# Patient Record
Sex: Male | Born: 1980 | Race: White | Hispanic: No | Marital: Married | Smoking: Never smoker
Health system: Southern US, Community
[De-identification: ages and names within clinical notes are randomized; demographics above are authoritative.]

## PROBLEM LIST (undated history)

## (undated) DIAGNOSIS — IMO0002 Reserved for concepts with insufficient information to code with codable children: Secondary | ICD-10-CM

## (undated) DIAGNOSIS — C801 Malignant (primary) neoplasm, unspecified: Secondary | ICD-10-CM

## (undated) DIAGNOSIS — T7840XA Allergy, unspecified, initial encounter: Secondary | ICD-10-CM

## (undated) DIAGNOSIS — C8590 Non-Hodgkin lymphoma, unspecified, unspecified site: Secondary | ICD-10-CM

## (undated) DIAGNOSIS — Z85828 Personal history of other malignant neoplasm of skin: Secondary | ICD-10-CM

## (undated) DIAGNOSIS — E785 Hyperlipidemia, unspecified: Secondary | ICD-10-CM

## (undated) HISTORY — PX: COLONOSCOPY: SHX174

## (undated) HISTORY — DX: Malignant (primary) neoplasm, unspecified: C80.1

## (undated) HISTORY — DX: Non-Hodgkin lymphoma, unspecified, unspecified site: C85.90

## (undated) HISTORY — DX: Allergy, unspecified, initial encounter: T78.40XA

## (undated) HISTORY — DX: Reserved for concepts with insufficient information to code with codable children: IMO0002

## (undated) HISTORY — DX: Personal history of other malignant neoplasm of skin: Z85.828

## (undated) HISTORY — DX: Hyperlipidemia, unspecified: E78.5

## (undated) HISTORY — PX: VASECTOMY: SHX75

---

## 2009-10-24 ENCOUNTER — Encounter (INDEPENDENT_AMBULATORY_CARE_PROVIDER_SITE_OTHER): Payer: Self-pay | Admitting: *Deleted

## 2009-12-07 ENCOUNTER — Encounter (INDEPENDENT_AMBULATORY_CARE_PROVIDER_SITE_OTHER): Payer: Self-pay | Admitting: *Deleted

## 2010-05-09 ENCOUNTER — Ambulatory Visit: Payer: Self-pay | Admitting: Internal Medicine

## 2010-09-12 NOTE — Letter (Signed)
Summary: New Patient letter  [facility]  [address] [place], [place] [address]   Phone: [phone]  Fax: [phone]       10/24/2009 MRN: [id number]  Allen Hodge [address] [place], [place]  [address]  Dear [name],  Welcome to the Gastroenterology Division at [facility].    You are scheduled to see Dr. Christella Hartigan on 11/22/2009 at 1:30PM on the 3rd floor at [facility], [address]  We ask that you try to arrive at our office 15 minutes prior to your appointment time to allow for check-in.  We would like you to complete the enclosed self-administered evaluation form prior to your visit and bring it with you on the day of your appointment.  We will review it with you.  Also, please bring a complete list of all your medications or, if you prefer, bring the medication bottles and we will list them.  Please bring your insurance card so that we may make a copy of it.  If your insurance requires a referral to see a specialist, please bring your referral form from your primary care physician.  Co-payments are due at the time of your visit and may be paid by cash, check or credit card.     Your office visit will consist of a consult with your physician (includes a physical exam), any laboratory testing he/she may order, scheduling of any necessary diagnostic testing (e.g. x-ray, ultrasound, CT-scan), and scheduling of a procedure (e.g. Endoscopy, Colonoscopy) if required.  Please allow enough time on your schedule to allow for any/all of these possibilities.    If you cannot keep your appointment, please call [phone] to cancel or reschedule prior to your appointment date.  This allows [place] the opportunity to schedule an appointment for another patient in need of care.  If you do not cancel or reschedule by 5 p.m. the business day prior to your appointment date, you will be charged a $50.00 late cancellation/no-show fee.    Thank you  for choosing [facility] for your medical needs.  We appreciate the opportunity to care for you.  Please visit [place] at our website  to learn more about our practice.                     Sincerely,                                                             The Gastroenterology Division

## 2010-09-12 NOTE — Letter (Signed)
Summary: Appt Reminder 2  [facility]  [address] [place], [place] [address]   Phone: [phone]  Fax: [phone]        December 07, 2009 MRN: [id number]    Allen Hodge [address] [place], [place]  [address]    Dear [name],   You have an appointment with Dr. Lina Sar on February 14, 2010 at 2:45pm.  Please remember to bring a complete list of the medicines you are taking, your insurance card and your co-pay.  If you have to cancel or reschedule this appointment, please call before 5:00 pm the evening before to avoid a cancellation fee.  If you have any questions or concerns, please call [phone].    Sincerely,  The Gastroenterology Division

## 2012-02-27 ENCOUNTER — Encounter: Payer: Self-pay | Admitting: Gastroenterology

## 2012-03-17 ENCOUNTER — Telehealth: Payer: Self-pay | Admitting: Gastroenterology

## 2012-03-17 NOTE — Telephone Encounter (Signed)
Pt was scheduled to see Dr Christella Hartigan on 11/22/09 pt  cx the appt  then was scheduled to see Dr Juanda Chance on 02/14/10 and 11/22/09 and no showed both appt's. Pt is now calling to speak with a nurse about a colon due to family history.  Dr Christella Hartigan should he be on your schedule?

## 2012-03-18 NOTE — Telephone Encounter (Signed)
New GI appt with me to discuss family history, consider colonoscopy.

## 2012-03-18 NOTE — Telephone Encounter (Signed)
Pt has been notified that he can be scheduled with Dr Christella Hartigan to discuss his concerns.  He will call and make that appt at a later date

## 2012-03-26 ENCOUNTER — Encounter: Payer: Self-pay | Admitting: Gastroenterology

## 2012-03-26 ENCOUNTER — Ambulatory Visit (INDEPENDENT_AMBULATORY_CARE_PROVIDER_SITE_OTHER): Payer: BC Managed Care – PPO | Admitting: Gastroenterology

## 2012-03-26 ENCOUNTER — Ambulatory Visit: Payer: Self-pay | Admitting: Gastroenterology

## 2012-03-26 VITALS — BP 128/74 | HR 62 | Ht 74.0 in | Wt 219.0 lb

## 2012-03-26 DIAGNOSIS — R194 Change in bowel habit: Secondary | ICD-10-CM

## 2012-03-26 DIAGNOSIS — R198 Other specified symptoms and signs involving the digestive system and abdomen: Secondary | ICD-10-CM

## 2012-03-26 DIAGNOSIS — Z800 Family history of malignant neoplasm of digestive organs: Secondary | ICD-10-CM | POA: Insufficient documentation

## 2012-03-26 MED ORDER — MOVIPREP 100 G PO SOLR: 1.0000 | ORAL | Status: DC

## 2012-03-26 NOTE — Patient Instructions (Addendum)
You will be set up for a colonoscopy for change in bowels, family history of colon cancer.

## 2012-03-26 NOTE — Progress Notes (Signed)
HPI: This is a    very pleasant 31 year old man whom I am meeting for the first time today.  His mother had colon cancer at age 81 died from it.  Paternal GF also had colon cancer also.   HE has had trouble with constipation, abd discomfort and gas pains.  LUQ gas like pains.    Constipation is definitely new for him.  For two months.  Never sees blood in stool.  Has been reading on internet.  Overall stable weight.  Daughter 38 years old.  No nsaids, no nausea, vomiting.  Also a sinking feeling in stomach, sort of a turning feeling.  Intermittent pyrosis.  No dysphagia.    Review of systems: Pertinent positive and negative review of systems were noted in the above HPI section. Complete review of systems was performed and was otherwise normal.    Past Medical History  Diagnosis Date  . Herniated disc   . GERD (gastroesophageal reflux disease)   . Hx of skin cancer, basal cell     No past surgical history on file.  Current Outpatient Prescriptions  Medication Sig Dispense Refill  . VITAMIN E PO Take 1 capsule by mouth daily.        Allergies as of 03/26/2012  . (No Known Allergies)    Family History  Problem Relation Age of Onset  . Colon cancer Mother 109  . Colon cancer Paternal Grandfather   . Colon polyps Mother   . Prostate cancer Maternal Grandfather   . Breast cancer Maternal Aunt   . Diabetes Maternal Grandmother     History   Social History  . Marital Status: Married    Spouse Name: N/A    Number of Children: 1  . Years of Education: N/A   Occupational History  . Sales     Social History Main Topics  . Smoking status: Never Smoker   . Smokeless tobacco: Never Used  . Alcohol Use: Yes     Rare   . Drug Use: No  . Sexually Active: Not on file   Other Topics Concern  . Not on file   Social History Narrative   Daily caffeine        Physical Exam: BP 128/74  Pulse 62  Ht 6\' 2"  (1.88 m)  Wt 219 lb (99.338 kg)  BMI 28.12  kg/m2 Constitutional: generally well-appearing Psychiatric: alert and oriented x3 Eyes: extraocular movements intact Mouth: oral pharynx moist, no lesions Neck: supple no lymphadenopathy Cardiovascular: heart regular rate and rhythm Lungs: clear to auscultation bilaterally Abdomen: soft, nontender, nondistended, no obvious ascites, no peritoneal signs, normal bowel sounds Extremities: no lower extremity edema bilaterally Skin: no lesions on visible extremities    Assessment and plan: 31 y.o. male with  elevated risk for colon cancer, recent change in bowel habits  I think it is unlikely that he has underlying colon cancer however with his family history and his change in bowel habits we should proceed with colonoscopy at his soonest convenience. He understands the potential risks, benefits of the procedure and wishes to proceed.

## 2012-05-02 ENCOUNTER — Other Ambulatory Visit: Payer: BC Managed Care – PPO | Admitting: Gastroenterology

## 2012-05-20 ENCOUNTER — Ambulatory Visit (AMBULATORY_SURGERY_CENTER): Payer: BC Managed Care – PPO | Admitting: Gastroenterology

## 2012-05-20 ENCOUNTER — Encounter: Payer: Self-pay | Admitting: Gastroenterology

## 2012-05-20 VITALS — BP 117/61 | HR 68 | Temp 98.1°F | Resp 13 | Ht 74.0 in | Wt 219.0 lb

## 2012-05-20 DIAGNOSIS — K5900 Constipation, unspecified: Secondary | ICD-10-CM

## 2012-05-20 DIAGNOSIS — Z800 Family history of malignant neoplasm of digestive organs: Secondary | ICD-10-CM

## 2012-05-20 DIAGNOSIS — R198 Other specified symptoms and signs involving the digestive system and abdomen: Secondary | ICD-10-CM

## 2012-05-20 MED ORDER — SODIUM CHLORIDE 0.9 % IV SOLN: 500.0000 mL | INTRAVENOUS | Status: DC

## 2012-05-20 NOTE — Op Note (Signed)
[facility] [address] [place], [address]   COLONOSCOPY PROCEDURE REPORT  PATIENT: Allen Hodge, Allen Hodge  MR#: [id number] BIRTHDATE: 27-Jul-1981 , 31  yrs. old GENDER: Male ENDOSCOPIST: Rachael Fee, MD REFERRED QI:ONGE Larwance Sachs, M.D. PROCEDURE DATE:  05/20/2012 PROCEDURE:   Colonoscopy, diagnostic ASA CLASS:   Class I INDICATIONS:elevated risk screening and mother with colon cancer, constipation. MEDICATIONS: Fentanyl 100 mcg IV, Versed 8 mg IV, and These medications were titrated to patient response per physician's verbal order  DESCRIPTION OF PROCEDURE:   After the risks benefits and alternatives of the procedure were thoroughly explained, informed consent was obtained.  A digital rectal exam revealed no abnormalities of the rectum.   The LB [id number]-[id number]  endoscope was introduced through the anus and advanced to the cecum, which was identified by both the appendix and ileocecal valve. No adverse events experienced.   The quality of the prep was good.  The instrument was then slowly withdrawn as the colon was fully examined.   COLON FINDINGS: A normal appearing cecum, ileocecal valve, and appendiceal orifice were identified.  The ascending, hepatic flexure, transverse, splenic flexure, descending, sigmoid colon and rectum appeared unremarkable.  No polyps or cancers were seen. Retroflexed views revealed no abnormalities. The time to cecum=5 minutes 28 seconds.  Withdrawal time=6 minutes 13 seconds.  The scope was withdrawn and the procedure completed. COMPLICATIONS: There were no complications.  ENDOSCOPIC IMPRESSION: Normal colon  RECOMMENDATIONS: Given your significant family history of colon cancer, you should have a repeat colonoscopy in 5 years   eSigned:  Rachael Fee, MD 05/20/2012 2:16 PM   c

## 2012-05-20 NOTE — Progress Notes (Signed)
The pt. Tolerated the colonoscopy well. Maw

## 2012-05-20 NOTE — Patient Instructions (Addendum)
YOU HAD AN ENDOSCOPIC PROCEDURE TODAY AT THE [facility]: Refer to the procedure report that was given to you for any specific questions about what was found during the examination.  If the procedure report does not answer your questions, please call your gastroenterologist to clarify.  If you requested that your care partner not be given the details of your procedure findings, then the procedure report has been included in a sealed envelope for you to review at your convenience later.  YOU SHOULD EXPECT: Some feelings of bloating in the abdomen. Passage of more gas than usual.  Walking can help get rid of the air that was put into your GI tract during the procedure and reduce the bloating. If you had a lower endoscopy (such as a colonoscopy or flexible sigmoidoscopy) you may notice spotting of blood in your stool or on the toilet paper. If you underwent a bowel prep for your procedure, then you may not have a normal bowel movement for a few days.  DIET: Your first meal following the procedure should be a light meal and then it is ok to progress to your normal diet.  A half-sandwich or bowl of soup is an example of a good first meal.  Heavy or fried foods are harder to digest and may make you feel nauseous or bloated.  Likewise meals heavy in dairy and vegetables can cause extra gas to form and this can also increase the bloating.  Drink plenty of fluids but you should avoid alcoholic beverages for 24 hours.  ACTIVITY: Your care partner should take you home directly after the procedure.  You should plan to take it easy, moving slowly for the rest of the day.  You can resume normal activity the day after the procedure however you should NOT DRIVE or use heavy machinery for 24 hours (because of the sedation medicines used during the test).    SYMPTOMS TO REPORT IMMEDIATELY: A gastroenterologist can be reached at any hour.  During normal business hours, 8:30 AM to 5:00 PM Monday through Friday,  call [phone].  After hours and on weekends, please call the GI answering service at [phone] who will take a message and have the physician on call contact you.   Following lower endoscopy (colonoscopy or flexible sigmoidoscopy):  Excessive amounts of blood in the stool  Significant tenderness or worsening of abdominal pains  Swelling of the abdomen that is new, acute  Fever of 100F or higher  FOLLOW UP: If any biopsies were taken you will be contacted by phone or by letter within the next 1-3 weeks.  Call your gastroenterologist if you have not heard about the biopsies in 3 weeks.  Our staff will call the home number listed on your records the next business day following your procedure to check on you and address any questions or concerns that you may have at that time regarding the information given to you following your procedure. This is a courtesy call and so if there is no answer at the home number and we have not heard from you through the emergency physician on call, we will assume that you have returned to your regular daily activities without incident.  SIGNATURES/CONFIDENTIALITY: You and/or your care partner have signed paperwork which will be entered into your electronic medical record.  These signatures attest to the fact that that the information above on your After Visit Summary has been reviewed and is understood.  Full responsibility of the confidentiality of this   discharge information lies with you and/or your care-partner.  Resume medications. 

## 2012-05-20 NOTE — Progress Notes (Signed)
The pt had abdominal cramping with the scope advancement.  Medications were titrated per the MD's orders.  Once the cecum was reached the pt relaxed and rested comfortable.  Maw

## 2012-05-20 NOTE — Progress Notes (Signed)
Patient did not experience any of the following events: a burn prior to discharge; a fall within the facility; wrong site/side/patient/procedure/implant event; or a hospital transfer or hospital admission upon discharge from the facility. (G8907) Patient did not have preoperative order for IV antibiotic SSI prophylaxis. (G8918)  

## 2012-05-21 ENCOUNTER — Telehealth: Payer: Self-pay

## 2012-05-21 NOTE — Telephone Encounter (Signed)
  Follow up Call-  Call back number 05/20/2012  Post procedure Call Back phone  # [phone]  Permission to leave phone message Yes     Patient questions:  Do you have a fever, pain , or abdominal swelling? no Pain Score  0 *  Have you tolerated food without any problems? yes  Have you been able to return to your normal activities? yes  Do you have any questions about your discharge instructions: Diet   no Medications  no Follow up visit  no  Do you have questions or concerns about your Care? no  Actions: * If pain score is 4 or above: No action needed, pain <4.   Per the pt "everything went fine". Maw

## 2013-09-16 ENCOUNTER — Other Ambulatory Visit: Payer: Self-pay | Admitting: Cardiology

## 2013-09-16 ENCOUNTER — Ambulatory Visit
Admission: RE | Admit: 2013-09-16 | Discharge: 2013-09-16 | Disposition: A | Discharge status: Home / Self Care | Payer: 59 | Source: Ambulatory Visit | Attending: Cardiology | Admitting: Cardiology

## 2013-09-16 DIAGNOSIS — R0789 Other chest pain: Secondary | ICD-10-CM

## 2016-02-15 ENCOUNTER — Emergency Department (HOSPITAL_COMMUNITY): Payer: 59

## 2016-02-15 ENCOUNTER — Emergency Department (HOSPITAL_COMMUNITY)
Admission: EM | Admit: 2016-02-15 | Discharge: 2016-02-15 | Disposition: A | Discharge status: Home / Self Care | Payer: 59 | Attending: Emergency Medicine | Admitting: Emergency Medicine

## 2016-02-15 ENCOUNTER — Encounter (HOSPITAL_COMMUNITY): Payer: Self-pay | Admitting: Emergency Medicine

## 2016-02-15 DIAGNOSIS — R200 Anesthesia of skin: Secondary | ICD-10-CM

## 2016-02-15 DIAGNOSIS — Z85828 Personal history of other malignant neoplasm of skin: Secondary | ICD-10-CM | POA: Insufficient documentation

## 2016-02-15 DIAGNOSIS — R202 Paresthesia of skin: Secondary | ICD-10-CM
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
## 2016-02-15 NOTE — Discharge Instructions (Signed)
Paresthesia Paresthesia is an abnormal burning or prickling sensation. This sensation is generally felt in the hands, arms, legs, or feet. However, it may occur in any part of the body. Usually, it is not painful. The feeling may be described as:  Tingling or numbness.  Pins and needles.  Skin crawling.  Buzzing.  Limbs falling asleep.  Itching. Most people experience temporary (transient) paresthesia at some time in their lives. Paresthesia may occur when you breathe too quickly (hyperventilation). It can also occur without any apparent cause. Commonly, paresthesia occurs when pressure is placed on a nerve. The sensation quickly goes away after the pressure is removed. For some people, however, paresthesia is a long-lasting (chronic) condition that is caused by an underlying disorder. If you continue to have paresthesia, you may need further medical evaluation. HOME CARE INSTRUCTIONS Watch your condition for any changes. Taking the following actions may help to lessen any discomfort that you are feeling:  Avoid drinking alcohol.  Try acupuncture or massage to help relieve your symptoms.  Keep all follow-up visits as directed by your health care provider. This is important. SEEK MEDICAL CARE IF:  You continue to have episodes of paresthesia.  Your burning or prickling feeling gets worse when you walk.  You have pain, cramps, or dizziness.  You develop a rash. SEEK IMMEDIATE MEDICAL CARE IF:  You feel weak.  You have trouble walking or moving.  You have problems with speech, understanding, or vision.  You feel confused.  You cannot control your bladder or bowel movements.  You have numbness after an injury.  You faint.   This information is not intended to replace advice given to you by your health care provider. Make sure you discuss any questions you have with your health care provider.   Follow up with your primary care provider for re-evaluation. Return to the Ed  if you experience severe worsening of your symptoms, severe eye pain or change in vision, fevers, weakness, slurred speech or loss of consciousness.

## 2016-02-15 NOTE — ED Notes (Signed)
Pt taken to CT scan.
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
## 2016-02-15 NOTE — ED Notes (Signed)
Patient has returned from CT, and walked to bathroom without assist.

## 2016-02-15 NOTE — ED Notes (Signed)
Neuro rounding in room.
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
## 2016-02-20 ENCOUNTER — Emergency Department (HOSPITAL_COMMUNITY)
Admission: EM | Admit: 2016-02-20 | Discharge: 2016-02-20 | Disposition: A | Discharge status: Home / Self Care | Payer: 59 | Attending: Emergency Medicine | Admitting: Emergency Medicine

## 2016-02-20 ENCOUNTER — Encounter (HOSPITAL_COMMUNITY): Payer: Self-pay

## 2016-02-20 ENCOUNTER — Emergency Department (HOSPITAL_COMMUNITY): Payer: 59

## 2016-02-20 DIAGNOSIS — R002 Palpitations: Secondary | ICD-10-CM | POA: Diagnosis not present

## 2016-02-20 DIAGNOSIS — R202 Paresthesia of skin: Secondary | ICD-10-CM

## 2016-02-20 DIAGNOSIS — R200 Anesthesia of skin: Secondary | ICD-10-CM

## 2016-02-20 DIAGNOSIS — R0602 Shortness of breath: Secondary | ICD-10-CM | POA: Insufficient documentation

## 2016-02-20 LAB — HEPATIC FUNCTION PANEL
ALBUMIN: 4.9 g/dL (ref 3.5–5.0)
ALK PHOS: 39 U/L (ref 38–126)
ALT: 18 U/L (ref 17–63)
AST: 20 U/L (ref 15–41)
BILIRUBIN INDIRECT: 0.8 mg/dL (ref 0.3–0.9)
BILIRUBIN TOTAL: 0.9 mg/dL (ref 0.3–1.2)
Bilirubin, Direct: 0.1 mg/dL (ref 0.1–0.5)
TOTAL PROTEIN: 7.6 g/dL (ref 6.5–8.1)

## 2016-02-20 LAB — TSH: TSH: 1.03 u[IU]/mL (ref 0.350–4.500)

## 2016-02-20 LAB — URINALYSIS, ROUTINE W REFLEX MICROSCOPIC
BILIRUBIN URINE: NEGATIVE
GLUCOSE, UA: NEGATIVE mg/dL
Hgb urine dipstick: NEGATIVE
KETONES UR: NEGATIVE mg/dL
Leukocytes, UA: NEGATIVE
NITRITE: NEGATIVE
PH: 7 (ref 5.0–8.0)
Protein, ur: NEGATIVE mg/dL
Specific Gravity, Urine: 1.009 (ref 1.005–1.030)

## 2016-02-20 LAB — CBC
HEMATOCRIT: 46.7 % (ref 39.0–52.0)
Hemoglobin: 15.6 g/dL (ref 13.0–17.0)
MCH: 29.5 pg (ref 26.0–34.0)
MCHC: 33.4 g/dL (ref 30.0–36.0)
MCV: 88.4 fL (ref 78.0–100.0)
Platelets: 238 10*3/uL (ref 150–400)
RBC: 5.28 MIL/uL (ref 4.22–5.81)
RDW: 13.1 % (ref 11.5–15.5)
WBC: 7 10*3/uL (ref 4.0–10.5)

## 2016-02-20 LAB — BASIC METABOLIC PANEL
Anion gap: 7 (ref 5–15)
BUN: 11 mg/dL (ref 6–20)
CO2: 27 mmol/L (ref 22–32)
Calcium: 10.1 mg/dL (ref 8.9–10.3)
Chloride: 104 mmol/L (ref 101–111)
Creatinine, Ser: 1.01 mg/dL (ref 0.61–1.24)
GFR calc Af Amer: >60 mL/min (ref >60)
GLUCOSE: 100 mg/dL — AB (ref 65–99)
POTASSIUM: 4.1 mmol/L (ref 3.5–5.1)
Sodium: 138 mmol/L (ref 135–145)

## 2016-02-20 LAB — T4, FREE: Free T4: 1.01 ng/dL (ref 0.61–1.12)

## 2016-02-20 LAB — I-STAT TROPONIN, ED: Troponin i, poc: 0 ng/mL (ref 0.00–0.08)

## 2016-02-20 LAB — CK: Total CK: 112 U/L (ref 49–397)

## 2016-02-20 LAB — MAGNESIUM: MAGNESIUM: 2.1 mg/dL (ref 1.7–2.4)

## 2016-02-20 LAB — LIPASE, BLOOD: Lipase: 52 U/L — ABNORMAL HIGH (ref 11–51)

## 2016-02-20 NOTE — ED Notes (Signed)
Patient complains of ongoing body numbness and palpitations since visit 7/5. Has ben see her for same, has been at urgent care for same. Wants work-up this time. On arrival NAD. Speaking complete sentences, steady gait into triage. Alert and oriernted

## 2016-02-20 NOTE — ED Notes (Signed)
Gave pt [place] sandwich and applesauce, per [place] - RN.

## 2016-02-20 NOTE — ED Provider Notes (Signed)
CSN: [id number]     Arrival date & time 02/20/16  1013 History   First MD Initiated Contact with Patient 02/20/16 1037     Chief Complaint  Patient presents with  . body numbness   . Palpitations     (Consider location/radiation/quality/duration/timing/severity/associated sxs/prior Treatment) HPI  35 year old male with no significant medical history presents with concern for full body loss of pain sensation, episodes of intermittent numbness, headache, brain fog, palpitations, shortness of breath, chest tightness. Patient was seen on July 5 for symptoms of loss of pain sensation to his whole body. It he had a neurology evaluation, with recommendation of obtaining outpatient B12, TSH, magnesium and calcium levels, and low suspicion for acute intracranial or spinal pathology given distribution.   Patient with list of symptoms that he reads from his phone:  Yesterday heart palpitations and today, consistent yesterday, come and go, even notice when laying down.  Shortness of breath this AM, comes and goes. Nothing makes it better or worse. Comes and goes randomly.  Tightness in chest comes and goes since yesterday, problbably had it before but noticed yesterday. Not changed by positions, exertion.  Still has feeling of numbness diffusely, all over his body. Went into walk in clinic.  Tightness in chest, tick bite back in April, difficulty swallowing today while drinking water, feels numbness in throat, fatigue, slight motor changes-walking differently, feels like fingers on both sides are not working normally. Saturday left leg was asleep all day.  Labored breathing. Headache. Brain fog at times, feels sharp at times.  Nose bleeds. Wobbly legs. Waking up tired. Slurred speech. Always been healthy.  Numbness has been main thing, consistent thing, everywhere.  Nosebleed out of left nostril. Has had nose itching.  No new medicines.  Taking B12 supplements   Went to walk in clinic and had lab work but does  not really have a PCP.  Have been under stress but tried to relax for last few days.     Sensitivity to gluten, weeks ago had sharp stabbing pain which went away, recently not having pain with gluten, not sure if related to numbness.     Past Medical History  Diagnosis Date  . Herniated disc   . Hx of skin cancer, basal cell   . Allergy    History reviewed. No pertinent past surgical history. Family History  Problem Relation Age of Onset  . Colon cancer Mother 59  . Colon polyps Mother   . Colon cancer Paternal Grandfather   . Prostate cancer Maternal Grandfather   . Breast cancer Maternal Aunt   . Diabetes Maternal Grandmother    Social History  Substance Use Topics  . Smoking status: Never Smoker   . Smokeless tobacco: Never Used  . Alcohol Use: 1.2 oz/week    2 Cans of beer per week     Comment: Rare     Review of Systems  Constitutional: Positive for activity change and fatigue. Negative for fever.  HENT: Negative for sore throat.   Eyes: Negative for visual disturbance.  Respiratory: Positive for shortness of breath. Negative for cough.   Cardiovascular: Positive for chest pain.  Gastrointestinal: Negative for abdominal pain.  Genitourinary: Negative for difficulty urinating.  Musculoskeletal: Negative for back pain and neck stiffness. Myalgias: muscles feel tight.  Skin: Negative for rash.  Neurological: Positive for speech difficulty, numbness and headaches. Negative for syncope, facial asymmetry and weakness.      Allergies  Review of patient's allergies indicates no known allergies.  Home Medications   Prior to Admission medications   Medication Sig Start Date End Date Taking? Authorizing Provider  acetaminophen (TYLENOL) 500 MG tablet Take 1,000 mg by mouth every 6 (six) hours as needed for mild pain.   Yes Historical Provider, MD  Cyanocobalamin ([facility] B-12 PO) Take 1 tablet by mouth daily.   Yes Historical Provider, MD  ibuprofen (ADVIL,MOTRIN) 200 MG  tablet Take 200 mg by mouth every 6 (six) hours as needed.   Yes Historical Provider, MD  Multiple Vitamins-Minerals (MULTIVITAMIN WITH MINERALS) tablet Take 1 tablet by mouth daily.   Yes Historical Provider, MD   BP 110/77 mmHg  Pulse 62  Temp(Src) 98.1 F (36.7 C) (Oral)  Resp 18  Ht 6\' 2"  (1.88 m)  Wt 200 lb (90.719 kg)  BMI 25.67 kg/m2  SpO2 100% Physical Exam  Constitutional: He is oriented to person, place, and time. He appears well-developed and well-nourished. No distress.  HENT:  Head: Normocephalic and atraumatic.  Mouth/Throat: No oropharyngeal exudate.  Eyes: Conjunctivae and EOM are normal. Pupils are equal, round, and reactive to light.  Neck: Normal range of motion.  Cardiovascular: Normal rate, regular rhythm, normal heart sounds and intact distal pulses.  Exam reveals no gallop and no friction rub.   No murmur heard. Pulmonary/Chest: Effort normal and breath sounds normal. No respiratory distress. He has no wheezes. He has no rales.  Abdominal: Soft. He exhibits no distension. There is no tenderness. There is no guarding.  Musculoskeletal: He exhibits no edema.  Neurological: He is alert and oriented to person, place, and time. He has normal strength. A sensory deficit (reports normal sensation to light touch, however denies pain sensation) is present. No cranial nerve deficit. Coordination and gait normal. GCS eye subscore is 4. GCS verbal subscore is 5. GCS motor subscore is 6.  Reflex Scores:      Bicep reflexes are 3+ on the right side and 3+ on the left side.      Patellar reflexes are 3+ on the right side and 3+ on the left side. Skin: Skin is warm and dry. He is not diaphoretic.  Nursing note and vitals reviewed.   ED Course  Procedures (including critical care time) Labs Review Labs Reviewed  BASIC METABOLIC PANEL - Abnormal; Notable for the following:    Glucose, Bld 100 (*)    All other components within normal limits  LIPASE, BLOOD - Abnormal;  Notable for the following:    Lipase 52 (*)    All other components within normal limits  CBC  URINALYSIS, ROUTINE W REFLEX MICROSCOPIC (NOT AT [facility])  TSH  MAGNESIUM  T4, FREE  CK  HEPATIC FUNCTION PANEL  B. BURGDORFI ANTIBODIES  I-STAT TROPOININ, ED    Imaging Review Dg Chest 2 View  02/20/2016  CLINICAL DATA:  Cardiac palpitations EXAM: CHEST  2 VIEW COMPARISON:  September 16, 2013 FINDINGS: There is no edema or consolidation. The heart size and pulmonary vascularity are normal. No adenopathy. No bone lesions. IMPRESSION: No edema or consolidation. Electronically Signed   By: Lowella Grip III M.D.   On: 02/20/2016 12:51   I have personally reviewed and evaluated these images and lab results as part of my medical decision-making.   EKG Interpretation   Date/Time:  Monday February 20 2016 10:19:57 EDT Ventricular Rate:  86 PR Interval:  132 QRS Duration: 94 QT Interval:  356 QTC Calculation: 426 R Axis:   96 Text Interpretation:  Normal sinus rhythm with sinus arrhythmia  Rightward  axis Incomplete right bundle branch block Borderline ECG No previous ECGs  available Confirmed by [facility] MD, Miia Blanks ([id number]) on 02/20/2016 1:38:51 PM      MDM   Final diagnoses:  Numbness and tingling  Palpitations  Shortness of breath   35 year old male with no significant medical history presents with concern for full body loss of pain sensation, episodes of intermittent numbness, headache, brain fog, palpitations, shortness of breath, chest tightness. Patient was seen on July 5 for symptoms of loss of pain sensation to his whole body. It he had a neurology evaluation, with recommendation of obtaining outpatient B12, TSH, magnesium and calcium levels, and low suspicion for acute intracranial or spinal pathology given distribution. Patient does not have a primary care physician, and presents again to the emergency department with these symptoms along with additional symptoms of shortness of  breath chest tightness and palpitations. EKG was about a by me and showed an incomplete right bundle branch block without other abnormalities and no prior for comparison. Chest x-ray shows no sign of acute abnormality. Troponin was negative. TSH, Ca, magnesium were ordered and were within normal limits.  Given muscle aches checked CK which was normal.  Have low suspicion for pulmonary embolus as etiology symptoms, and given multiple body systems described as symptoms, and patient is also PERC negative.  Discussed with patient that have low suspicion for acute pathology including stroke, spinal cord lesion, heart attack, PE.  No sign of GBS, myesthenia gravis, doubt ALS.    Discussed that symptoms he is describing may be attributed to stress and anxiety, and while he should continue outpatient evaluation, also recommend stress relief which he reports he is pursuing at this time. Patient asking for lyme testing. Discussed that overall I do not feel his symptoms are consistent with Lyme disease, and that testing for antibodies may result in a false positive that may not explain his symptoms, however patient would like to pursue this testing.  Will give number for outpt Neurology for further evaluation and recommend PCP follow up.       Gareth Morgan, MD 02/20/16 2025

## 2016-02-20 NOTE — ED Notes (Signed)
Placed patient on monitor into a gown

## 2016-02-21 LAB — B. BURGDORFI ANTIBODIES: B burgdorferi Ab IgG+IgM: <0.91 {ISR} (ref 0.00–0.90)

## 2016-02-22 ENCOUNTER — Telehealth: Payer: Self-pay | Admitting: Neurology

## 2016-02-22 ENCOUNTER — Other Ambulatory Visit: Payer: Self-pay | Admitting: Nurse Practitioner

## 2016-02-22 DIAGNOSIS — S060X0A Concussion without loss of consciousness, initial encounter: Secondary | ICD-10-CM

## 2016-02-22 NOTE — Telephone Encounter (Signed)
Diane: I got a call about this patient. He needs to be seen in outpatient in our practice. He was seen in the emergency room here at [facility] by neurology. I am on vacation the next 2 weeks so please schedule him with a different provider since I'm not in the office. His phone number is [phone]. Terrence Dupont, can you make sure that Shauna Hugh gets this or Inez Catalina gets this please thank you. thanks

## 2016-02-22 NOTE — Telephone Encounter (Signed)
Gave information to Diane G. In medical records to call patient to schedule appt.

## 2016-02-24 ENCOUNTER — Ambulatory Visit (INDEPENDENT_AMBULATORY_CARE_PROVIDER_SITE_OTHER): Payer: 59 | Admitting: Neurology

## 2016-02-24 ENCOUNTER — Encounter: Payer: Self-pay | Admitting: Neurology

## 2016-02-24 VITALS — BP 110/77 | HR 90 | Ht 74.0 in | Wt 200.8 lb

## 2016-02-24 DIAGNOSIS — R200 Anesthesia of skin: Secondary | ICD-10-CM

## 2016-02-24 DIAGNOSIS — F411 Generalized anxiety disorder: Secondary | ICD-10-CM | POA: Diagnosis not present

## 2016-02-24 NOTE — Patient Instructions (Signed)
-   your symptoms more consistent with stress and anxiety - try to relax and de-stress, which will help the most - will refer to psychology for stress management.  - let us know if you have additional symptoms and we may consider MRI at that time - follow up in 2 months
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
## 2016-04-04 NOTE — Progress Notes (Signed)
Receive fax from [facility] that pt decline to schedule appt at this time.

## 2016-04-30 ENCOUNTER — Ambulatory Visit (INDEPENDENT_AMBULATORY_CARE_PROVIDER_SITE_OTHER): Payer: 59 | Admitting: Neurology

## 2016-04-30 ENCOUNTER — Telehealth: Payer: Self-pay | Admitting: Neurology

## 2016-04-30 ENCOUNTER — Encounter: Payer: Self-pay | Admitting: Neurology

## 2016-04-30 VITALS — BP 101/65 | HR 69 | Ht 74.0 in | Wt 199.0 lb

## 2016-04-30 DIAGNOSIS — R200 Anesthesia of skin: Secondary | ICD-10-CM

## 2016-04-30 DIAGNOSIS — F411 Generalized anxiety disorder: Secondary | ICD-10-CM

## 2016-04-30 NOTE — Patient Instructions (Addendum)
-   symptoms more consistent with stress and anxiety - relaxation and de-stress, which will help the most - MRI brain with and without contrast  - will do nerve conduction study - consider psychology referral if needed in the future. - follow up in 3 months.
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
## 2016-05-03 ENCOUNTER — Telehealth: Payer: Self-pay | Admitting: Neurology

## 2016-05-03 NOTE — Telephone Encounter (Signed)
Sandi, would you mind calling this patient to let him know his MRI has been sent to [facility]?

## 2016-05-08 NOTE — Telephone Encounter (Signed)
Noted  

## 2016-06-07 ENCOUNTER — Encounter: Payer: 59 | Admitting: Diagnostic Neuroimaging

## 2016-07-12 ENCOUNTER — Ambulatory Visit: Payer: 59 | Admitting: Neurology

## 2017-06-17 ENCOUNTER — Encounter: Payer: Self-pay | Admitting: Gastroenterology
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
## 2018-08-20 ENCOUNTER — Encounter: Payer: Self-pay | Admitting: Gastroenterology

## 2018-09-08 ENCOUNTER — Ambulatory Visit (AMBULATORY_SURGERY_CENTER): Payer: Self-pay | Admitting: *Deleted

## 2018-09-08 VITALS — Ht 74.0 in | Wt 189.0 lb

## 2018-09-08 DIAGNOSIS — Z800 Family history of malignant neoplasm of digestive organs: Secondary | ICD-10-CM

## 2018-09-08 MED ORDER — PEG 3350-KCL-NA BICARB-NACL 420 G PO SOLR: 4000.0000 mL | Freq: Once | ORAL | 0 refills | Status: AC

## 2018-09-08 NOTE — Progress Notes (Signed)
No egg or soy allergy known to patient  No issues with past sedation with any surgeries  or procedures, no intubation problems  No diet pills per patient No home 02 use per patient  No blood thinners per patient  Pt denies issues with constipation  No A fib or A flutter  EMMI video sent to pt's e mail pt declined   

## 2018-09-09 ENCOUNTER — Encounter: Payer: Self-pay | Admitting: Gastroenterology

## 2018-09-22 ENCOUNTER — Ambulatory Visit (AMBULATORY_SURGERY_CENTER): Payer: 59 | Admitting: Gastroenterology

## 2018-09-22 ENCOUNTER — Encounter: Payer: Self-pay | Admitting: Gastroenterology

## 2018-09-22 VITALS — BP 101/59 | HR 64 | Temp 97.5°F | Resp 10 | Ht 74.0 in | Wt 189.0 lb

## 2018-09-22 DIAGNOSIS — Z1211 Encounter for screening for malignant neoplasm of colon: Secondary | ICD-10-CM | POA: Diagnosis not present

## 2018-09-22 DIAGNOSIS — D124 Benign neoplasm of descending colon: Secondary | ICD-10-CM | POA: Diagnosis not present

## 2018-09-22 DIAGNOSIS — Z800 Family history of malignant neoplasm of digestive organs: Secondary | ICD-10-CM | POA: Diagnosis present

## 2018-09-22 MED ORDER — SODIUM CHLORIDE 0.9 % IV SOLN: 500.0000 mL | Freq: Once | INTRAVENOUS | Status: DC

## 2018-09-22 NOTE — Op Note (Signed)
[facility] Patient Name: Arben Packman Procedure Date: 09/22/2018 9:56 AM MRN: [id number] Endoscopist: Milus Banister , MD Age: 38 Referring MD:  Date of Birth: [birth year] Gender: Male Account #: [id number] Procedure:                Colonoscopy Indications:              Screening in patient at increased risk: Family                            history of 1st-degree relative with colorectal                            cancer (mother diagnosed around age 80) Medicines:                Monitored Anesthesia Care Procedure:                Pre-Anesthesia Assessment:                           - Prior to the procedure, a History and Physical                            was performed, and patient medications and                            allergies were reviewed. The patient's tolerance of                            previous anesthesia was also reviewed. The risks                            and benefits of the procedure and the sedation                            options and risks were discussed with the patient.                            All questions were answered, and informed consent                            was obtained. Prior Anticoagulants: The patient has                            taken no previous anticoagulant or antiplatelet                            agents. ASA Grade Assessment: II - A patient with                            mild systemic disease. After reviewing the risks                            and benefits, the patient was deemed in  satisfactory condition to undergo the procedure.                           After obtaining informed consent, the colonoscope                            was passed under direct vision. Throughout the                            procedure, the patient's blood pressure, pulse, and                            oxygen saturations were monitored continuously. The                            Colonoscope was  introduced through the anus and                            advanced to the the cecum, identified by                            appendiceal orifice and ileocecal valve. The                            colonoscopy was performed without difficulty. The                            patient tolerated the procedure well. The quality                            of the bowel preparation was good. The ileocecal                            valve, appendiceal orifice, and rectum were                            photographed. Scope In: 10:16:47 AM Scope Out: 10:32:16 AM Scope Withdrawal Time: 0 hours 9 minutes 21 seconds  Total Procedure Duration: 0 hours 15 minutes 29 seconds  Findings:                 A 3 mm polyp was found in the descending colon. The                            polyp was sessile. The polyp was removed with a                            cold snare. Resection and retrieval were complete.                           The exam was otherwise without abnormality on                            direct and retroflexion views. Complications:  No immediate complications. Estimated blood loss:                            None. Estimated Blood Loss:     Estimated blood loss: none. Impression:               - One 3 mm polyp in the descending colon, removed                            with a cold snare. Resected and retrieved.                           - The examination was otherwise normal on direct                            and retroflexion views. Recommendation:           - Patient has a contact number available for                            emergencies. The signs and symptoms of potential                            delayed complications were discussed with the                            patient. Return to normal activities tomorrow.                            Written discharge instructions were provided to the                            patient.                           - Resume previous  diet.                           - Continue present medications.                           You will receive a letter within 2-3 weeks with the                            pathology results and my final recommendations.                           If the polyp(s) is proven to be 'pre-cancerous' on                            pathology, you will need repeat colonoscopy in 5                            years. Milus Banister, MD 09/22/2018 10:34:12 AM This report has been signed electronically.

## 2018-09-22 NOTE — Progress Notes (Signed)
Report given to PACU, vss 

## 2018-09-22 NOTE — Progress Notes (Signed)
Called to room to assist during endoscopic procedure.  Patient ID and intended procedure confirmed with present staff. Received instructions for my participation in the procedure from the performing physician.  

## 2018-09-22 NOTE — Patient Instructions (Signed)
Handout given for polyps.  YOU HAD AN ENDOSCOPIC PROCEDURE TODAY AT THE [facility]:   Refer to the procedure report that was given to you for any specific questions about what was found during the examination.  If the procedure report does not answer your questions, please call your gastroenterologist to clarify.  If you requested that your care partner not be given the details of your procedure findings, then the procedure report has been included in a sealed envelope for you to review at your convenience later.  YOU SHOULD EXPECT: Some feelings of bloating in the abdomen. Passage of more gas than usual.  Walking can help get rid of the air that was put into your GI tract during the procedure and reduce the bloating. If you had a lower endoscopy (such as a colonoscopy or flexible sigmoidoscopy) you may notice spotting of blood in your stool or on the toilet paper. If you underwent a bowel prep for your procedure, you may not have a normal bowel movement for a few days.  Please Note:  You might notice some irritation and congestion in your nose or some drainage.  This is from the oxygen used during your procedure.  There is no need for concern and it should clear up in a day or so.  SYMPTOMS TO REPORT IMMEDIATELY:   Following lower endoscopy (colonoscopy or flexible sigmoidoscopy):  Excessive amounts of blood in the stool  Significant tenderness or worsening of abdominal pains  Swelling of the abdomen that is new, acute  Fever of 100F or higher  For urgent or emergent issues, a gastroenterologist can be reached at any hour by calling [phone].   DIET:  We do recommend a small meal at first, but then you may proceed to your regular diet.  Drink plenty of fluids but you should avoid alcoholic beverages for 24 hours.  ACTIVITY:  You should plan to take it easy for the rest of today and you should NOT DRIVE or use heavy machinery until tomorrow (because of the sedation  medicines used during the test).    FOLLOW UP: Our staff will call the number listed on your records the next business day following your procedure to check on you and address any questions or concerns that you may have regarding the information given to you following your procedure. If we do not reach you, we will leave a message.  However, if you are feeling well and you are not experiencing any problems, there is no need to return our call.  We will assume that you have returned to your regular daily activities without incident.  If any biopsies were taken you will be contacted by phone or by letter within the next 1-3 weeks.  Please call us at [phone] if you have not heard about the biopsies in 3 weeks.    SIGNATURES/CONFIDENTIALITY: You and/or your care partner have signed paperwork which will be entered into your electronic medical record.  These signatures attest to the fact that that the information above on your After Visit Summary has been reviewed and is understood.  Full responsibility of the confidentiality of this discharge information lies with you and/or your care-partner. 

## 2018-09-23 ENCOUNTER — Telehealth: Payer: Self-pay

## 2018-09-23 NOTE — Telephone Encounter (Signed)
  Follow up Call-  Call back number 09/22/2018  Post procedure Call Back phone  # [phone]  Permission to leave phone message Yes  Some recent data might be hidden     Patient questions:  Do you have a fever, pain , or abdominal swelling? No. Pain Score  0 *  Have you tolerated food without any problems? Yes.    Have you been able to return to your normal activities? Yes.    Do you have any questions about your discharge instructions: Diet   No. Medications  No. Follow up visit  No.  Do you have questions or concerns about your Care? No.  Actions: * If pain score is 4 or above: No action needed, pain <4.

## 2018-09-26 ENCOUNTER — Encounter: Payer: Self-pay | Admitting: Gastroenterology

## 2019-06-15 ENCOUNTER — Other Ambulatory Visit: Payer: Self-pay

## 2019-06-15 DIAGNOSIS — Z20822 Contact with and (suspected) exposure to covid-19: Secondary | ICD-10-CM

## 2019-06-16 LAB — NOVEL CORONAVIRUS, NAA: SARS-CoV-2, NAA: NOT DETECTED

## 2021-06-15 ENCOUNTER — Encounter: Payer: Self-pay | Admitting: Physician Assistant

## 2021-06-15 ENCOUNTER — Ambulatory Visit (INDEPENDENT_AMBULATORY_CARE_PROVIDER_SITE_OTHER): Payer: No Typology Code available for payment source | Admitting: Physician Assistant

## 2021-06-15 ENCOUNTER — Other Ambulatory Visit (INDEPENDENT_AMBULATORY_CARE_PROVIDER_SITE_OTHER): Payer: No Typology Code available for payment source

## 2021-06-15 VITALS — BP 110/70 | HR 78 | Ht 74.0 in | Wt 201.2 lb

## 2021-06-15 DIAGNOSIS — R1012 Left upper quadrant pain: Secondary | ICD-10-CM

## 2021-06-15 DIAGNOSIS — R110 Nausea: Secondary | ICD-10-CM

## 2021-06-15 DIAGNOSIS — R1084 Generalized abdominal pain: Secondary | ICD-10-CM

## 2021-06-15 DIAGNOSIS — R1010 Upper abdominal pain, unspecified: Secondary | ICD-10-CM

## 2021-06-15 LAB — CBC WITH DIFFERENTIAL/PLATELET
Basophils Absolute: 0 10*3/uL (ref 0.0–0.1)
Basophils Relative: 0.6 % (ref 0.0–3.0)
Eosinophils Absolute: 0.2 10*3/uL (ref 0.0–0.7)
Eosinophils Relative: 3.4 % (ref 0.0–5.0)
HCT: 40.2 % (ref 39.0–52.0)
Hemoglobin: 13.6 g/dL (ref 13.0–17.0)
Lymphocytes Relative: 29.3 % (ref 12.0–46.0)
Lymphs Abs: 1.9 10*3/uL (ref 0.7–4.0)
MCHC: 33.9 g/dL (ref 30.0–36.0)
MCV: 87.2 fl (ref 78.0–100.0)
Monocytes Absolute: 0.6 10*3/uL (ref 0.1–1.0)
Monocytes Relative: 9.1 % (ref 3.0–12.0)
Neutro Abs: 3.7 10*3/uL (ref 1.4–7.7)
Neutrophils Relative %: 57.6 % (ref 43.0–77.0)
Platelets: 291 10*3/uL (ref 150.0–400.0)
RBC: 4.61 Mil/uL (ref 4.22–5.81)
RDW: 13.2 % (ref 11.5–15.5)
WBC: 6.5 10*3/uL (ref 4.0–10.5)

## 2021-06-15 LAB — COMPREHENSIVE METABOLIC PANEL
ALT: 10 U/L (ref 0–53)
AST: 12 U/L (ref 0–37)
Albumin: 4.4 g/dL (ref 3.5–5.2)
Alkaline Phosphatase: 48 U/L (ref 39–117)
BUN: 13 mg/dL (ref 6–23)
CO2: 28 mEq/L (ref 19–32)
Calcium: 9.6 mg/dL (ref 8.4–10.5)
Chloride: 103 mEq/L (ref 96–112)
Creatinine, Ser: 0.97 mg/dL (ref 0.40–1.50)
GFR: 97.7 mL/min (ref >60.00)
Glucose, Bld: 75 mg/dL (ref 70–99)
Potassium: 4 mEq/L (ref 3.5–5.1)
Sodium: 138 mEq/L (ref 135–145)
Total Bilirubin: 0.3 mg/dL (ref 0.2–1.2)
Total Protein: 7.2 g/dL (ref 6.0–8.3)

## 2021-06-15 LAB — C-REACTIVE PROTEIN: CRP: 2.4 mg/dL (ref 0.5–20.0)

## 2021-06-15 LAB — SEDIMENTATION RATE: Sed Rate: 35 mm/hr — ABNORMAL HIGH (ref 0–15)

## 2021-06-15 LAB — LIPASE: Lipase: 72 U/L — ABNORMAL HIGH (ref 11.0–59.0)

## 2021-06-15 MED ORDER — PANTOPRAZOLE SODIUM 40 MG PO TBEC: 40.0000 mg | DELAYED_RELEASE_TABLET | Freq: Every day | ORAL | 2 refills | Status: DC

## 2021-06-15 NOTE — Patient Instructions (Signed)
If you are age 40 or younger, your body mass index should be between 19-25. Your Body mass index is 25.83 kg/m. If this is out of the aformentioned range listed, please consider follow up with your Primary Care Provider.  ________________________________________________________  The [facility] providers would like to encourage you to use [facility] to communicate with providers for non-urgent requests or questions.  Due to long hold times on the telephone, sending your provider a message by [facility] may be a faster and more efficient way to get a response.  Please allow 48 business hours for a response.  Please remember that this is for non-urgent requests.  _______________________________________________________  Allen Hodge have been scheduled for a CT scan of the abdomen and pelvis at [facility] ([address]---this is in the same building as [employer]).   You are scheduled on 06/21/2021 at 3:30 pm. You should arrive 15 minutes prior to your appointment time for registration. Please follow the written instructions below on the day of your exam:  WARNING: IF YOU ARE ALLERGIC TO IODINE/X-RAY DYE, PLEASE NOTIFY RADIOLOGY IMMEDIATELY AT [phone]! YOU WILL BE GIVEN A 13 HOUR PREMEDICATION PREP.  1) Do not eat or drink anything after 11:30 am (4 hours prior to your test) 2) You have been given 2 bottles of oral contrast to drink. The solution may taste better if refrigerated, but do NOT add ice or any other liquid to this solution. Shake well before drinking.    Drink 1 bottle of contrast @ 1:30 pm (2 hours prior to your exam)  Drink 1 bottle of contrast @ 2:30 pm (1 hour prior to your exam)  You may take any medications as prescribed with a small amount of water, if necessary. If you take any of the following medications: METFORMIN, GLUCOPHAGE, GLUCOVANCE, AVANDAMET, RIOMET, FORTAMET, [place] MET, JANUMET, GLUMETZA or METAGLIP, you MAY be asked to HOLD this medication 48 hours  AFTER the exam.  The purpose of you drinking the oral contrast is to aid in the visualization of your intestinal tract. The contrast solution may cause some diarrhea. Depending on your individual set of symptoms, you may also receive an intravenous injection of x-ray contrast/dye. Plan on being at [facility] for 30 minutes or longer, depending on the type of exam you are having performed.  This test typically takes 30-45 minutes to complete.  If you have any questions regarding your exam or if you need to reschedule, you may call the CT department at [phone] between the hours of 8:00 am and 5:00 pm, Monday-Friday. ____________________________________________________________  Your provider has requested that you go to the basement level for lab work before leaving today. Press "B" on the elevator. The lab is located at the first door on the left as you exit the elevator.  START Pantoprazole 40 mg 1 tablet every morning before breakfast.  Take Tylenol 1-2 tablets every 6 hours as needed.  Follow up pending the results of your CT.  Thank you for entrusting me with your care and choosing [facility]  Amy Esterwood, PA-C

## 2021-06-15 NOTE — H&P (View-Only) (Signed)
Subjective:    Patient ID: Allen Hodge, male    DOB: 11/17/1980, 40 y.o.   MRN: [id number]  HPI Allen Hodge is a pleasant 40 year old white male, established with Dr. Ardis Hughs.  He comes in today with complaints of 1 month history of new abdominal pain. He is in generally good health, does have history of anxiety and family history of colon cancer in his mother diagnosed at age 34.  Patient has undergone prior colonoscopies the last done in February 2020 with removal of a 3 mm tubular adenoma from the descending colon and is indicated for 5-year interval follow-up.  He says his current symptoms started about a month ago with pain in the left upper abdomen which seem to radiate into his back at times and was more uncomfortable and noticeable at night.  He says when he is laying down on his back after a while he will feel a pushing or pressure type sensation in the upper abdomen, he is more uncomfortable lying on his left side and feels better lying on the right side.  He does have symptoms during the day but seem to be less prominent.  Appetite has been okay he has had some fullness and bloating postprandially, intermittent nausea but no vomiting.  He is having somewhat less frequent and looser bowel movements, no melena or hematochezia.  Pain is not exacerbated by activity or eating. Now over the past week or so he has also been noticing tenderness in the right mid and right lower abdomen.  No fever or chills.  No dysuria No regular aspirin or NSAIDs, no regular EtOH.  No abdominal surgery.  Review of Systems  Pertinent positive and negative review of systems were noted in the above HPI section.  All other review of systems was otherwise negative.   Outpatient Encounter Medications as of 06/15/2021  Medication Sig   pantoprazole (PROTONIX) 40 MG tablet Take 1 tablet (40 mg total) by mouth daily before breakfast.   [DISCONTINUED] acetaminophen (TYLENOL) 500 MG tablet Take 1,000 mg by mouth every 6  (six) hours as needed for mild pain.   [DISCONTINUED] Cyanocobalamin ([employer] B-12 PO) Take 1 tablet by mouth daily.   [DISCONTINUED] GARLIC OIL PO Take 82,423 mg by mouth.   [DISCONTINUED] ibuprofen (ADVIL,MOTRIN) 200 MG tablet Take 200 mg by mouth every 6 (six) hours as needed.   [DISCONTINUED] Multiple Vitamins-Minerals (MULTIVITAMIN WITH MINERALS) tablet Take 1 tablet by mouth daily.   No facility-administered encounter medications on file as of 06/15/2021.   Allergies  Allergen Reactions   Bee Pollen-1000-Royal Jelly [Nutritional Supplements] Anaphylaxis    And Bee's Wax - inflammatory reaction per pt    Patient Active Problem List   Diagnosis Date Noted   Anxiety state 02/24/2016   Numbness 02/24/2016   Family history of colon cancer 03/26/2012   Social History   Socioeconomic History   Marital status: Married    Spouse name: Not on file   Number of children: 1   Years of education: Not on file   Highest education level: Not on file  Occupational History   Occupation: Sales   Tobacco Use   Smoking status: Never   Smokeless tobacco: Never  Substance and Sexual Activity   Alcohol use: Not Currently    Alcohol/week: 1.0 standard drink    Types: 1 Glasses of wine per week    Comment: Rare    Drug use: No   Sexual activity: Not on file  Other Topics Concern   Not  on file  Social History Narrative   Daily caffeine    Social Determinants of Health   Financial Resource Strain: Not on file  Food Insecurity: Not on file  Transportation Needs: Not on file  Physical Activity: Not on file  Stress: Not on file  Social Connections: Not on file  Intimate Partner Violence: Not on file    Mr. Cookston family history includes Alzheimer's disease in his paternal grandfather; Breast cancer in his maternal aunt; Colon cancer in his paternal grandfather; Colon cancer (age of onset: 54) in his mother; Colon polyps in his mother; Diabetes in his maternal grandmother; Prostate  cancer in his maternal grandfather.      Objective:    Vitals:   06/15/21 1435  BP: 110/70  Pulse: 78    Physical Exam  Well-developed well-nourished white male in no acute distress.  Height, Weight, 201 BMI 25.83  HEENT; nontraumatic normocephalic, EOMI, PE R LA, sclera anicteric. Oropharynx; not examined today Neck; supple, no JVD Cardiovascular; regular rate and rhythm with S1-S2, no murmur rub or gallop Pulmonary; Clear bilaterally Abdomen; soft,  nondistended, there is tenderness in the left upper quadrant, epigastrium right upper quadrant and right mid quadrant, no rebound ,no palpable mass or hepatosplenomegaly,, no prominence of aortic pulsation, bowel sounds are active Rectal; not done today Skin; benign exam, no jaundice rash or appreciable lesions Extremities; no clubbing cyanosis or edema skin warm and dry Neuro/Psych; alert and oriented x4, grossly nonfocal mood and affect appropriate        Assessment & Plan:   #65 40 year old white male with 1 month history of abdominal pain initially present in the left upper quadrant with some radiation into the back and more bothersome at night.  He will have increase in symptoms after lying on his back and describes a pressure-like sensation in the epigastrium into the chest, more comfortable lying on his right side. Now he is also developed right-sided abdominal tenderness, some nausea without vomiting, less frequent bowel movements and decrease in appetite, no melena or hematochezia, no fever or chills.  Etiology of symptoms is not clear, he is tender on exam.  Suspect intra-abdominal inflammatory process, rule out possible low-grade pancreatitis, IBD, colopathy, occult colon lesion.  Also consider peptic ulcer disease, duodenopathy  #2 family history of colon cancer in patient's mother #3 personal history of adenomatous polyps, and up-to-date with colonoscopy last done February 2020, - 5-year interval follow-up  Plan; CBC  with differential, c-Met, lipase, sed rate, CRP Start empiric trial of Protonix 40 mg p.o. every morning AC breakfast Schedule for CT of the abdomen and pelvis with contrast to be done within the next few days. Patient has been advised that he if he has any significant increase in abdominal pain to proceed to the emergency room for evaluation. Further recommendations pending results of labs and imaging as above.   Tandrea Kommer Genia Harold PA-C 06/15/2021   Cc: [name], MD

## 2021-06-15 NOTE — Progress Notes (Signed)
Subjective:    Patient ID: Allen Hodge, male    DOB: 11/17/1980, 40 y.o.   MRN: [id number]  HPI Allen Hodge is a pleasant 40 year old white male, established with Dr. Ardis Hughs.  He comes in today with complaints of 1 month history of new abdominal pain. He is in generally good health, does have history of anxiety and family history of colon cancer in his mother diagnosed at age 34.  Patient has undergone prior colonoscopies the last done in February 2020 with removal of a 3 mm tubular adenoma from the descending colon and is indicated for 5-year interval follow-up.  He says his current symptoms started about a month ago with pain in the left upper abdomen which seem to radiate into his back at times and was more uncomfortable and noticeable at night.  He says when he is laying down on his back after a while he will feel a pushing or pressure type sensation in the upper abdomen, he is more uncomfortable lying on his left side and feels better lying on the right side.  He does have symptoms during the day but seem to be less prominent.  Appetite has been okay he has had some fullness and bloating postprandially, intermittent nausea but no vomiting.  He is having somewhat less frequent and looser bowel movements, no melena or hematochezia.  Pain is not exacerbated by activity or eating. Now over the past week or so he has also been noticing tenderness in the right mid and right lower abdomen.  No fever or chills.  No dysuria No regular aspirin or NSAIDs, no regular EtOH.  No abdominal surgery.  Review of Systems  Pertinent positive and negative review of systems were noted in the above HPI section.  All other review of systems was otherwise negative.   Outpatient Encounter Medications as of 06/15/2021  Medication Sig   pantoprazole (PROTONIX) 40 MG tablet Take 1 tablet (40 mg total) by mouth daily before breakfast.   [DISCONTINUED] acetaminophen (TYLENOL) 500 MG tablet Take 1,000 mg by mouth every 6  (six) hours as needed for mild pain.   [DISCONTINUED] Cyanocobalamin ([employer] B-12 PO) Take 1 tablet by mouth daily.   [DISCONTINUED] GARLIC OIL PO Take 82,423 mg by mouth.   [DISCONTINUED] ibuprofen (ADVIL,MOTRIN) 200 MG tablet Take 200 mg by mouth every 6 (six) hours as needed.   [DISCONTINUED] Multiple Vitamins-Minerals (MULTIVITAMIN WITH MINERALS) tablet Take 1 tablet by mouth daily.   No facility-administered encounter medications on file as of 06/15/2021.   Allergies  Allergen Reactions   Bee Pollen-1000-Royal Jelly [Nutritional Supplements] Anaphylaxis    And Bee's Wax - inflammatory reaction per pt    Patient Active Problem List   Diagnosis Date Noted   Anxiety state 02/24/2016   Numbness 02/24/2016   Family history of colon cancer 03/26/2012   Social History   Socioeconomic History   Marital status: Married    Spouse name: Not on file   Number of children: 1   Years of education: Not on file   Highest education level: Not on file  Occupational History   Occupation: Sales   Tobacco Use   Smoking status: Never   Smokeless tobacco: Never  Substance and Sexual Activity   Alcohol use: Not Currently    Alcohol/week: 1.0 standard drink    Types: 1 Glasses of wine per week    Comment: Rare    Drug use: No   Sexual activity: Not on file  Other Topics Concern   Not  on file  Social History Narrative   Daily caffeine    Social Determinants of Health   Financial Resource Strain: Not on file  Food Insecurity: Not on file  Transportation Needs: Not on file  Physical Activity: Not on file  Stress: Not on file  Social Connections: Not on file  Intimate Partner Violence: Not on file    Mr. Cookston family history includes Alzheimer's disease in his paternal grandfather; Breast cancer in his maternal aunt; Colon cancer in his paternal grandfather; Colon cancer (age of onset: 54) in his mother; Colon polyps in his mother; Diabetes in his maternal grandmother; Prostate  cancer in his maternal grandfather.      Objective:    Vitals:   06/15/21 1435  BP: 110/70  Pulse: 78    Physical Exam  Well-developed well-nourished white male in no acute distress.  Height, Weight, 201 BMI 25.83  HEENT; nontraumatic normocephalic, EOMI, PE R LA, sclera anicteric. Oropharynx; not examined today Neck; supple, no JVD Cardiovascular; regular rate and rhythm with S1-S2, no murmur rub or gallop Pulmonary; Clear bilaterally Abdomen; soft,  nondistended, there is tenderness in the left upper quadrant, epigastrium right upper quadrant and right mid quadrant, no rebound ,no palpable mass or hepatosplenomegaly,, no prominence of aortic pulsation, bowel sounds are active Rectal; not done today Skin; benign exam, no jaundice rash or appreciable lesions Extremities; no clubbing cyanosis or edema skin warm and dry Neuro/Psych; alert and oriented x4, grossly nonfocal mood and affect appropriate        Assessment & Plan:   #65 40 year old white male with 1 month history of abdominal pain initially present in the left upper quadrant with some radiation into the back and more bothersome at night.  He will have increase in symptoms after lying on his back and describes a pressure-like sensation in the epigastrium into the chest, more comfortable lying on his right side. Now he is also developed right-sided abdominal tenderness, some nausea without vomiting, less frequent bowel movements and decrease in appetite, no melena or hematochezia, no fever or chills.  Etiology of symptoms is not clear, he is tender on exam.  Suspect intra-abdominal inflammatory process, rule out possible low-grade pancreatitis, IBD, colopathy, occult colon lesion.  Also consider peptic ulcer disease, duodenopathy  #2 family history of colon cancer in patient's mother #3 personal history of adenomatous polyps, and up-to-date with colonoscopy last done February 2020, - 5-year interval follow-up  Plan; CBC  with differential, c-Met, lipase, sed rate, CRP Start empiric trial of Protonix 40 mg p.o. every morning AC breakfast Schedule for CT of the abdomen and pelvis with contrast to be done within the next few days. Patient has been advised that he if he has any significant increase in abdominal pain to proceed to the emergency room for evaluation. Further recommendations pending results of labs and imaging as above.   Shakerria Parran Genia Harold PA-C 06/15/2021   Cc: [name], MD

## 2021-06-16 NOTE — Progress Notes (Signed)
I agree with the above note, plan 

## 2021-06-21 ENCOUNTER — Other Ambulatory Visit: Payer: Self-pay

## 2021-06-21 ENCOUNTER — Ambulatory Visit (INDEPENDENT_AMBULATORY_CARE_PROVIDER_SITE_OTHER)
Admission: RE | Admit: 2021-06-21 | Discharge: 2021-06-21 | Disposition: A | Discharge status: Home / Self Care | Payer: No Typology Code available for payment source | Source: Ambulatory Visit | Attending: Physician Assistant | Admitting: Physician Assistant

## 2021-06-21 DIAGNOSIS — R1010 Upper abdominal pain, unspecified: Secondary | ICD-10-CM | POA: Diagnosis not present

## 2021-06-21 DIAGNOSIS — R1084 Generalized abdominal pain: Secondary | ICD-10-CM | POA: Diagnosis not present

## 2021-06-21 DIAGNOSIS — R1012 Left upper quadrant pain: Secondary | ICD-10-CM

## 2021-06-21 DIAGNOSIS — R110 Nausea: Secondary | ICD-10-CM

## 2021-06-21 MED ORDER — IOHEXOL 350 MG/ML SOLN
100.0000 mL | Freq: Once | INTRAVENOUS | Status: AC | PRN
  Administered 2021-06-21: 100 mL via INTRAVENOUS

## 2021-06-23 ENCOUNTER — Telehealth: Payer: Self-pay | Admitting: Hematology

## 2021-06-23 ENCOUNTER — Other Ambulatory Visit: Payer: Self-pay | Admitting: Gastroenterology

## 2021-06-23 ENCOUNTER — Other Ambulatory Visit: Payer: Self-pay

## 2021-06-23 DIAGNOSIS — R599 Enlarged lymph nodes, unspecified: Secondary | ICD-10-CM

## 2021-06-23 MED ORDER — HYDROCODONE-ACETAMINOPHEN 7.5-325 MG PO TABS: 1.0000 | ORAL_TABLET | Freq: Four times a day (QID) | ORAL | 0 refills | Status: DC | PRN

## 2021-06-23 NOTE — Telephone Encounter (Signed)
Scheduled appt per 11/11 referral. PT is aware of appt date and time.

## 2021-06-23 NOTE — Telephone Encounter (Signed)
-----   Message from Timothy Lasso, RN sent at 06/23/2021 11:45 AM EST ----- The pt has been scheduled for 06/29/21 at [facility]. He has been instructed and information sent to My Chart.  He did confirm that he can view the information. He also asked if she can get a prescription for narcotic pain med sent to his pharmacy.  He states Dr Ardis Hughs offered it this morning and he declined but now would like to have it on hand for increasing pain.  Please advise

## 2021-06-26 ENCOUNTER — Other Ambulatory Visit: Payer: Self-pay

## 2021-06-26 ENCOUNTER — Other Ambulatory Visit (INDEPENDENT_AMBULATORY_CARE_PROVIDER_SITE_OTHER): Payer: No Typology Code available for payment source

## 2021-06-26 DIAGNOSIS — R591 Generalized enlarged lymph nodes: Secondary | ICD-10-CM

## 2021-06-26 DIAGNOSIS — R599 Enlarged lymph nodes, unspecified: Secondary | ICD-10-CM

## 2021-06-26 LAB — COMPREHENSIVE METABOLIC PANEL
ALT: 8 U/L (ref 0–53)
AST: 11 U/L (ref 0–37)
Albumin: 4.3 g/dL (ref 3.5–5.2)
Alkaline Phosphatase: 55 U/L (ref 39–117)
BUN: 13 mg/dL (ref 6–23)
CO2: 29 mEq/L (ref 19–32)
Calcium: 9.6 mg/dL (ref 8.4–10.5)
Chloride: 98 mEq/L (ref 96–112)
Creatinine, Ser: 1.62 mg/dL — ABNORMAL HIGH (ref 0.40–1.50)
GFR: 52.78 mL/min — ABNORMAL LOW (ref >60.00)
Glucose, Bld: 99 mg/dL (ref 70–99)
Potassium: 4.5 mEq/L (ref 3.5–5.1)
Sodium: 136 mEq/L (ref 135–145)
Total Bilirubin: 0.6 mg/dL (ref 0.2–1.2)
Total Protein: 7.5 g/dL (ref 6.0–8.3)

## 2021-06-26 LAB — CBC WITH DIFFERENTIAL/PLATELET
Basophils Absolute: 0.1 10*3/uL (ref 0.0–0.1)
Basophils Relative: 0.5 % (ref 0.0–3.0)
Eosinophils Absolute: 0.1 10*3/uL (ref 0.0–0.7)
Eosinophils Relative: 1.1 % (ref 0.0–5.0)
HCT: 41.3 % (ref 39.0–52.0)
Hemoglobin: 13.7 g/dL (ref 13.0–17.0)
Lymphocytes Relative: 15.8 % (ref 12.0–46.0)
Lymphs Abs: 1.9 10*3/uL (ref 0.7–4.0)
MCHC: 33.2 g/dL (ref 30.0–36.0)
MCV: 87.2 fl (ref 78.0–100.0)
Monocytes Absolute: 1 10*3/uL (ref 0.1–1.0)
Monocytes Relative: 8.2 % (ref 3.0–12.0)
Neutro Abs: 9.1 10*3/uL — ABNORMAL HIGH (ref 1.4–7.7)
Neutrophils Relative %: 74.4 % (ref 43.0–77.0)
Platelets: 345 10*3/uL (ref 150.0–400.0)
RBC: 4.74 Mil/uL (ref 4.22–5.81)
RDW: 13.2 % (ref 11.5–15.5)
WBC: 12.2 10*3/uL — ABNORMAL HIGH (ref 4.0–10.5)

## 2021-06-27 LAB — LACTATE DEHYDROGENASE: LDH: 257 U/L — ABNORMAL HIGH (ref 100–220)

## 2021-06-29 ENCOUNTER — Ambulatory Visit (HOSPITAL_COMMUNITY): Payer: No Typology Code available for payment source | Admitting: Certified Registered Nurse Anesthetist

## 2021-06-29 ENCOUNTER — Encounter (HOSPITAL_COMMUNITY)
Admission: RE | Disposition: A | Discharge status: Home / Self Care | Payer: Self-pay | Source: Home / Self Care | Attending: Gastroenterology

## 2021-06-29 ENCOUNTER — Encounter (HOSPITAL_COMMUNITY): Payer: Self-pay | Admitting: Gastroenterology

## 2021-06-29 ENCOUNTER — Ambulatory Visit (HOSPITAL_COMMUNITY)
Admission: RE | Admit: 2021-06-29 | Discharge: 2021-06-29 | Disposition: A | Discharge status: Home / Self Care | Payer: No Typology Code available for payment source | Attending: Gastroenterology | Admitting: Gastroenterology

## 2021-06-29 ENCOUNTER — Other Ambulatory Visit: Payer: Self-pay

## 2021-06-29 DIAGNOSIS — C8593 Non-Hodgkin lymphoma, unspecified, intra-abdominal lymph nodes: Secondary | ICD-10-CM | POA: Diagnosis not present

## 2021-06-29 DIAGNOSIS — K591 Functional diarrhea: Secondary | ICD-10-CM | POA: Diagnosis not present

## 2021-06-29 DIAGNOSIS — R590 Localized enlarged lymph nodes: Secondary | ICD-10-CM | POA: Diagnosis present

## 2021-06-29 HISTORY — PX: FINE NEEDLE ASPIRATION: SHX5430

## 2021-06-29 HISTORY — PX: ESOPHAGOGASTRODUODENOSCOPY (EGD) WITH PROPOFOL: SHX5813

## 2021-06-29 HISTORY — PX: EUS: SHX5427

## 2021-06-29 SURGERY — UPPER ENDOSCOPIC ULTRASOUND (EUS) RADIAL
Anesthesia: Monitor Anesthesia Care

## 2021-06-29 MED ORDER — PROPOFOL 10 MG/ML IV BOLUS
INTRAVENOUS | Status: DC | PRN
  Administered 2021-06-29: 30 mg via INTRAVENOUS
  Administered 2021-06-29: 20 mg via INTRAVENOUS
  Administered 2021-06-29: 50 mg via INTRAVENOUS
  Administered 2021-06-29: 20 mg via INTRAVENOUS

## 2021-06-29 MED ORDER — LIDOCAINE 2% (20 MG/ML) 5 ML SYRINGE
INTRAMUSCULAR | Status: DC | PRN
  Administered 2021-06-29: 100 mg via INTRAVENOUS

## 2021-06-29 MED ORDER — PROPOFOL 500 MG/50ML IV EMUL
INTRAVENOUS | Status: DC | PRN
  Administered 2021-06-29: 100 ug/kg/min via INTRAVENOUS

## 2021-06-29 MED ORDER — LACTATED RINGERS IV SOLN: INTRAVENOUS | Status: DC

## 2021-06-29 SURGICAL SUPPLY — 14 items

## 2021-06-29 NOTE — Transfer of Care (Signed)
Immediate Anesthesia Transfer of Care Note  Patient: Allen Hodge  Procedure(s) Performed: UPPER ENDOSCOPIC ULTRASOUND (EUS) RADIAL ESOPHAGOGASTRODUODENOSCOPY (EGD) WITH PROPOFOL FINE NEEDLE ASPIRATION (FNA) LINEAR  Patient Location: PACU  Anesthesia Type:MAC  Level of Consciousness: awake and oriented  Airway & Oxygen Therapy: Patient Spontanous Breathing and Patient connected to face mask oxygen  Post-op Assessment: Report given to RN and Post -op Vital signs reviewed and stable  Post vital signs: Reviewed and stable  Last Vitals:  Vitals Value Taken Time  BP    Temp    Pulse 84 06/29/21 1200  Resp 15 06/29/21 1200  SpO2 99 % 06/29/21 1200  Vitals shown include unvalidated device data.  Last Pain:  Vitals:   06/29/21 1021  PainSc: 4          Complications: No notable events documented.

## 2021-06-29 NOTE — Anesthesia Procedure Notes (Signed)
Procedure Name: MAC Date/Time: 06/29/2021 11:25 AM Performed by: Deliah Boston, CRNA Pre-anesthesia Checklist: Patient identified, Emergency Drugs available, Suction available and Patient being monitored Patient Re-evaluated:Patient Re-evaluated prior to induction Oxygen Delivery Method: Simple face mask Preoxygenation: Pre-oxygenation with 100% oxygen Placement Confirmation: positive ETCO2 Dental Injury: Teeth and Oropharynx as per pre-operative assessment

## 2021-06-29 NOTE — Interval H&P Note (Signed)
History and Physical Interval Note:  06/29/2021 10:19 AM  Allen Hodge  has presented today for surgery, with the diagnosis of abdominal adenopathy.  The various methods of treatment have been discussed with the patient and family. After consideration of risks, benefits and other options for treatment, the patient has consented to  Procedure(s): UPPER ENDOSCOPIC ULTRASOUND (EUS) RADIAL (N/A) ESOPHAGOGASTRODUODENOSCOPY (EGD) WITH PROPOFOL (N/A) as a surgical intervention.  The patient's history has been reviewed, patient examined, no change in status, stable for surgery.  I have reviewed the patient's chart and labs.  Questions were answered to the patient's satisfaction.     Milus Banister

## 2021-06-29 NOTE — Op Note (Signed)
[facility] Patient Name: Allen Hodge Procedure Date: 06/29/2021 MRN: [id number] Attending MD: Milus Banister , MD Date of Birth: 1980/12/16 CSN: [id number] Age: 40 Admit Type: Inpatient Procedure:                Upper EUS Indications:              Lymphadenopathy on CT scan Providers:                Milus Banister, MD, Burtis Junes, RN, Benetta Spar, Technician Referring MD:              Medicines:                Monitored Anesthesia Care Complications:            No immediate complications. Estimated blood loss:                            None. Estimated Blood Loss:     Estimated blood loss: none. Procedure:                Pre-Anesthesia Assessment:                           - Prior to the procedure, a History and Physical                            was performed, and patient medications and                            allergies were reviewed. The patient's tolerance of                            previous anesthesia was also reviewed. The risks                            and benefits of the procedure and the sedation                            options and risks were discussed with the patient.                            All questions were answered, and informed consent                            was obtained. Prior Anticoagulants: The patient has                            taken no previous anticoagulant or antiplatelet                            agents. ASA Grade Assessment: I - A normal, healthy                            patient.  After reviewing the risks and benefits,                            the patient was deemed in satisfactory condition to                            undergo the procedure.                           After obtaining informed consent, the endoscope was                            passed under direct vision. Throughout the                            procedure, the patient's blood pressure, pulse, and                             oxygen saturations were monitored continuously. The                            [id number] ([id number]) Olympus linear ultrasound scope                            was introduced through the mouth, and advanced to                            the antrum of the stomach. The upper EUS was                            accomplished without difficulty. The patient                            tolerated the procedure well. Scope In: Scope Out: Findings:      ENDOSCOPIC FINDING (limited views with linear echoendoscope) :      The examined esophagus was endoscopically normal.      The entire examined stomach was endoscopically normal.      The examined duodenum was endoscopically normal.      ENDOSONOGRAPHIC FINDING: :      1. Multiple enlarged perigastric lymphnodes were seen ranging in size       from 44mm to 23mm. These were mainly round, well demarcated, and       hypoechoic. These were sampled with three transgastric passes with a 22       gauge EUS FNB needle.      2. Extensive adenopathy also noted in mediastinum, abutting the       esophagus. These were not sampled. Impression:               - Multiple abdominal and mediastinal lymphnodes.                            Abdominal adenopathy was sampled and sent for                            cytology. Moderate  Sedation:      Not Applicable - Patient had care per Anesthesia. Recommendation:           - Discharge patient to home.                           - Await final cytology testing.                           - PET scan already scheduled for next week.                           - Medical oncology appt already scheduled for 11/29 Procedure Code(s):        --- Professional ---                           [phone], 77, Esophagogastroduodenoscopy, flexible,                            transoral; with transendoscopic ultrasound-guided                            intramural or transmural fine needle                             aspiration/biopsy(s), (includes endoscopic                            ultrasound examination limited to the esophagus,                            stomach or duodenum, and adjacent structures) Diagnosis Code(s):        --- Professional ---                           I89.9, Noninfective disorder of lymphatic vessels                            and lymph nodes, unspecified                           R59.1, Generalized enlarged lymph nodes CPT copyright 2019 American Medical Association. All rights reserved. The codes documented in this report are preliminary and upon coder review may  be revised to meet current compliance requirements. Milus Banister, MD 06/29/2021 12:02:27 PM This report has been signed electronically. Number of Addenda: 0

## 2021-06-29 NOTE — Discharge Instructions (Signed)
YOU HAD AN ENDOSCOPIC PROCEDURE TODAY: Refer to the procedure report and other information in the discharge instructions given to you for any specific questions about what was found during the examination. If this information does not answer your questions, please call [place] office at [phone] to clarify.   YOU SHOULD EXPECT: Some feelings of bloating in the abdomen. Passage of more gas than usual. Walking can help get rid of the air that was put into your GI tract during the procedure and reduce the bloating. If you had a lower endoscopy (such as a colonoscopy or flexible sigmoidoscopy) you may notice spotting of blood in your stool or on the toilet paper. Some abdominal soreness may be present for a day or two, also.  DIET: Your first meal following the procedure should be a light meal and then it is ok to progress to your normal diet. A half-sandwich or bowl of soup is an example of a good first meal. Heavy or fried foods are harder to digest and may make you feel nauseous or bloated. Drink plenty of fluids but you should avoid alcoholic beverages for 24 hours. If you had a esophageal dilation, please see attached instructions for diet.    ACTIVITY: Your care partner should take you home directly after the procedure. You should plan to take it easy, moving slowly for the rest of the day. You can resume normal activity the day after the procedure however YOU SHOULD NOT DRIVE, use power tools, machinery or perform tasks that involve climbing or major physical exertion for 24 hours (because of the sedation medicines used during the test).   SYMPTOMS TO REPORT IMMEDIATELY: A gastroenterologist can be reached at any hour. Please call [phone]  for any of the following symptoms:   Following upper endoscopy (EGD, EUS, ERCP, esophageal dilation) Vomiting of blood or coffee ground material  New, significant abdominal pain  New, significant chest pain or pain under the shoulder blades  Painful or  persistently difficult swallowing  New shortness of breath  Black, tarry-looking or red, bloody stools  FOLLOW UP:  If any biopsies were taken you will be contacted by phone or by letter within the next 1-3 weeks. Call [phone]  if you have not heard about the biopsies in 3 weeks.  Please also call with any specific questions about appointments or follow up tests.  

## 2021-06-29 NOTE — Anesthesia Postprocedure Evaluation (Signed)
Anesthesia Post Note  Patient: Allen Hodge  Procedure(s) Performed: UPPER ENDOSCOPIC ULTRASOUND (EUS) RADIAL ESOPHAGOGASTRODUODENOSCOPY (EGD) WITH PROPOFOL FINE NEEDLE ASPIRATION (FNA) LINEAR     Patient location during evaluation: PACU Anesthesia Type: MAC Level of consciousness: awake and alert Pain management: pain level controlled Vital Signs Assessment: post-procedure vital signs reviewed and stable Respiratory status: spontaneous breathing and respiratory function stable Cardiovascular status: stable Postop Assessment: no apparent nausea or vomiting Anesthetic complications: no   No notable events documented.  Last Vitals:  Vitals:   06/29/21 1201 06/29/21 1210  BP: 104/60 108/68  Pulse: 84 84  Resp: 15 14  SpO2: 99% 97%    Last Pain:  Vitals:   06/29/21 1210  PainSc: 0-No pain                 [place]

## 2021-06-29 NOTE — Anesthesia Preprocedure Evaluation (Addendum)
Anesthesia Evaluation  Patient identified by MRN, date of birth, ID band Patient awake    Reviewed: Allergy & Precautions, H&P , NPO status , Patient's Chart, lab work & pertinent test results  Airway Mallampati: II  TM Distance: >3 FB Neck ROM: Full    Dental no notable dental hx.    Pulmonary neg pulmonary ROS,    Pulmonary exam normal breath sounds clear to auscultation       Cardiovascular negative cardio ROS Normal cardiovascular exam Rhythm:Regular Rate:Normal     Neuro/Psych Anxiety negative neurological ROS     GI/Hepatic negative GI ROS, Neg liver ROS,   Endo/Other  negative endocrine ROS  Renal/GU negative Renal ROS  negative genitourinary   Musculoskeletal negative musculoskeletal ROS (+)   Abdominal   Peds negative pediatric ROS (+)  Hematology negative hematology ROS (+)   Anesthesia Other Findings   Reproductive/Obstetrics negative OB ROS                            Anesthesia Physical Anesthesia Plan  ASA: 2  Anesthesia Plan: General and MAC   Post-op Pain Management:    Induction: Intravenous  PONV Risk Score and Plan: 3 and Propofol infusion, Treatment may vary due to age or medical condition and TIVA  Airway Management Planned: Natural Airway and Simple Face Mask  Additional Equipment: None  Intra-op Plan:   Post-operative Plan:   Informed Consent: I have reviewed the patients History and Physical, chart, labs and discussed the procedure including the risks, benefits and alternatives for the proposed anesthesia with the patient or authorized representative who has indicated his/her understanding and acceptance.     Dental advisory given  Plan Discussed with: CRNA and Anesthesiologist  Anesthesia Plan Comments:         Anesthesia Quick Evaluation

## 2021-07-02 ENCOUNTER — Encounter (HOSPITAL_COMMUNITY): Payer: Self-pay | Admitting: Gastroenterology

## 2021-07-03 LAB — SURGICAL PATHOLOGY

## 2021-07-04 ENCOUNTER — Telehealth: Payer: Self-pay | Admitting: Gastroenterology

## 2021-07-04 ENCOUNTER — Other Ambulatory Visit: Payer: Self-pay

## 2021-07-04 ENCOUNTER — Encounter (HOSPITAL_COMMUNITY)
Admission: RE | Admit: 2021-07-04 | Discharge: 2021-07-04 | Disposition: A | Discharge status: Home / Self Care | Payer: No Typology Code available for payment source | Source: Ambulatory Visit | Attending: Gastroenterology | Admitting: Gastroenterology

## 2021-07-04 DIAGNOSIS — R599 Enlarged lymph nodes, unspecified: Secondary | ICD-10-CM | POA: Insufficient documentation

## 2021-07-04 DIAGNOSIS — R591 Generalized enlarged lymph nodes: Secondary | ICD-10-CM | POA: Insufficient documentation

## 2021-07-04 LAB — GLUCOSE, CAPILLARY: Glucose-Capillary: 99 mg/dL (ref 70–99)

## 2021-07-04 LAB — CYTOLOGY - NON PAP

## 2021-07-04 MED ORDER — FLUDEOXYGLUCOSE F - 18 (FDG) INJECTION
10.0000 | Freq: Once | INTRAVENOUS | Status: AC | PRN
  Administered 2021-07-04: 9.69 via INTRAVENOUS

## 2021-07-04 NOTE — Telephone Encounter (Signed)
I called Allen Hodge this AM. There were 2 sections to his LN path report and I missed the second section before writing a path result note to him.   The second section of his path report shows he has 'malignant non-hodgkins lymphoma.'  I apologized for the confusion, will get back in touch with him after pet results are back.

## 2021-07-11 ENCOUNTER — Inpatient Hospital Stay: Payer: No Typology Code available for payment source | Attending: Hematology | Admitting: Hematology

## 2021-07-11 ENCOUNTER — Inpatient Hospital Stay: Payer: No Typology Code available for payment source

## 2021-07-11 ENCOUNTER — Other Ambulatory Visit: Payer: Self-pay

## 2021-07-11 VITALS — BP 114/81 | HR 74 | Temp 97.9°F | Resp 20 | Wt 193.7 lb

## 2021-07-11 DIAGNOSIS — C8590 Non-Hodgkin lymphoma, unspecified, unspecified site: Secondary | ICD-10-CM | POA: Diagnosis present

## 2021-07-11 DIAGNOSIS — Z803 Family history of malignant neoplasm of breast: Secondary | ICD-10-CM

## 2021-07-11 DIAGNOSIS — C8598 Non-Hodgkin lymphoma, unspecified, lymph nodes of multiple sites: Secondary | ICD-10-CM

## 2021-07-11 DIAGNOSIS — Z8042 Family history of malignant neoplasm of prostate: Secondary | ICD-10-CM | POA: Diagnosis not present

## 2021-07-11 DIAGNOSIS — Z800 Family history of malignant neoplasm of digestive organs: Secondary | ICD-10-CM | POA: Diagnosis not present

## 2021-07-11 DIAGNOSIS — Z8371 Family history of colonic polyps: Secondary | ICD-10-CM

## 2021-07-11 LAB — CMP (CANCER CENTER ONLY)
ALT: 15 U/L (ref 0–44)
AST: 16 U/L (ref 15–41)
Albumin: 4.3 g/dL (ref 3.5–5.0)
Alkaline Phosphatase: 58 U/L (ref 38–126)
Anion gap: 10 (ref 5–15)
BUN: 14 mg/dL (ref 6–20)
CO2: 26 mmol/L (ref 22–32)
Calcium: 9.6 mg/dL (ref 8.9–10.3)
Chloride: 100 mmol/L (ref 98–111)
Creatinine: 0.98 mg/dL (ref 0.61–1.24)
GFR, Estimated: >60 mL/min (ref >60)
Glucose, Bld: 86 mg/dL (ref 70–99)
Potassium: 4.3 mmol/L (ref 3.5–5.1)
Sodium: 136 mmol/L (ref 135–145)
Total Bilirubin: 0.4 mg/dL (ref 0.3–1.2)
Total Protein: 7.7 g/dL (ref 6.5–8.1)

## 2021-07-11 LAB — URINALYSIS, ROUTINE W REFLEX MICROSCOPIC
Bilirubin Urine: NEGATIVE
Glucose, UA: NEGATIVE mg/dL
Hgb urine dipstick: NEGATIVE
Ketones, ur: 20 mg/dL — AB
Leukocytes,Ua: NEGATIVE
Nitrite: NEGATIVE
Protein, ur: NEGATIVE mg/dL
Specific Gravity, Urine: 1.016 (ref 1.005–1.030)
pH: 5 (ref 5.0–8.0)

## 2021-07-11 LAB — CBC WITH DIFFERENTIAL/PLATELET
Abs Immature Granulocytes: 0.02 10*3/uL (ref 0.00–0.07)
Basophils Absolute: 0.1 10*3/uL (ref 0.0–0.1)
Basophils Relative: 1 %
Eosinophils Absolute: 0.3 10*3/uL (ref 0.0–0.5)
Eosinophils Relative: 3 %
HCT: 41.3 % (ref 39.0–52.0)
Hemoglobin: 13.7 g/dL (ref 13.0–17.0)
Immature Granulocytes: 0 %
Lymphocytes Relative: 18 %
Lymphs Abs: 1.7 10*3/uL (ref 0.7–4.0)
MCH: 28.8 pg (ref 26.0–34.0)
MCHC: 33.2 g/dL (ref 30.0–36.0)
MCV: 86.9 fL (ref 80.0–100.0)
Monocytes Absolute: 0.7 10*3/uL (ref 0.1–1.0)
Monocytes Relative: 8 %
Neutro Abs: 6.3 10*3/uL (ref 1.7–7.7)
Neutrophils Relative %: 70 %
Platelets: 381 10*3/uL (ref 150–400)
RBC: 4.75 MIL/uL (ref 4.22–5.81)
RDW: 12.6 % (ref 11.5–15.5)
WBC: 9 10*3/uL (ref 4.0–10.5)
nRBC: 0 % (ref 0.0–0.2)

## 2021-07-11 LAB — LACTATE DEHYDROGENASE: LDH: 300 U/L — ABNORMAL HIGH (ref 98–192)

## 2021-07-11 LAB — HEPATITIS B CORE ANTIBODY, TOTAL: Hep B Core Total Ab: NONREACTIVE

## 2021-07-11 LAB — HEPATITIS B SURFACE ANTIGEN: Hepatitis B Surface Ag: NONREACTIVE

## 2021-07-11 LAB — HEPATITIS C ANTIBODY: HCV Ab: NONREACTIVE

## 2021-07-11 LAB — HIV ANTIBODY (ROUTINE TESTING W REFLEX): HIV Screen 4th Generation wRfx: NONREACTIVE

## 2021-07-11 LAB — URIC ACID: Uric Acid, Serum: 8 mg/dL (ref 3.7–8.6)

## 2021-07-11 NOTE — Progress Notes (Addendum)
[name]   HEMATOLOGY/ONCOLOGY CONSULTATION NOTE  Date of Service: 07/11/2021  Patient Care Team: [name], MD as PCP - General (Family Medicine)  CHIEF COMPLAINTS/PURPOSE OF CONSULTATION:  NHL  HISTORY OF PRESENTING ILLNESS:   Allen Hodge is a wonderful 40 y.o. male who has been referred to [place] by Dr. Owens Loffler MS for evaluation and management of newly diagnosed non-Hodgkin's lymphoma.  Patient is a otherwise healthy gentleman with previous history of basal cell skin cancer, herniated disc, nasal allergies, possible food allergies/intolerances with irregular bowel habits chronically.  Patient was referred to and was seen by Dr. Owens Loffler on 06/15/2021 with 1 month history of significant abdominal pain initially in the left upper quadrant with some radiation to the back and more bothersome when he lay flat at nighttime.  He also noted some increased pressure-like sensation in the epigastric area and in the center of the abdomen. Subsequently the abdominal pain was then more broadly present over the entire abdomen. He endorsed some nausea without vomiting, decreased bowel movements and a decrease in appetite.  No melena or hematochezia. No fevers no chills no night sweats.  No clearly defined significant weight loss.  Patient was started on Protonix 40 mg before breakfast every day and had a CT of the abdomen and pelvis with contrast on 06/21/2021 which showed bulky abdominal lymphadenopathy throughout retroperitoneum and small bowel mesentery.  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  Patient subsequently had a PET CT scan 07/04/2021 which showed hypermetabolic right supraclavicular and mediastinal adenopathy.  Metabolic activity with 2 subsolid nodules in the right upper lung concerning for lymphoma involvement.  Bulky hypermetabolic central mesenteric  adenopathy and retroperitoneal lymphadenopathy in the upper abdomen consistent with nodal lymphoma.  4.4 cm segment of hypermetabolic small bowel thickening consistent with lymphoma of the small bowel. No inguinal or Ilac hypermetabolic nodes.  No spleen or bone marrow involvement noted.  Patient subsequently had an upper endoscopic ultrasound on 06/29/2021 which showed multiple abdominal and mediastinal lymph nodes.  Abdominal lymphadenopathy was sampled and sent for cytology. Sample was insufficient for flow cytometric. Cytology showed cells consistent with malignant non-Hodgkin's lymphoma-subclassification and grading of the lymphoma was not possible based on limited sample.  Patient is here with continued abdominal discomfort.  He notes that he is still able to keep some food down and has not had any obvious vomiting.  Still having regular bowel movements.  Still following a gluten-free and primarily dairy free diet.  Notes no fevers no chills no night sweats no significant weight loss. No other focal symptoms other than abdominal discomfort. 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   MEDICAL HISTORY:  Past Medical History:  Diagnosis Date   Allergy    Cancer ([place])    skin cancer   Herniated disc    Hx of skin cancer, basal cell    Hyperlipidemia    borderline- no meds     SURGICAL HISTORY: Past Surgical History:  Procedure Laterality Date   COLONOSCOPY     last 2013   ESOPHAGOGASTRODUODENOSCOPY (EGD) WITH PROPOFOL N/A 06/29/2021   Procedure: ESOPHAGOGASTRODUODENOSCOPY (EGD) WITH PROPOFOL;  Surgeon: Milus Banister, MD;  Location: [facility];  Service: Endoscopy;  Laterality: N/A;   EUS N/A 06/29/2021   Procedure: UPPER ENDOSCOPIC  ULTRASOUND (EUS) RADIAL;  Surgeon: Milus Banister, MD;  Location: [facility];  Service: Endoscopy;  Laterality: N/A;    FINE NEEDLE ASPIRATION N/A 06/29/2021   Procedure: FINE NEEDLE ASPIRATION (FNA) LINEAR;  Surgeon: Milus Banister, MD;  Location: [facility];  Service: Endoscopy;  Laterality: N/A;   VASECTOMY      SOCIAL HISTORY: Social History   Socioeconomic History   Marital status: Married    Spouse name: Not on file   Number of children: 1   Years of education: Not on file   Highest education level: Not on file  Occupational History   Occupation: Sales   Tobacco Use   Smoking status: Never   Smokeless tobacco: Never  Substance and Sexual Activity   Alcohol use: Not Currently    Alcohol/week: 1.0 standard drink    Types: 1 Glasses of wine per week    Comment: Rare    Drug use: No   Sexual activity: Not on file  Other Topics Concern   Not on file  Social History Narrative   Daily caffeine    Social Determinants of Health   Financial Resource Strain: Not on file  Food Insecurity: Not on file  Transportation Needs: Not on file  Physical Activity: Not on file  Stress: Not on file  Social Connections: Not on file  Intimate Partner Violence: Not on file    FAMILY HISTORY: Family History  Problem Relation Age of Onset   Colon cancer Mother 59   Colon polyps Mother    Colon cancer Paternal Grandfather    Alzheimer's disease Paternal Grandfather    Breast cancer Maternal Aunt    Diabetes Maternal Grandmother    Prostate cancer Maternal Grandfather    Esophageal cancer Neg Hx    Rectal cancer Neg Hx    Stomach cancer Neg Hx     ALLERGIES:  is allergic to bee pollen-1000-royal jelly [nutritional supplements].  MEDICATIONS:  Current Outpatient Medications  Medication Sig Dispense Refill   Ascorbic Acid (VITA-C PO) Take 1 tablet by mouth 2 (two) times daily.     Cholecalciferol (VITAMIN D-3) 125 MCG (5000 UT) TABS Take 5,000 Units by mouth 2 (two) times daily.     HYDROcodone-acetaminophen (NORCO) 7.5-325 MG tablet Take 1 tablet by mouth every 6 (six) hours as needed for  moderate pain. 50 tablet 0   ibuprofen (ADVIL) 200 MG tablet Take 400-600 mg by mouth every 6 (six) hours as needed for mild pain or moderate pain.     pantoprazole (PROTONIX) 40 MG tablet Take 1 tablet (40 mg total) by mouth daily before breakfast. (Patient not taking: Reported on 06/26/2021) 30 tablet 2   No current facility-administered medications for this visit.    REVIEW OF SYSTEMS:    10 Point review of Systems was done is negative except as noted above.  PHYSICAL EXAMINATION: ECOG PERFORMANCE STATUS: 1 - Symptomatic but completely ambulatory  . Vitals:   07/11/21 1253  BP: 114/81  Pulse: 74  Resp: 20  Temp: 97.9 F (36.6 C)  SpO2: 100%   Filed Weights   07/11/21 1253  Weight: 193 lb 11.2 oz (87.9 kg)   .Body mass index is 24.87 kg/m.  GENERAL:alert, in no acute distress and comfortable SKIN: no acute rashes, no significant lesions EYES: conjunctiva are pink and non-injected, sclera anicteric OROPHARYNX: MMM, no exudates, no oropharyngeal erythema or ulceration NECK: supple, no JVD LYMPH:  no palpable lymphadenopathy in the cervical, axillary or inguinal regions LUNGS: clear to  auscultation b/l with normal respiratory effort HEART: regular rate & rhythm ABDOMEN:  normoactive bowel sounds , tenderness with palpation in all areas of the abdomen, no guarding rigidity or rebound, no significant distention, no palpable hepatosplenomegaly. Extremity: no pedal edema PSYCH: alert & oriented x 3 with fluent speech NEURO: no focal motor/sensory deficits  LABORATORY DATA:   I have reviewed the data as listed  CBC Latest Ref Rng & Units 07/11/2021 06/26/2021 06/15/2021  WBC 4.0 - 10.5 K/uL 9.0 12.2(H) 6.5  Hemoglobin 13.0 - 17.0 g/dL 13.7 13.7 13.6  Hematocrit 39.0 - 52.0 % 41.3 41.3 40.2  Platelets 150 - 400 K/uL 381 345.0 291.0    . CMP Latest Ref Rng & Units 07/11/2021 06/26/2021 06/15/2021  Glucose 70 - 99 mg/dL 86 99 75  BUN 6 - 20 mg/dL 14 13 13   Creatinine  0.61 - 1.24 mg/dL 0.98 1.62(H) 0.97  Sodium 135 - 145 mmol/L 136 136 138  Potassium 3.5 - 5.1 mmol/L 4.3 4.5 4.0  Chloride 98 - 111 mmol/L 100 98 103  CO2 22 - 32 mmol/L 26 29 28   Calcium 8.9 - 10.3 mg/dL 9.6 9.6 9.6  Total Protein 6.5 - 8.1 g/dL 7.7 7.5 7.2  Total Bilirubin 0.3 - 1.2 mg/dL 0.4 0.6 0.3  Alkaline Phos 38 - 126 U/L 58 55 48  AST 15 - 41 U/L 16 11 12   ALT 0 - 44 U/L 15 8 10    Component     Latest Ref Rng & Units 07/11/2021  Color, Urine     YELLOW YELLOW  Appearance     CLEAR CLEAR  Specific Gravity, Urine     1.005 - 1.030 1.016  pH     5.0 - 8.0 5.0  Glucose, UA     NEGATIVE mg/dL NEGATIVE  Hgb urine dipstick     NEGATIVE NEGATIVE  Bilirubin Urine     NEGATIVE NEGATIVE  Ketones, ur     NEGATIVE mg/dL 20 (A)  Protein     NEGATIVE mg/dL NEGATIVE  Nitrite     NEGATIVE NEGATIVE  Leukocytes,Ua     NEGATIVE NEGATIVE  HIV Screen 4th Generation wRfx     Non Reactive Non Reactive  Hep B Core Total Ab     NON REACTIVE NON REACTIVE  Hepatitis B Surface Ag     NON REACTIVE NON REACTIVE  HCV Ab     NON REACTIVE NON REACTIVE  Uric Acid, Serum     3.7 - 8.6 mg/dL 8.0  LDH     98 - 192 U/L 300 (H)    RADIOGRAPHIC STUDIES: I have personally reviewed the radiological images as listed and agreed with the findings in the report. CT Abdomen Pelvis W Contrast  Result Date: 06/22/2021 CLINICAL DATA:  Left upper quadrant pain and nausea for 1 month. EXAM: CT ABDOMEN AND PELVIS WITH CONTRAST TECHNIQUE: Multidetector CT imaging of the abdomen and pelvis was performed using the standard protocol following bolus administration of intravenous contrast. CONTRAST:  167mL OMNIPAQUE IOHEXOL 350 MG/ML SOLN COMPARISON:  None. FINDINGS: Lower Chest: No acute findings. Hepatobiliary: No hepatic masses identified. Gallbladder is unremarkable. No evidence of biliary ductal dilatation. Pancreas:  No mass or inflammatory changes. Spleen: Within normal limits in size and appearance.  Adrenals/Urinary Tract: No masses identified. No evidence of ureteral calculi or hydronephrosis. Stomach/Bowel: Masslike mural wall thickening is seen involving a small bowel loop in the central pelvis, without evidence of obstruction. Vascular/Lymphatic: Bulky abdominal lymphadenopathy is seen throughout the abdominal  retroperitoneum and small bowel mesentery. Index lymph node mass in the small bowel mesentery measures 5.2 x 4.6 cm on image 40/2. No acute vascular findings. Reproductive:  No mass or other significant abnormality. Other:  None. Musculoskeletal:  No suspicious bone lesions identified. IMPRESSION: Bulky abdominal lymphadenopathy throughout retroperitoneum and small bowel mesentery. This is suspicious for lymphoma, with metastatic disease considered less likely. Masslike mural thickening involving a small bowel loop in the central pelvis, without evidence of bowel obstruction. This may be due to small bowel lymphoma, although adenocarcinoma cannot be excluded. These results will be called to the ordering clinician or representative by the Radiologist Assistant, and communication documented in the PACS or [employer]. Electronically Signed   By: Marlaine Hind M.D.   On: 06/22/2021 13:35   NM PET Image Initial (PI) Skull Base To Thigh (F-18 FDG)  Result Date: 07/04/2021 CLINICAL DATA:  Initial treatment strategy for non-Hodgkin's lymphoma. EXAM: NUCLEAR MEDICINE PET SKULL BASE TO THIGH TECHNIQUE: 9.6 mCi F-18 FDG was injected intravenously. Full-ring PET imaging was performed from the skull base to thigh after the radiotracer. CT data was obtained and used for attenuation correction and anatomic localization. Fasting blood glucose: 99 mg/dl COMPARISON:  CT abdomen pelvis 06/21/2021 FINDINGS: Mediastinal blood pool activity: SUV max 2.2 Liver activity: SUV max 3.5 NECK: Cluster of hypermetabolic RIGHT supraclavicular node with SUV max equal 6.3. Nodes measure 1 cm each. Smaller RIGHT level 3  lymph node on image 46 with SUV max equal 4.1. Incidental CT findings: none CHEST: Intensely hypermetabolic mediastinal adenopathy. Cluster of high RIGHT paratracheal nodes measuring 10 mm short axis with SUV max equal 7.3. Cluster of hypermetabolic 1 cm subcarinal nodes with SUV max equal 7.6. Sub solid nodule in the RIGHT upper lobe measuring 7 mm (image 73/4) has associated metabolic activity with SUV max equal 2.5. Similar mild activity associated with sub solid nodule measuring 13 mm image 73/4. Incidental CT findings: none ABDOMEN/PELVIS: Bulky mat of nodal tissue within the abdominal mesentery with SUV max equal 11.1. Individual nodes measure up to 2 cm. Hypermetabolic retroperitoneal adenopathy in the upper abdomen about the celiac trunk. Segment of thickened small bowel measuring 4.4 cm in length (image 180/4) with intense metabolic activity (SUV max equal 16.4 on image 182). No iliac or inguinal hypermetabolic lymph nodes. Incidental CT findings: none SKELETON: No focal hypermetabolic activity to suggest skeletal metastasis. Incidental CT findings: none IMPRESSION: 1. Hypermetabolic RIGHT supraclavicular and mediastinal adenopathy consistent with thoracic lymphoma. 2. Metabolic activity associated with two sub solid nodules in the RIGHT upper lobe is concerning for lymphoma involvement of the lung parenchyma. 3. Bulky hypermetabolic central mesenteric adenopathy and retroperitoneal adenopathy in the upper abdomen consistent with nodal lymphoma. 4. A 4.4 cm segment of hypermetabolic small bowel wall thickening consistent lymphoma involvement of the small-bowel. 5. No inguinal or iliac hypermetabolic nodes. 6. No spleen involvement or bone marrow involvement. Electronically Signed   By: Suzy Bouchard M.D.   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  Patient has extensive abdominal and retroperitoneal lymphadenopathy, right supraclavicular lymphadenopathy and lung nodules concerning for lymphoma involvement as well as concern for small bowel involvement. Patient's blood counts are within normal limits. No overt splenic involvement or bone marrow involvement on PET scan. LDH 300-this would be suggestive of a high-grade process. Patient's progression of symptoms over the  last 4 to 6 weeks also does not suggest a low-grade process.  2) pulmonary nodules concerning for lymphoma involvement 3) 4.4 cm small bowel thickening concerning for involvement by lymphoma.  Cannot rule out adenocarcinoma but less likely. 4) acute kidney injury.  Patient's creatinine had bumped to 1.6 but on repeat is back down to normal. Uric acid 8 5) Food intolerances including gluten and dairy avoidance.  PLAN -I discussed the available CT and PET results, EUS findings and pathology results in details with the patient and his wife in the clinic. -We discussed that the needle biopsy was consistent with non-Hodgkin's lymphomas but that there are several different types of non-Hodgkin's lymphoma and at least a core needle biopsy is necessary to subclassify his Hodgkin's lymphoma and to grade this to allow for defining definitive treatment plan. -We discussed options of IR guided core needle biopsy of his supraclavicular lymph node and if not possible of retroperitoneal lymph node versus referral to surgery for a surgical excisional lymph node biopsy of his supraclavicular lymph node.  Patient chose the latter. -We will get echocardiogram to evaluate heart function in preparation for treatment planning/chemotherapy. -Hepatitis profile and HIV testing -Other baseline labs as noted below -Patient might need a Port-A-Cath but it depends on the type of lymphoma.  We discussed that this is a low-grade lymphoma he would probably not need a port however if this is a  intermediate to high-grade lymphoma will likely need a port due to anthracycline based chemoimmunotherapy.  Follow-up Labs today ECHO in 3-5 days US guided biopsy of rt supraclavicular LN for lymphoma assessment RTC with Dr Irene Limbo in 10 days . Orders Placed This Encounter  Procedures   CT BIOPSY    Arne Cleveland, MD  [facility] [place]   CT core L paraaortic LAN  Supraclav looks too small/low for core bx   DDH           Standing Status:   Future    Standing Expiration Date:   07/11/2022    Order Specific Question:   Lab orders requested (DO NOT place separate lab orders, these will be automatically ordered during procedure specimen collection):    Answer:   Surgical Pathology    Order Specific Question:   Reason for Exam (SYMPTOM  OR DIAGNOSIS REQUIRED)    Answer:   Patient with concern non-Hodgkin's lymphoma for ultrasound-guided core needle biopsy of right supraclavicular lymph node which is FDG avid if not possible will need CT-guided biopsy of retroperitoneal lymph node    Comments:   Flow cytometry, molecular studies as needed    Order Specific Question:   Preferred location?    Answer:   [facility]   CBC with Differential/Platelet    Standing Status:   Future    Number of Occurrences:   1    Standing Expiration Date:   07/11/2022   CMP ([place] only)    Standing Status:   Future    Number of Occurrences:   1    Standing Expiration Date:   07/11/2022   Lactate dehydrogenase    Standing Status:   Future    Number of Occurrences:   1    Standing Expiration Date:   07/11/2022   Uric acid    Standing Status:   Future    Number of Occurrences:   1    Standing Expiration Date:   07/11/2022   Hepatitis C antibody    Standing Status:   Future  Number of Occurrences:   1    Standing Expiration Date:   07/11/2022   Hepatitis B surface antigen    Standing Status:   Future    Number of Occurrences:   1    Standing Expiration Date:   07/11/2022    Hepatitis B core antibody, total    Standing Status:   Future    Number of Occurrences:   1    Standing Expiration Date:   07/11/2022   HIV Antibody (routine testing w rflx)    Standing Status:   Future    Number of Occurrences:   1    Standing Expiration Date:   07/11/2022   Urinalysis    Standing Status:   Future    Number of Occurrences:   1    Standing Expiration Date:   07/11/2022   ECHOCARDIOGRAM COMPLETE    Standing Status:   Future    Standing Expiration Date:   09/10/2021    Order Specific Question:   Where should this test be performed    Answer:   [facility]    Order Specific Question:   Perflutren DEFINITY (image enhancing agent) should be administered unless hypersensitivity or allergy exist    Answer:   Administer Perflutren    Order Specific Question:   Reason for exam-Echo    Answer:   Chemo  Z09    Order Specific Question:   Other Comments    Answer:   Cardiac evaluation for ejection fraction prior to potentially cardiotoxic chemotherapy     All of the patients questions were answered with apparent satisfaction. The patient knows to call the clinic with any problems, questions or concerns.  I spent 40 minutes counseling the patient face to face. The total time spent in the appointment was 60 minutes and majority of the time was spent reviewing data, discussing available results with the patient including labs scans and pathology, coordination of care with GI, ordering additional work-up and discussing pulmonary diagnosis and potential treatment considerations.   Sullivan Lone MD [place] AAHIVMS [facility] Hematology/Oncology Physician [facility]  (Office):       [phone] (Work cell):  [phone] (Fax):           [phone]  07/11/2021 1:08 PM

## 2021-07-12 ENCOUNTER — Encounter: Payer: Self-pay | Admitting: Hematology

## 2021-07-12 ENCOUNTER — Encounter (HOSPITAL_COMMUNITY): Payer: Self-pay | Admitting: Radiology

## 2021-07-12 NOTE — Progress Notes (Signed)
Patient Name  Allen Hodge, Bozard Legal Sex  Male DOB  Apr 11, 1981 SSN  [id number] Address  [address]  [place] [address] Phone  [phone] (Home)  [phone] (Mobile) *Preferred*    RE: [place] CORE BIOPSY (LYMPH NODES) Received: Today Arne Cleveland, MD  [place], [place]   CT core L paraaortic LAN  Supraclav looks too small/low for core bx   DDH        Previous Messages   ----- Message -----  From: Garth Bigness D  Sent: 07/11/2021   5:51 PM EST  To: [facility] Procedure Requests  Subject: [place] CORE BIOPSY (LYMPH NODES)                   Procedure:  [place] CORE BIOPSY (LYMPH NODES)   Reason:  Non-Hodgkin lymphoma of lymph nodes of multiple regions, unspecified non-Hodgkin lymphoma type, Patient with concern non-Hodgkin's lymphoma for ultrasound-guided core needle biopsy of right supraclavicular lymph node which is FDG avid if not possible will need CT-guided biopsy of retroperitoneal lymph node   History:  NM PET, CT in computer   Provider:  Brunetta Genera   Provider Contact:  [phone]

## 2021-07-13 ENCOUNTER — Telehealth: Payer: Self-pay

## 2021-07-13 NOTE — Telephone Encounter (Signed)
Returned call to pt. Left message: Relayed to pt I was unable to get CT scheduled any earlier. Per Radiology pt would be contacted for any cancellations/open slots. They are aware this needs to be done ASAP. Let pt know I relayed his increase in pain to Dr Irene Limbo and that Dr Irene Limbo will send something in. Pt instructed to call back with questions or issues.

## 2021-07-14 MED ORDER — OXYCODONE HCL 5 MG PO TABS: 5.0000 mg | ORAL_TABLET | Freq: Four times a day (QID) | ORAL | 0 refills | Status: DC | PRN

## 2021-07-18 ENCOUNTER — Other Ambulatory Visit: Payer: Self-pay | Admitting: Radiology

## 2021-07-19 ENCOUNTER — Ambulatory Visit
Admission: RE | Admit: 2021-07-19 | Discharge: 2021-07-19 | Disposition: A | Discharge status: Home / Self Care | Payer: No Typology Code available for payment source | Source: Ambulatory Visit | Attending: Interventional Radiology | Admitting: Interventional Radiology

## 2021-07-19 ENCOUNTER — Other Ambulatory Visit: Payer: Self-pay

## 2021-07-19 ENCOUNTER — Other Ambulatory Visit: Payer: Self-pay | Admitting: Interventional Radiology

## 2021-07-19 ENCOUNTER — Ambulatory Visit
Admission: RE | Admit: 2021-07-19 | Discharge: 2021-07-19 | Disposition: A | Discharge status: Home / Self Care | Payer: No Typology Code available for payment source | Source: Ambulatory Visit | Attending: Hematology | Admitting: Hematology

## 2021-07-19 DIAGNOSIS — C8513 Unspecified B-cell lymphoma, intra-abdominal lymph nodes: Secondary | ICD-10-CM | POA: Diagnosis not present

## 2021-07-19 DIAGNOSIS — C8590 Non-Hodgkin lymphoma, unspecified, unspecified site: Secondary | ICD-10-CM | POA: Diagnosis present

## 2021-07-19 DIAGNOSIS — R109 Unspecified abdominal pain: Secondary | ICD-10-CM | POA: Insufficient documentation

## 2021-07-19 DIAGNOSIS — C8511 Unspecified B-cell lymphoma, lymph nodes of head, face, and neck: Secondary | ICD-10-CM | POA: Insufficient documentation

## 2021-07-19 DIAGNOSIS — C8598 Non-Hodgkin lymphoma, unspecified, lymph nodes of multiple sites: Secondary | ICD-10-CM

## 2021-07-19 LAB — PROTIME-INR
INR: 1.1 (ref 0.8–1.2)
Prothrombin Time: 13.7 seconds (ref 11.4–15.2)

## 2021-07-19 MED ORDER — MIDAZOLAM HCL 2 MG/2ML IJ SOLN
INTRAMUSCULAR | Status: AC | PRN
  Administered 2021-07-19 (×2): 1 mg via INTRAVENOUS

## 2021-07-19 MED ORDER — FENTANYL CITRATE (PF) 100 MCG/2ML IJ SOLN
INTRAMUSCULAR | Status: AC | PRN
Start: 2021-07-19 — End: 2021-07-19
  Administered 2021-07-19 (×2): 50 ug via INTRAVENOUS

## 2021-07-19 MED ORDER — MIDAZOLAM HCL 2 MG/2ML IJ SOLN
INTRAMUSCULAR | Status: AC
  Filled 2021-07-19: qty 2

## 2021-07-19 MED ORDER — SODIUM CHLORIDE 0.9 % IV SOLN: INTRAVENOUS | Status: DC

## 2021-07-19 MED ORDER — FENTANYL CITRATE (PF) 100 MCG/2ML IJ SOLN
INTRAMUSCULAR | Status: AC
  Filled 2021-07-19: qty 2

## 2021-07-19 NOTE — H&P (Signed)
Chief Complaint: Patient was seen in consultation today for malignant non-hodgkin lymphoma at the request of Brunetta Genera  Referring Physician(s): Brunetta Genera  Supervising Physician: Sandi Mariscal  Patient Status: [facility] - Out-pt  History of Present Illness: Allen Hodge is a 40 y.o. male with complaints of diffuse abdominal pain seen by GI with recent diagnosis of malignant non-hodgkins lymphoma after EUS biopsy on 11/17, sample insufficient to subclassify for treatment planning. Patient was seen by Oncology Dr. Irene Limbo on 11/29 with PET imaging done 11/22 and IR received request for image guided core biopsy of hypermetabolic left para aortic lymph node for further tissue sample for treatment planning.   The patient has had a H&P performed within the last 30 days, all history, medications, and exam have been reviewed. The patient denies any interval changes since the H&P.  The patient admits to diffuse abdominal pain today rated 7/10 which he has been taking hydrocodone for. He denies any current chest pain or shortness of breath. He denies any recent blood thinner use, he denies any known bleeding or clotting disorder. He has no known complications to sedation.    Past Medical History:  Diagnosis Date   Allergy    Cancer (Stem)    skin cancer   Herniated disc    Hx of skin cancer, basal cell    Hyperlipidemia    borderline- no meds     Past Surgical History:  Procedure Laterality Date   COLONOSCOPY     last 2013   ESOPHAGOGASTRODUODENOSCOPY (EGD) WITH PROPOFOL N/A 06/29/2021   Procedure: ESOPHAGOGASTRODUODENOSCOPY (EGD) WITH PROPOFOL;  Surgeon: Milus Banister, MD;  Location: [facility];  Service: Endoscopy;  Laterality: N/A;   EUS N/A 06/29/2021   Procedure: UPPER ENDOSCOPIC ULTRASOUND (EUS) RADIAL;  Surgeon: Milus Banister, MD;  Location: [facility];  Service: Endoscopy;  Laterality: N/A;   FINE NEEDLE ASPIRATION N/A 06/29/2021   Procedure: FINE  NEEDLE ASPIRATION (FNA) LINEAR;  Surgeon: Milus Banister, MD;  Location: [facility];  Service: Endoscopy;  Laterality: N/A;   VASECTOMY      Allergies: Bee pollen-1000-royal jelly [nutritional supplements]  Medications: Prior to Admission medications   Medication Sig Start Date End Date Taking? Authorizing Provider  Ascorbic Acid (VITA-C PO) Take 1 tablet by mouth 2 (two) times daily.   Yes [provider]  Cholecalciferol (VITAMIN D-3) 125 MCG (5000 UT) TABS Take 5,000 Units by mouth 2 (two) times daily.   Yes [provider]  ibuprofen (ADVIL) 200 MG tablet Take 400-600 mg by mouth every 6 (six) hours as needed for mild pain or moderate pain.   Yes [provider]  oxyCODONE (OXY IR/ROXICODONE) 5 MG immediate release tablet Take 1-2 tablets (5-10 mg total) by mouth every 6 (six) hours as needed for severe pain. 07/14/21  Yes Brunetta Genera, MD  pantoprazole (PROTONIX) 40 MG tablet Take 1 tablet (40 mg total) by mouth daily before breakfast. 06/15/21  Yes Esterwood, Amy S, PA-C     Family History  Problem Relation Age of Onset   Colon cancer Mother 34   Colon polyps Mother    Colon cancer Paternal Grandfather    Alzheimer's disease Paternal Grandfather    Breast cancer Maternal Aunt    Diabetes Maternal Grandmother    Prostate cancer Maternal Grandfather    Esophageal cancer Neg Hx    Rectal cancer Neg Hx    Stomach cancer Neg Hx     Social History   Socioeconomic History  Marital status: Married    Spouse name: Not on file   Number of children: 1   Years of education: Not on file   Highest education level: Not on file  Occupational History   Occupation: Sales   Tobacco Use   Smoking status: Never   Smokeless tobacco: Never  Substance and Sexual Activity   Alcohol use: Not Currently    Alcohol/week: 1.0 standard drink    Types: 1 Glasses of wine per week    Comment: Rare    Drug use: No   Sexual activity: Not on file  Other  Topics Concern   Not on file  Social History Narrative   Daily caffeine    Social Determinants of Health   Financial Resource Strain: Not on file  Food Insecurity: Not on file  Transportation Needs: Not on file  Physical Activity: Not on file  Stress: Not on file  Social Connections: Not on file   Review of Systems: A 12 point ROS discussed and pertinent positives are indicated in the HPI above.  All other systems are negative.  Review of Systems  Vital Signs: BP 125/78   Pulse 71   Temp 98 F (36.7 C) (Oral)   Resp 18   SpO2 99%   Physical Exam Constitutional:      Appearance: Normal appearance.  HENT:     Head: Normocephalic and atraumatic.  Cardiovascular:     Rate and Rhythm: Normal rate and regular rhythm.  Pulmonary:     Effort: Pulmonary effort is normal. No respiratory distress.  Neurological:     Mental Status: He is alert and oriented to person, place, and time.  Psychiatric:        Behavior: Behavior normal.        Thought Content: Thought content normal.    Imaging: CT Abdomen Pelvis W Contrast  Result Date: 06/22/2021 CLINICAL DATA:  Left upper quadrant pain and nausea for 1 month. EXAM: CT ABDOMEN AND PELVIS WITH CONTRAST TECHNIQUE: Multidetector CT imaging of the abdomen and pelvis was performed using the standard protocol following bolus administration of intravenous contrast. CONTRAST:  166mL OMNIPAQUE IOHEXOL 350 MG/ML SOLN COMPARISON:  None. FINDINGS: Lower Chest: No acute findings. Hepatobiliary: No hepatic masses identified. Gallbladder is unremarkable. No evidence of biliary ductal dilatation. Pancreas:  No mass or inflammatory changes. Spleen: Within normal limits in size and appearance. Adrenals/Urinary Tract: No masses identified. No evidence of ureteral calculi or hydronephrosis. Stomach/Bowel: Masslike mural wall thickening is seen involving a small bowel loop in the central pelvis, without evidence of obstruction. Vascular/Lymphatic: Bulky  abdominal lymphadenopathy is seen throughout the abdominal retroperitoneum and small bowel mesentery. Index lymph node mass in the small bowel mesentery measures 5.2 x 4.6 cm on image 40/2. No acute vascular findings. Reproductive:  No mass or other significant abnormality. Other:  None. Musculoskeletal:  No suspicious bone lesions identified. IMPRESSION: Bulky abdominal lymphadenopathy throughout retroperitoneum and small bowel mesentery. This is suspicious for lymphoma, with metastatic disease considered less likely. Masslike mural thickening involving a small bowel loop in the central pelvis, without evidence of bowel obstruction. This may be due to small bowel lymphoma, although adenocarcinoma cannot be excluded. These results will be called to the ordering clinician or representative by the Radiologist Assistant, and communication documented in the PACS or [employer]. Electronically Signed   By: Marlaine Hind M.D.   On: 06/22/2021 13:35   NM PET Image Initial (PI) Skull Base To Thigh (F-18 FDG)  Result Date: 07/04/2021  CLINICAL DATA:  Initial treatment strategy for non-Hodgkin's lymphoma. EXAM: NUCLEAR MEDICINE PET SKULL BASE TO THIGH TECHNIQUE: 9.6 mCi F-18 FDG was injected intravenously. Full-ring PET imaging was performed from the skull base to thigh after the radiotracer. CT data was obtained and used for attenuation correction and anatomic localization. Fasting blood glucose: 99 mg/dl COMPARISON:  CT abdomen pelvis 06/21/2021 FINDINGS: Mediastinal blood pool activity: SUV max 2.2 Liver activity: SUV max 3.5 NECK: Cluster of hypermetabolic RIGHT supraclavicular node with SUV max equal 6.3. Nodes measure 1 cm each. Smaller RIGHT level 3 lymph node on image 46 with SUV max equal 4.1. Incidental CT findings: none CHEST: Intensely hypermetabolic mediastinal adenopathy. Cluster of high RIGHT paratracheal nodes measuring 10 mm short axis with SUV max equal 7.3. Cluster of hypermetabolic 1 cm subcarinal  nodes with SUV max equal 7.6. Sub solid nodule in the RIGHT upper lobe measuring 7 mm (image 73/4) has associated metabolic activity with SUV max equal 2.5. Similar mild activity associated with sub solid nodule measuring 13 mm image 73/4. Incidental CT findings: none ABDOMEN/PELVIS: Bulky mat of nodal tissue within the abdominal mesentery with SUV max equal 11.1. Individual nodes measure up to 2 cm. Hypermetabolic retroperitoneal adenopathy in the upper abdomen about the celiac trunk. Segment of thickened small bowel measuring 4.4 cm in length (image 180/4) with intense metabolic activity (SUV max equal 16.4 on image 182). No iliac or inguinal hypermetabolic lymph nodes. Incidental CT findings: none SKELETON: No focal hypermetabolic activity to suggest skeletal metastasis. Incidental CT findings: none IMPRESSION: 1. Hypermetabolic RIGHT supraclavicular and mediastinal adenopathy consistent with thoracic lymphoma. 2. Metabolic activity associated with two sub solid nodules in the RIGHT upper lobe is concerning for lymphoma involvement of the lung parenchyma. 3. Bulky hypermetabolic central mesenteric adenopathy and retroperitoneal adenopathy in the upper abdomen consistent with nodal lymphoma. 4. A 4.4 cm segment of hypermetabolic small bowel wall thickening consistent lymphoma involvement of the small-bowel. 5. No inguinal or iliac hypermetabolic nodes. 6. No spleen involvement or bone marrow involvement. Electronically Signed   By: Suzy Bouchard M.D.   On: 07/04/2021 16:32    Labs: INR pending at time of note  CBC: Recent Labs    06/15/21 1518 06/26/21 1210 07/11/21 1422  WBC 6.5 12.2* 9.0  HGB 13.6 13.7 13.7  HCT 40.2 41.3 41.3  PLT 291.0 345.0 381    COAGS: No results for input(s): INR, APTT in the last 8760 hours.  BMP: Recent Labs    06/15/21 1518 06/26/21 1210 07/11/21 1422  NA 138 136 136  K 4.0 4.5 4.3  CL 103 98 100  CO2 28 29 26   GLUCOSE 75 99 86  BUN 13 13 14   CALCIUM  9.6 9.6 9.6  CREATININE 0.97 1.62* 0.98  GFRNONAA  --   --  >60    LIVER FUNCTION TESTS: Recent Labs    06/15/21 1518 06/26/21 1210 07/11/21 1422  BILITOT 0.3 0.6 0.4  AST 12 11 16   ALT 10 8 15   ALKPHOS 48 55 58  PROT 7.2 7.5 7.7  ALBUMIN 4.4 4.3 4.3    Assessment and Plan: 40 year old male with complaints of diffuse abdominal pain seen by GI with recent diagnosis of malignant non-hodgkins lymphoma after EUS biopsy on 11/17, sample insufficient to subclassify for treatment planning. Patient was seen by Oncology Dr. Irene Limbo on 11/29 with PET imaging done 11/22 and IR received request for image guided core biopsy of hypermetabolic left para aortic lymph node for further tissue  sample for treatment planning.   The patient has been NPO, no blood thinners taken, imaging, labs and vitals have been reviewed.  Risks and benefits of CT guided left para aortic lymph node core biopsy with moderate sedation was discussed with the patient and/or patient's family including, but not limited to bleeding, infection, damage to adjacent structures or low yield requiring additional tests.  All of the questions were answered and there is agreement to proceed.  Consent signed and in chart.   Thank you for this interesting consult.  I greatly enjoyed meeting Allen Hodge and look forward to participating in their care.  A copy of this report was sent to the requesting provider on this date.  Electronically Signed: Hedy Jacob, PA-C 07/19/2021, 10:20 AM   I spent a total of 15 Minutes in face to face in clinical consultation, greater than 50% of which was counseling/coordinating care for malignant non-hodgkin's lymphoma and lymphadenopathy.

## 2021-07-19 NOTE — Procedures (Signed)
Pre Procedure Dx: Malignant non-hodgkins lymphoma  Post Procedural Dx: Same  Technically successful CT guided biopsy of hypermetabolic mesenteric nodal conglomeration.  Technically successful US guided biopsy of hypermetabolic right supraclavicular lymph node.  EBL: Trace No immediate complications.   Ronny Bacon, MD Pager #: [phone]

## 2021-07-20 ENCOUNTER — Other Ambulatory Visit: Payer: Self-pay | Admitting: Hematology

## 2021-07-20 MED ORDER — PREDNISONE 20 MG PO TABS
60.0000 mg | ORAL_TABLET | Freq: Every day | ORAL | 0 refills | Status: AC
Start: 2021-07-20 — End: 2021-07-27

## 2021-07-21 ENCOUNTER — Encounter: Payer: Self-pay | Admitting: Hematology

## 2021-07-21 ENCOUNTER — Other Ambulatory Visit: Payer: Self-pay

## 2021-07-21 ENCOUNTER — Inpatient Hospital Stay: Payer: No Typology Code available for payment source | Attending: Hematology | Admitting: Hematology

## 2021-07-21 VITALS — BP 118/85 | HR 74 | Temp 97.7°F | Resp 20 | Wt 188.9 lb

## 2021-07-21 DIAGNOSIS — C8338 Diffuse large B-cell lymphoma, lymph nodes of multiple sites: Secondary | ICD-10-CM | POA: Diagnosis not present

## 2021-07-21 DIAGNOSIS — Z5189 Encounter for other specified aftercare: Secondary | ICD-10-CM | POA: Insufficient documentation

## 2021-07-21 DIAGNOSIS — Z5112 Encounter for antineoplastic immunotherapy: Secondary | ICD-10-CM | POA: Diagnosis present

## 2021-07-21 DIAGNOSIS — Z5111 Encounter for antineoplastic chemotherapy: Secondary | ICD-10-CM | POA: Diagnosis present

## 2021-07-21 DIAGNOSIS — C8590 Non-Hodgkin lymphoma, unspecified, unspecified site: Secondary | ICD-10-CM | POA: Diagnosis present

## 2021-07-21 LAB — SURGICAL PATHOLOGY

## 2021-07-21 MED ORDER — LIDOCAINE-PRILOCAINE 2.5-2.5 % EX CREA: TOPICAL_CREAM | CUTANEOUS | 3 refills | Status: DC

## 2021-07-21 MED ORDER — PREDNISONE 20 MG PO TABS: 60.0000 mg | ORAL_TABLET | Freq: Every day | ORAL | 5 refills | Status: DC

## 2021-07-21 MED ORDER — PROCHLORPERAZINE MALEATE 10 MG PO TABS: 10.0000 mg | ORAL_TABLET | Freq: Four times a day (QID) | ORAL | 6 refills | Status: DC | PRN

## 2021-07-21 MED ORDER — ONDANSETRON HCL 8 MG PO TABS: 8.0000 mg | ORAL_TABLET | Freq: Two times a day (BID) | ORAL | 1 refills | Status: DC | PRN

## 2021-07-21 MED ORDER — ALLOPURINOL 100 MG PO TABS: 100.0000 mg | ORAL_TABLET | Freq: Two times a day (BID) | ORAL | 0 refills | Status: DC

## 2021-07-21 MED ORDER — LORAZEPAM 0.5 MG PO TABS: 0.5000 mg | ORAL_TABLET | Freq: Four times a day (QID) | ORAL | 0 refills | Status: DC | PRN

## 2021-07-21 NOTE — Progress Notes (Signed)
Port placement scheduled for 08/04/21 arrive at 12:30 pm main entrance of [facility]. Pt to be NPO after 8 am. Pt and wife contacted. Wife acknowledged date and time . Verbalized understanding of NPO status and needing a driver.

## 2021-07-21 NOTE — Progress Notes (Signed)

## 2021-07-24 ENCOUNTER — Other Ambulatory Visit: Payer: Self-pay | Admitting: Hematology

## 2021-07-24 ENCOUNTER — Ambulatory Visit (HOSPITAL_COMMUNITY): Payer: No Typology Code available for payment source

## 2021-07-24 ENCOUNTER — Telehealth: Payer: Self-pay | Admitting: Hematology

## 2021-07-24 ENCOUNTER — Other Ambulatory Visit: Payer: Self-pay

## 2021-07-24 MED ORDER — HYDROCODONE-ACETAMINOPHEN 5-325 MG PO TABS: 1.0000 | ORAL_TABLET | Freq: Four times a day (QID) | ORAL | 0 refills | Status: DC | PRN

## 2021-07-24 NOTE — Progress Notes (Signed)
Norco refill sent to patient's pharmacy. This replaces oxycodone.

## 2021-07-24 NOTE — Telephone Encounter (Signed)
Scheduled follow-up appointments per 12/9 los. Patient is aware.

## 2021-07-25 ENCOUNTER — Ambulatory Visit (HOSPITAL_COMMUNITY)
Admission: RE | Admit: 2021-07-25 | Discharge: 2021-07-25 | Disposition: A | Discharge status: Home / Self Care | Payer: No Typology Code available for payment source | Source: Ambulatory Visit | Attending: Hematology | Admitting: Hematology

## 2021-07-25 ENCOUNTER — Encounter: Payer: Self-pay | Admitting: Hematology

## 2021-07-25 ENCOUNTER — Other Ambulatory Visit: Payer: Self-pay

## 2021-07-25 ENCOUNTER — Other Ambulatory Visit: Payer: Self-pay | Admitting: Radiology

## 2021-07-25 DIAGNOSIS — C8598 Non-Hodgkin lymphoma, unspecified, lymph nodes of multiple sites: Secondary | ICD-10-CM | POA: Diagnosis not present

## 2021-07-25 DIAGNOSIS — Z01818 Encounter for other preprocedural examination: Secondary | ICD-10-CM | POA: Insufficient documentation

## 2021-07-25 DIAGNOSIS — C8338 Diffuse large B-cell lymphoma, lymph nodes of multiple sites: Secondary | ICD-10-CM

## 2021-07-25 DIAGNOSIS — Z0189 Encounter for other specified special examinations: Secondary | ICD-10-CM | POA: Diagnosis not present

## 2021-07-25 LAB — ECHOCARDIOGRAM COMPLETE
AR max vel: 3.4 cm2
AV Area VTI: 3.3 cm2
AV Area mean vel: 3.17 cm2
AV Mean grad: 4 mmHg
AV Peak grad: 6.9 mmHg
Ao pk vel: 1.31 m/s
Area-P 1/2: 4.8 cm2
S' Lateral: 3.5 cm

## 2021-07-25 NOTE — Progress Notes (Signed)
°  Echocardiogram 2D Echocardiogram has been performed.  Allen Hodge F 07/25/2021, 9:48 AM

## 2021-07-25 NOTE — H&P (Signed)
Chief Complaint: Patient was seen in consultation today for port placement.  Referring Physician(s): Brunetta Genera  Supervising Physician: Aletta Edouard  Patient Status: [facility] - Out-pt  History of Present Illness: Allen Hodge is a 40 y.o. male with a past medical history significant for non-hodgkin's lymphoma who presents today for port placement. Allen Hodge was diagnosed with non-hodgkin's lymphoma after EUS biopsy with GI on 06/29/21, unfortunately this sample was insufficient for treatment planning purposes and he underwent left para aortic lymph node biopsy in IR on 12/7. He is planning to undergo systemic therapy and IR has been asked to place a port for durable venous access.  Allen Hodge denies any complaints today, he feels well overall and his pain is well controlled. His first treatment is planned for tomorrow. He understands the procedure today and is agreeable to proceed as planned.  Past Medical History:  Diagnosis Date   Allergy    Cancer ([place])    skin cancer   Herniated disc    Hx of skin cancer, basal cell    Hyperlipidemia    borderline- no meds     Past Surgical History:  Procedure Laterality Date   COLONOSCOPY     last 2013   ESOPHAGOGASTRODUODENOSCOPY (EGD) WITH PROPOFOL N/A 06/29/2021   Procedure: ESOPHAGOGASTRODUODENOSCOPY (EGD) WITH PROPOFOL;  Surgeon: Milus Banister, MD;  Location: [facility] ENDOSCOPY;  Service: Endoscopy;  Laterality: N/A;   EUS N/A 06/29/2021   Procedure: UPPER ENDOSCOPIC ULTRASOUND (EUS) RADIAL;  Surgeon: Milus Banister, MD;  Location: [facility] ENDOSCOPY;  Service: Endoscopy;  Laterality: N/A;   FINE NEEDLE ASPIRATION N/A 06/29/2021   Procedure: FINE NEEDLE ASPIRATION (FNA) LINEAR;  Surgeon: Milus Banister, MD;  Location: [facility] ENDOSCOPY;  Service: Endoscopy;  Laterality: N/A;   VASECTOMY      Allergies: Bee pollen-1000-royal jelly [nutritional supplements], Gluten meal, and Lactose intolerance  (gi)  Medications: Prior to Admission medications   Medication Sig Start Date End Date Taking? Authorizing Provider  allopurinol (ZYLOPRIM) 100 MG tablet Take 1 tablet (100 mg total) by mouth 2 (two) times daily. 07/21/21   Brunetta Genera, MD  Ascorbic Acid (VITA-C PO) Take 1 tablet by mouth 2 (two) times daily.    [provider]  Cholecalciferol (VITAMIN D-3) 125 MCG (5000 UT) TABS Take 5,000 Units by mouth 2 (two) times daily.    [provider]  HYDROcodone-acetaminophen (NORCO) 5-325 MG tablet Take 1-2 tablets by mouth every 6 (six) hours as needed for moderate pain. 07/24/21   Brunetta Genera, MD  ibuprofen (ADVIL) 200 MG tablet Take 400-600 mg by mouth every 6 (six) hours as needed for mild pain or moderate pain.    [provider]  lidocaine-prilocaine (EMLA) cream Apply to affected area once 07/21/21   Brunetta Genera, MD  LORazepam (ATIVAN) 0.5 MG tablet Take 1 tablet (0.5 mg total) by mouth every 6 (six) hours as needed (Nausea or vomiting). 07/21/21   Brunetta Genera, MD  ondansetron (ZOFRAN) 8 MG tablet Take 1 tablet (8 mg total) by mouth 2 (two) times daily as needed for refractory nausea / vomiting. Start on day 3 after cyclophosphamide chemotherapy. 07/21/21   Brunetta Genera, MD  pantoprazole (PROTONIX) 40 MG tablet Take 1 tablet (40 mg total) by mouth daily before breakfast. 06/15/21   Esterwood, Amy S, PA-C  predniSONE (DELTASONE) 20 MG tablet Take 3 tablets (60 mg total) by mouth daily with breakfast for 7 days. 07/20/21 07/27/21  Brunetta Genera, MD  predniSONE (DELTASONE) 20 MG tablet Take 3 tablets (60 mg total) by mouth daily. Take with food on days 2-6 of chemotherapy. 07/21/21   Brunetta Genera, MD  prochlorperazine (COMPAZINE) 10 MG tablet Take 1 tablet (10 mg total) by mouth every 6 (six) hours as needed (Nausea or vomiting). 07/21/21   Brunetta Genera, MD     Family History  Problem Relation Age of Onset    Colon cancer Mother 24   Colon polyps Mother    Colon cancer Paternal Grandfather    Alzheimer's disease Paternal Grandfather    Breast cancer Maternal Aunt    Diabetes Maternal Grandmother    Prostate cancer Maternal Grandfather    Esophageal cancer Neg Hx    Rectal cancer Neg Hx    Stomach cancer Neg Hx     Social History   Socioeconomic History   Marital status: Married    Spouse name: Not on file   Number of children: 1   Years of education: Not on file   Highest education level: Not on file  Occupational History   Occupation: Sales   Tobacco Use   Smoking status: Never   Smokeless tobacco: Never  Substance and Sexual Activity   Alcohol use: Not Currently    Alcohol/week: 1.0 standard drink    Types: 1 Glasses of wine per week    Comment: Rare    Drug use: No   Sexual activity: Not on file  Other Topics Concern   Not on file  Social History Narrative   Daily caffeine    Social Determinants of Health   Financial Resource Strain: Not on file  Food Insecurity: Not on file  Transportation Needs: Not on file  Physical Activity: Not on file  Stress: Not on file  Social Connections: Not on file     Review of Systems: A 12 point ROS discussed and pertinent positives are indicated in the HPI above.  All other systems are negative.  Review of Systems  Constitutional:  Negative for chills and fever.  Respiratory:  Negative for cough and shortness of breath.   Cardiovascular:  Negative for chest pain.  Gastrointestinal:  Negative for abdominal pain, nausea and vomiting.  Musculoskeletal:  Negative for back pain.  Neurological:  Negative for headaches.   Vital Signs: BP 117/83    Pulse 66    Temp 98.3 F (36.8 C) (Oral)    Resp 14    SpO2 100%   Physical Exam Vitals reviewed.  Constitutional:      General: He is not in acute distress. HENT:     Head: Normocephalic.     Mouth/Throat:     Mouth: Mucous membranes are moist.     Pharynx: Oropharynx is clear.  No oropharyngeal exudate or posterior oropharyngeal erythema.  Cardiovascular:     Rate and Rhythm: Normal rate and regular rhythm.  Pulmonary:     Effort: Pulmonary effort is normal.     Breath sounds: Normal breath sounds.  Abdominal:     General: There is no distension.     Palpations: Abdomen is soft.     Tenderness: There is no abdominal tenderness.  Skin:    General: Skin is warm and dry.  Neurological:     Mental Status: He is alert and oriented to person, place, and time.  Psychiatric:        Mood and Affect: Mood normal.        Behavior: Behavior normal.  Thought Content: Thought content normal.        Judgment: Judgment normal.     MD Evaluation Airway: WNL Heart: WNL Abdomen: WNL Chest/ Lungs: WNL ASA  Classification: 2 Mallampati/Airway Score: One   Imaging: NM PET Image Initial (PI) Skull Base To Thigh (F-18 FDG)  Result Date: 07/04/2021 CLINICAL DATA:  Initial treatment strategy for non-Hodgkin's lymphoma. EXAM: NUCLEAR MEDICINE PET SKULL BASE TO THIGH TECHNIQUE: 9.6 mCi F-18 FDG was injected intravenously. Full-ring PET imaging was performed from the skull base to thigh after the radiotracer. CT data was obtained and used for attenuation correction and anatomic localization. Fasting blood glucose: 99 mg/dl COMPARISON:  CT abdomen pelvis 06/21/2021 FINDINGS: Mediastinal blood pool activity: SUV max 2.2 Liver activity: SUV max 3.5 NECK: Cluster of hypermetabolic RIGHT supraclavicular node with SUV max equal 6.3. Nodes measure 1 cm each. Smaller RIGHT level 3 lymph node on image 46 with SUV max equal 4.1. Incidental CT findings: none CHEST: Intensely hypermetabolic mediastinal adenopathy. Cluster of high RIGHT paratracheal nodes measuring 10 mm short axis with SUV max equal 7.3. Cluster of hypermetabolic 1 cm subcarinal nodes with SUV max equal 7.6. Sub solid nodule in the RIGHT upper lobe measuring 7 mm (image 73/4) has associated metabolic activity with SUV max  equal 2.5. Similar mild activity associated with sub solid nodule measuring 13 mm image 73/4. Incidental CT findings: none ABDOMEN/PELVIS: Bulky mat of nodal tissue within the abdominal mesentery with SUV max equal 11.1. Individual nodes measure up to 2 cm. Hypermetabolic retroperitoneal adenopathy in the upper abdomen about the celiac trunk. Segment of thickened small bowel measuring 4.4 cm in length (image 180/4) with intense metabolic activity (SUV max equal 16.4 on image 182). No iliac or inguinal hypermetabolic lymph nodes. Incidental CT findings: none SKELETON: No focal hypermetabolic activity to suggest skeletal metastasis. Incidental CT findings: none IMPRESSION: 1. Hypermetabolic RIGHT supraclavicular and mediastinal adenopathy consistent with thoracic lymphoma. 2. Metabolic activity associated with two sub solid nodules in the RIGHT upper lobe is concerning for lymphoma involvement of the lung parenchyma. 3. Bulky hypermetabolic central mesenteric adenopathy and retroperitoneal adenopathy in the upper abdomen consistent with nodal lymphoma. 4. A 4.4 cm segment of hypermetabolic small bowel wall thickening consistent lymphoma involvement of the small-bowel. 5. No inguinal or iliac hypermetabolic nodes. 6. No spleen involvement or bone marrow involvement. Electronically Signed   By: Suzy Bouchard M.D.   On: 07/04/2021 16:32   CT BIOPSY  Result Date: 07/19/2021 INDICATION: Recent diagnosis non-Hodgkin's lymphoma, post endoscopic biopsy though with insignificant tissue sampling to allow for definitive characterization. Please perform CT-guided mesenteric mass biopsy and or ultrasound-guided right subclavicular navicular biopsy for tissue diagnostic purposes. EXAM: 1. ULTRASOUND-GUIDED RIGHT SUPRACLAVICULAR LYMPH NODE BIOPSY 2. CT-GUIDED MESENTERIC NODAL CONGLOMERATION BIOPSY COMPARISON:  PET-CT-07/04/2021; CT abdomen and pelvis-06/21/2021 MEDICATIONS: None. ANESTHESIA/SEDATION: Moderate (conscious)  sedation was employed during this procedure as administered by the Interventional Radiology RN. A total of Versed 2 mg and Fentanyl 100 mcg was administered intravenously. Moderate Sedation Time: 26 minutes. The patient's level of consciousness and vital signs were monitored continuously by radiology nursing throughout the procedure under my direct supervision. CONTRAST:  None. COMPLICATIONS: None immediate. PROCEDURE: Informed consent was obtained from the patient following an explanation of the procedure, risks, benefits and alternatives. A time out was performed prior to the initiation of the procedure. Sonographic evaluation was performed of the right supraclavicular fossa demonstrating several pathologically enlarged lymph nodes clustered within the medial aspect of the right supraclavicular fossa. The  patient was then positioned supine on the CT gantry with preprocedural CT imaging demonstrating continued progression of mesenteric root and retroperitoneal lymphadenopathy with index mesenteric root nodal conglomeration measuring at least 10.7 x 8.0 cm (image 33, series 2). In an effort to establish a tissue diagnosis in the most expeditious manner decision was made to proceed with CT-guided biopsy of the mesenteric mass as well as ultrasound-guided biopsy of a dominant right supraclavicular lymph node. Attention was first paid towards CT-guided biopsy of the mesenteric mass. The procedure was planned. The operative site was prepped and draped in the usual sterile fashion. Appropriate trajectory was confirmed with a 22 gauge spinal needle after the adjacent tissues were anesthetized with 1% Lidocaine with epinephrine. Under intermittent CT guidance, a 17 gauge coaxial needle was advanced into the peripheral aspect of the mesenteric nodal conglomeration. Appropriate positioning was confirmed and 6 core needle biopsy samples were obtained with an 18 gauge core needle biopsy device. The co-axial needle was removed  following the administration of a Gel-Foam slurry and superficial hemostasis was achieved with manual compression. Limited postprocedural CT was negative for hemorrhage or additional complication. A dressing was applied. _________________________________________________________ Attention was now paid towards ultrasound-guided biopsy of a dominant right supraclavicular lymph node. The operative site was prepped and draped in usual sterile fashion. After the overlying soft tissues were anesthetized with 1% lidocaine, 6 core needle biopsy samples were obtained with an 18 gauge core needle biopsy device under direct ultrasound guidance. Multiple ultrasound images were saved procedural documentation purposes. Postprocedural imaging was negative for hemorrhage or additional complication. A dressing was applied The patient tolerated the above procedures well without immediate postprocedural complication. The patient tolerated the procedure well without immediate postprocedural complication. IMPRESSION: 1. Technically successful CT guided core needle biopsy of mesenteric nodal conglomeration. 2. Technically successful ultrasound-guided biopsy of dominant right supraclavicular lymph node. Electronically Signed   By: Sandi Mariscal M.D.   On: 07/19/2021 15:30   ECHOCARDIOGRAM COMPLETE  Result Date: 07/25/2021    ECHOCARDIOGRAM REPORT   Patient Name:   Allen Hodge Date of Exam: 07/25/2021 Medical Rec #:  [id number]          Height:       74.0 in Accession #:    [id number]         Weight:       188.9 lb Date of Birth:  Apr 08, 1981          BSA:          2.121 m Patient Age:    32 years           BP:           127/87 mmHg Patient Gender: M                  HR:           64 bpm. Exam Location:  Outpatient Procedure: 2D Echo, Cardiac Doppler, Color Doppler and Strain Analysis Indications:    Pre-Chemo  History:        Patient has no prior history of Echocardiogram examinations.                 Lymphoma.  Sonographer:     Merrie Roof RDCS Referring Phys: [id number] [place]  1. Left ventricular ejection fraction, by estimation, is 60 to 65%. The left ventricle has normal function. The left ventricle has no regional wall motion abnormalities. Left ventricular diastolic parameters were normal. The average left ventricular  global longitudinal strain is -21.4 %. The global longitudinal strain is normal.  2. Right ventricular systolic function is normal. The right ventricular size is normal.  3. ? Port/catheter seen in RA.  4. The mitral valve is normal in structure. No evidence of mitral valve regurgitation. No evidence of mitral stenosis.  5. The aortic valve is tricuspid. Aortic valve regurgitation is not visualized. No aortic stenosis is present.  6. The inferior vena cava is normal in size with greater than 50% respiratory variability, suggesting right atrial pressure of 3 mmHg. FINDINGS  Left Ventricle: Left ventricular ejection fraction, by estimation, is 60 to 65%. The left ventricle has normal function. The left ventricle has no regional wall motion abnormalities. The average left ventricular global longitudinal strain is -21.4 %. The global longitudinal strain is normal. The left ventricular internal cavity size was normal in size. There is no left ventricular hypertrophy. Left ventricular diastolic parameters were normal. Right Ventricle: The right ventricular size is normal. No increase in right ventricular wall thickness. Right ventricular systolic function is normal. Left Atrium: Left atrial size was normal in size. Right Atrium: ? Port/catheter seen in RA. Right atrial size was normal in size. Pericardium: There is no evidence of pericardial effusion. Mitral Valve: The mitral valve is normal in structure. No evidence of mitral valve regurgitation. No evidence of mitral valve stenosis. Tricuspid Valve: The tricuspid valve is normal in structure. Tricuspid valve regurgitation is not demonstrated. No  evidence of tricuspid stenosis. Aortic Valve: The aortic valve is tricuspid. Aortic valve regurgitation is not visualized. No aortic stenosis is present. Aortic valve mean gradient measures 4.0 mmHg. Aortic valve peak gradient measures 6.9 mmHg. Aortic valve area, by VTI measures 3.30 cm. Pulmonic Valve: The pulmonic valve was normal in structure. Pulmonic valve regurgitation is not visualized. No evidence of pulmonic stenosis. Aorta: The aortic root is normal in size and structure. Venous: The inferior vena cava is normal in size with greater than 50% respiratory variability, suggesting right atrial pressure of 3 mmHg. IAS/Shunts: No atrial level shunt detected by color flow Doppler.  LEFT VENTRICLE PLAX 2D LVIDd:         5.40 cm   Diastology LVIDs:         3.50 cm   LV e' medial:    13.70 cm/s LV PW:         0.90 cm   LV E/e' medial:  6.8 LV IVS:        0.70 cm   LV e' lateral:   17.40 cm/s LVOT diam:     2.20 cm   LV E/e' lateral: 5.4 LV SV:         82 LV SV Index:   39        2D Longitudinal Strain LVOT Area:     3.80 cm  2D Strain GLS Avg:     -21.4 %                           3D Volume EF:                          3D EF:        61 %                          LV EDV:       144 ml  LV ESV:       56 ml                          LV SV:        88 ml RIGHT VENTRICLE RV Basal diam:  4.10 cm RV Mid diam:    3.40 cm LEFT ATRIUM             Index        RIGHT ATRIUM           Index LA diam:        3.60 cm 1.70 cm/m   RA Area:     26.00 cm LA Vol (A2C):   58.4 ml 27.53 ml/m  RA Volume:   97.20 ml  45.82 ml/m LA Vol (A4C):   55.1 ml 25.97 ml/m LA Biplane Vol: 57.6 ml 27.15 ml/m  AORTIC VALVE AV Area (Vmax):    3.40 cm AV Area (Vmean):   3.17 cm AV Area (VTI):     3.30 cm AV Vmax:           131.00 cm/s AV Vmean:          87.300 cm/s AV VTI:            0.250 m AV Peak Grad:      6.9 mmHg AV Mean Grad:      4.0 mmHg LVOT Vmax:         117.00 cm/s LVOT Vmean:        72.700 cm/s LVOT VTI:           0.217 m LVOT/AV VTI ratio: 0.87  AORTA Ao Root diam: 3.30 cm Ao Asc diam:  3.00 cm MITRAL VALVE MV Area (PHT): 4.80 cm    SHUNTS MV Decel Time: 158 msec    Systemic VTI:  0.22 m MV E velocity: 93.70 cm/s  Systemic Diam: 2.20 cm MV A velocity: 53.70 cm/s MV E/A ratio:  1.74 Jenkins Rouge MD Electronically signed by Jenkins Rouge MD Signature Date/Time: 07/25/2021/1:26:41 PM    Final    [place] CORE BIOPSY (LYMPH NODES)  Result Date: 07/19/2021 INDICATION: Recent diagnosis non-Hodgkin's lymphoma, post endoscopic biopsy though with insignificant tissue sampling to allow for definitive characterization. Please perform CT-guided mesenteric mass biopsy and or ultrasound-guided right subclavicular navicular biopsy for tissue diagnostic purposes. EXAM: 1. ULTRASOUND-GUIDED RIGHT SUPRACLAVICULAR LYMPH NODE BIOPSY 2. CT-GUIDED MESENTERIC NODAL CONGLOMERATION BIOPSY COMPARISON:  PET-CT-07/04/2021; CT abdomen and pelvis-06/21/2021 MEDICATIONS: None. ANESTHESIA/SEDATION: Moderate (conscious) sedation was employed during this procedure as administered by the Interventional Radiology RN. A total of Versed 2 mg and Fentanyl 100 mcg was administered intravenously. Moderate Sedation Time: 26 minutes. The patient's level of consciousness and vital signs were monitored continuously by radiology nursing throughout the procedure under my direct supervision. CONTRAST:  None. COMPLICATIONS: None immediate. PROCEDURE: Informed consent was obtained from the patient following an explanation of the procedure, risks, benefits and alternatives. A time out was performed prior to the initiation of the procedure. Sonographic evaluation was performed of the right supraclavicular fossa demonstrating several pathologically enlarged lymph nodes clustered within the medial aspect of the right supraclavicular fossa. The patient was then positioned supine on the CT gantry with preprocedural CT imaging demonstrating continued progression of mesenteric  root and retroperitoneal lymphadenopathy with index mesenteric root nodal conglomeration measuring at least 10.7 x 8.0 cm (image 33, series 2). In an effort to establish a tissue diagnosis in the most expeditious manner decision  was made to proceed with CT-guided biopsy of the mesenteric mass as well as ultrasound-guided biopsy of a dominant right supraclavicular lymph node. Attention was first paid towards CT-guided biopsy of the mesenteric mass. The procedure was planned. The operative site was prepped and draped in the usual sterile fashion. Appropriate trajectory was confirmed with a 22 gauge spinal needle after the adjacent tissues were anesthetized with 1% Lidocaine with epinephrine. Under intermittent CT guidance, a 17 gauge coaxial needle was advanced into the peripheral aspect of the mesenteric nodal conglomeration. Appropriate positioning was confirmed and 6 core needle biopsy samples were obtained with an 18 gauge core needle biopsy device. The co-axial needle was removed following the administration of a Gel-Foam slurry and superficial hemostasis was achieved with manual compression. Limited postprocedural CT was negative for hemorrhage or additional complication. A dressing was applied. _________________________________________________________ Attention was now paid towards ultrasound-guided biopsy of a dominant right supraclavicular lymph node. The operative site was prepped and draped in usual sterile fashion. After the overlying soft tissues were anesthetized with 1% lidocaine, 6 core needle biopsy samples were obtained with an 18 gauge core needle biopsy device under direct ultrasound guidance. Multiple ultrasound images were saved procedural documentation purposes. Postprocedural imaging was negative for hemorrhage or additional complication. A dressing was applied The patient tolerated the above procedures well without immediate postprocedural complication. The patient tolerated the procedure well  without immediate postprocedural complication. IMPRESSION: 1. Technically successful CT guided core needle biopsy of mesenteric nodal conglomeration. 2. Technically successful ultrasound-guided biopsy of dominant right supraclavicular lymph node. Electronically Signed   By: Sandi Mariscal M.D.   On: 07/19/2021 15:30    Labs:  CBC: Recent Labs    06/15/21 1518 06/26/21 1210 07/11/21 1422  WBC 6.5 12.2* 9.0  HGB 13.6 13.7 13.7  HCT 40.2 41.3 41.3  PLT 291.0 345.0 381    COAGS: Recent Labs    07/19/21 1020  INR 1.1    BMP: Recent Labs    06/15/21 1518 06/26/21 1210 07/11/21 1422  NA 138 136 136  K 4.0 4.5 4.3  CL 103 98 100  CO2 28 29 26   GLUCOSE 75 99 86  BUN 13 13 14   CALCIUM 9.6 9.6 9.6  CREATININE 0.97 1.62* 0.98  GFRNONAA  --   --  >60    LIVER FUNCTION TESTS: Recent Labs    06/15/21 1518 06/26/21 1210 07/11/21 1422  BILITOT 0.3 0.6 0.4  AST 12 11 16   ALT 10 8 15   ALKPHOS 48 55 58  PROT 7.2 7.5 7.7  ALBUMIN 4.4 4.3 4.3    TUMOR MARKERS: No results for input(s): AFPTM, CEA, CA199, CHROMGRNA in the last 8760 hours.  Assessment and Plan:  40 y/o M with recently diagnosed non-hodgkin's lymphoma who presents today for port placement prior to beginning systemic therapy.  Risks and benefits of image-guided Port-a-catheter placement were discussed with the patient including, but not limited to bleeding, infection, pneumothorax, or fibrin sheath development and need for additional procedures.  All of the patient's questions were answered, patient is agreeable to proceed.  Consent signed and in chart.  Thank you for this interesting consult.  I greatly enjoyed meeting Rashed Edler and look forward to participating in their care.  A copy of this report was sent to the requesting provider on this date.  Electronically Signed: Joaquim Nam, PA-C 07/25/2021, 3:37 PM   I spent a total of  15 Minutes in face to face in clinical consultation,  greater  than 50% of which was counseling/coordinating care for port placement.

## 2021-07-26 ENCOUNTER — Ambulatory Visit (HOSPITAL_COMMUNITY)
Admission: RE | Admit: 2021-07-26 | Discharge: 2021-07-26 | Disposition: A | Discharge status: Home / Self Care | Payer: No Typology Code available for payment source | Source: Ambulatory Visit | Attending: Hematology | Admitting: Hematology

## 2021-07-26 ENCOUNTER — Inpatient Hospital Stay: Payer: No Typology Code available for payment source

## 2021-07-26 ENCOUNTER — Encounter: Payer: Self-pay | Admitting: Hematology

## 2021-07-26 ENCOUNTER — Other Ambulatory Visit: Payer: Self-pay

## 2021-07-26 DIAGNOSIS — C8338 Diffuse large B-cell lymphoma, lymph nodes of multiple sites: Secondary | ICD-10-CM | POA: Insufficient documentation

## 2021-07-26 HISTORY — PX: IR IMAGING GUIDED PORT INSERTION: IMG5740

## 2021-07-26 LAB — CMP (CANCER CENTER ONLY)
ALT: 27 U/L (ref 0–44)
AST: 11 U/L — ABNORMAL LOW (ref 15–41)
Albumin: 3.8 g/dL (ref 3.5–5.0)
Alkaline Phosphatase: 57 U/L (ref 38–126)
Anion gap: 10 (ref 5–15)
BUN: 14 mg/dL (ref 6–20)
CO2: 23 mmol/L (ref 22–32)
Calcium: 9.2 mg/dL (ref 8.9–10.3)
Chloride: 105 mmol/L (ref 98–111)
Creatinine: 0.94 mg/dL (ref 0.61–1.24)
GFR, Estimated: >60 mL/min (ref >60)
Glucose, Bld: 225 mg/dL — ABNORMAL HIGH (ref 70–99)
Potassium: 4.4 mmol/L (ref 3.5–5.1)
Sodium: 138 mmol/L (ref 135–145)
Total Bilirubin: 0.4 mg/dL (ref 0.3–1.2)
Total Protein: 6.7 g/dL (ref 6.5–8.1)

## 2021-07-26 LAB — CBC WITH DIFFERENTIAL (CANCER CENTER ONLY)
Abs Immature Granulocytes: 0.02 10*3/uL (ref 0.00–0.07)
Basophils Absolute: 0 10*3/uL (ref 0.0–0.1)
Basophils Relative: 0 %
Eosinophils Absolute: 0 10*3/uL (ref 0.0–0.5)
Eosinophils Relative: 0 %
HCT: 39.9 % (ref 39.0–52.0)
Hemoglobin: 13.2 g/dL (ref 13.0–17.0)
Immature Granulocytes: 0 %
Lymphocytes Relative: 5 %
Lymphs Abs: 0.4 10*3/uL — ABNORMAL LOW (ref 0.7–4.0)
MCH: 28.8 pg (ref 26.0–34.0)
MCHC: 33.1 g/dL (ref 30.0–36.0)
MCV: 86.9 fL (ref 80.0–100.0)
Monocytes Absolute: 0.3 10*3/uL (ref 0.1–1.0)
Monocytes Relative: 4 %
Neutro Abs: 7 10*3/uL (ref 1.7–7.7)
Neutrophils Relative %: 91 %
Platelet Count: 397 10*3/uL (ref 150–400)
RBC: 4.59 MIL/uL (ref 4.22–5.81)
RDW: 13.4 % (ref 11.5–15.5)
WBC Count: 7.7 10*3/uL (ref 4.0–10.5)
nRBC: 0 % (ref 0.0–0.2)

## 2021-07-26 LAB — LACTATE DEHYDROGENASE: LDH: 264 U/L — ABNORMAL HIGH (ref 98–192)

## 2021-07-26 MED ORDER — HEPARIN SOD (PORK) LOCK FLUSH 100 UNIT/ML IV SOLN
INTRAVENOUS | Status: AC
  Filled 2021-07-26: qty 5

## 2021-07-26 MED ORDER — LIDOCAINE-EPINEPHRINE (PF) 2 %-1:200000 IJ SOLN
INTRAMUSCULAR | Status: AC | PRN
  Administered 2021-07-26: 10 mL via INTRADERMAL

## 2021-07-26 MED ORDER — SODIUM CHLORIDE 0.9 % IV SOLN: INTRAVENOUS | Status: DC

## 2021-07-26 MED ORDER — LIDOCAINE-EPINEPHRINE (PF) 2 %-1:200000 IJ SOLN
INTRAMUSCULAR | Status: AC
  Filled 2021-07-26: qty 20

## 2021-07-26 MED ORDER — MIDAZOLAM HCL 2 MG/2ML IJ SOLN
INTRAMUSCULAR | Status: AC | PRN
  Administered 2021-07-26: .5 mg via INTRAVENOUS

## 2021-07-26 MED ORDER — FENTANYL CITRATE (PF) 100 MCG/2ML IJ SOLN
INTRAMUSCULAR | Status: AC
  Filled 2021-07-26: qty 2

## 2021-07-26 MED ORDER — MIDAZOLAM HCL 2 MG/2ML IJ SOLN
INTRAMUSCULAR | Status: AC
  Filled 2021-07-26: qty 4

## 2021-07-26 MED ORDER — HEPARIN SOD (PORK) LOCK FLUSH 100 UNIT/ML IV SOLN
INTRAVENOUS | Status: AC | PRN
  Administered 2021-07-26: 500 [IU] via INTRAVENOUS

## 2021-07-26 MED ORDER — FENTANYL CITRATE (PF) 100 MCG/2ML IJ SOLN
INTRAMUSCULAR | Status: AC | PRN
  Administered 2021-07-26: 50 ug via INTRAVENOUS

## 2021-07-26 MED ORDER — MIDAZOLAM HCL 2 MG/2ML IJ SOLN
INTRAMUSCULAR | Status: AC | PRN
  Administered 2021-07-26: 2 mg via INTRAVENOUS

## 2021-07-26 MED ORDER — LIDOCAINE HCL 1 % IJ SOLN
INTRAMUSCULAR | Status: AC
  Filled 2021-07-26: qty 20

## 2021-07-26 NOTE — Progress Notes (Signed)
Called pt to introduce myself as his Financial Resource Specialist, discuss copay assistance and the [employer] grant.  I left a msg requesting he return my call if he's interested in applying for the grants. 

## 2021-07-26 NOTE — Procedures (Signed)
Pre Procedure Dx: Poor venous access Post Procedural Dx: Same  Successful placement of a left IJ approach port-a-cath with tip at the superior caval atrial junction. The catheter is ready for immediate use.  Estimated Blood Loss: Minimal  Complications: None immediate.  Ronny Bacon, MD Pager #: [phone]

## 2021-07-27 ENCOUNTER — Encounter: Payer: Self-pay | Admitting: Hematology

## 2021-07-27 ENCOUNTER — Other Ambulatory Visit: Payer: Self-pay | Admitting: Hematology

## 2021-07-27 ENCOUNTER — Ambulatory Visit (HOSPITAL_BASED_OUTPATIENT_CLINIC_OR_DEPARTMENT_OTHER): Payer: No Typology Code available for payment source | Admitting: Physician Assistant

## 2021-07-27 ENCOUNTER — Inpatient Hospital Stay: Payer: No Typology Code available for payment source

## 2021-07-27 VITALS — BP 114/65 | HR 91 | Temp 97.8°F | Resp 16

## 2021-07-27 DIAGNOSIS — Z5112 Encounter for antineoplastic immunotherapy: Secondary | ICD-10-CM | POA: Diagnosis not present

## 2021-07-27 DIAGNOSIS — R110 Nausea: Secondary | ICD-10-CM

## 2021-07-27 DIAGNOSIS — C8338 Diffuse large B-cell lymphoma, lymph nodes of multiple sites: Secondary | ICD-10-CM

## 2021-07-27 DIAGNOSIS — C8598 Non-Hodgkin lymphoma, unspecified, lymph nodes of multiple sites: Secondary | ICD-10-CM | POA: Diagnosis not present

## 2021-07-27 MED ORDER — LORAZEPAM 2 MG/ML IJ SOLN: 0.5000 mg | Freq: Once | INTRAMUSCULAR | Status: AC

## 2021-07-27 MED ORDER — SODIUM CHLORIDE 0.9 % IV SOLN: Freq: Once | INTRAVENOUS | Status: AC

## 2021-07-27 MED ORDER — VINCRISTINE SULFATE CHEMO INJECTION 1 MG/ML
2.0000 mg | Freq: Once | INTRAVENOUS | Status: AC
  Administered 2021-07-27: 2 mg via INTRAVENOUS
  Filled 2021-07-27: qty 2

## 2021-07-27 MED ORDER — DIPHENHYDRAMINE HCL 25 MG PO CAPS
50.0000 mg | ORAL_CAPSULE | Freq: Once | ORAL | Status: AC
  Administered 2021-07-27: 50 mg via ORAL
  Filled 2021-07-27: qty 2

## 2021-07-27 MED ORDER — FAMOTIDINE 20 MG IN NS 100 ML IVPB
20.0000 mg | Freq: Once | INTRAVENOUS | Status: AC
  Administered 2021-07-27: 20 mg via INTRAVENOUS
  Filled 2021-07-27: qty 100

## 2021-07-27 MED ORDER — PALONOSETRON HCL INJECTION 0.25 MG/5ML
0.2500 mg | Freq: Once | INTRAVENOUS | Status: AC
  Administered 2021-07-27: 0.25 mg via INTRAVENOUS
  Filled 2021-07-27: qty 5

## 2021-07-27 MED ORDER — MAGIC MOUTHWASH W/LIDOCAINE: 5.0000 mL | Freq: Four times a day (QID) | ORAL | 1 refills | Status: DC | PRN

## 2021-07-27 MED ORDER — HEPARIN SOD (PORK) LOCK FLUSH 100 UNIT/ML IV SOLN
500.0000 [IU] | Freq: Once | INTRAVENOUS | Status: AC | PRN
  Administered 2021-07-27: 500 [IU]

## 2021-07-27 MED ORDER — SODIUM CHLORIDE 0.9 % IV SOLN
375.0000 mg/m2 | Freq: Once | INTRAVENOUS | Status: AC
  Administered 2021-07-27: 800 mg via INTRAVENOUS
  Filled 2021-07-27: qty 50

## 2021-07-27 MED ORDER — SODIUM CHLORIDE 0.9 % IV SOLN
150.0000 mg | Freq: Once | INTRAVENOUS | Status: AC
  Administered 2021-07-27: 150 mg via INTRAVENOUS
  Filled 2021-07-27: qty 150

## 2021-07-27 MED ORDER — LORAZEPAM 2 MG/ML IJ SOLN
INTRAMUSCULAR | Status: AC
  Administered 2021-07-27: 0.5 mg
  Filled 2021-07-27: qty 1

## 2021-07-27 MED ORDER — ACETAMINOPHEN 325 MG PO TABS
650.0000 mg | ORAL_TABLET | Freq: Once | ORAL | Status: AC
  Administered 2021-07-27: 650 mg via ORAL
  Filled 2021-07-27: qty 2

## 2021-07-27 MED ORDER — SODIUM CHLORIDE 0.9 % IV SOLN
10.0000 mg | Freq: Once | INTRAVENOUS | Status: AC
  Administered 2021-07-27: 10 mg via INTRAVENOUS
  Filled 2021-07-27: qty 10

## 2021-07-27 MED ORDER — DOXORUBICIN HCL CHEMO IV INJECTION 2 MG/ML
50.0000 mg/m2 | Freq: Once | INTRAVENOUS | Status: AC
  Administered 2021-07-27: 106 mg via INTRAVENOUS
  Filled 2021-07-27: qty 53

## 2021-07-27 MED ORDER — SODIUM CHLORIDE 0.9% FLUSH
10.0000 mL | INTRAVENOUS | Status: DC | PRN
  Administered 2021-07-27: 10 mL

## 2021-07-27 MED ORDER — SODIUM CHLORIDE 0.9 % IV SOLN
750.0000 mg/m2 | Freq: Once | INTRAVENOUS | Status: AC
  Administered 2021-07-27: 1600 mg via INTRAVENOUS
  Filled 2021-07-27: qty 80

## 2021-07-27 NOTE — Patient Instructions (Signed)
[facility]  Discharge Instructions: Thank you for choosing [facility] to provide your oncology and hematology care.   If you have a lab appointment with the [facility], please go directly to the [facility] and check in at the registration area.   Wear comfortable clothing and clothing appropriate for easy access to any Portacath or PICC line.   We strive to give you quality time with your provider. You may need to reschedule your appointment if you arrive late (15 or more minutes).  Arriving late affects you and other patients whose appointments are after yours.  Also, if you miss three or more appointments without notifying the office, you may be dismissed from the clinic at the providers discretion.      For prescription refill requests, have your pharmacy contact our office and allow 72 hours for refills to be completed.    Today you received the following chemotherapy and/or immunotherapy agents rituxan, adriamycin, cyclophosphamide, doxorubicin, vincristine      To help prevent nausea and vomiting after your treatment, we encourage you to take your nausea medication as directed.  BELOW ARE SYMPTOMS THAT SHOULD BE REPORTED IMMEDIATELY: *FEVER GREATER THAN 100.4 F (38 C) OR HIGHER *CHILLS OR SWEATING *NAUSEA AND VOMITING THAT IS NOT CONTROLLED WITH YOUR NAUSEA MEDICATION *UNUSUAL SHORTNESS OF BREATH *UNUSUAL BRUISING OR BLEEDING *URINARY PROBLEMS (pain or burning when urinating, or frequent urination) *BOWEL PROBLEMS (unusual diarrhea, constipation, pain near the anus) TENDERNESS IN MOUTH AND THROAT WITH OR WITHOUT PRESENCE OF ULCERS (sore throat, sores in mouth, or a toothache) UNUSUAL RASH, SWELLING OR PAIN  UNUSUAL VAGINAL DISCHARGE OR ITCHING   Items with * indicate a potential emergency and should be followed up as soon as possible or go to the Emergency Department if any problems should occur.  Please show the CHEMOTHERAPY  ALERT CARD or IMMUNOTHERAPY ALERT CARD at check-in to the Emergency Department and triage nurse.  Should you have questions after your visit or need to cancel or reschedule your appointment, please contact [facility]  Dept: [phone]  and follow the prompts.  Office hours are 8:00 a.m. to 4:30 p.m. Monday - Friday. Please note that voicemails left after 4:00 p.m. may not be returned until the following business day.  We are closed weekends and major holidays. You have access to a nurse at all times for urgent questions. Please call the main number to the clinic Dept: [phone] and follow the prompts.   For any non-urgent questions, you may also contact your provider using MyChart. We now offer e-Visits for anyone 19 and older to request care online for non-urgent symptoms. For details visit [email or web address].   Also download the MyChart app! Go to the app store, search "MyChart", open the app, select [facility], and log in with your MyChart username and password.  Due to Covid, a mask is required upon entering the hospital/clinic. If you do not have a mask, one will be given to you upon arrival. For doctor visits, patients may have 1 support person aged 69 or older with them. For treatment visits, patients cannot have anyone with them due to current Covid guidelines and our immunocompromised population.

## 2021-07-27 NOTE — Progress Notes (Signed)
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  Patient states that started today during his treatment.  He ate breakfast this morning and was otherwise feeling well prior to coming in today.  He was given premedications to include Pepcid, Tylenol, Benadryl, Emend, dexamethasone and palonosetron.  He states the nausea started shortly after beginning treatment.  He denies it being "that bad," describing it as a constant dull nauseous feeling.  He states his abdominal pain improved ever since he started taking the prednisone.  He denies any sick contacts, fever, chills, cough, emesis, back pain, urinary symptoms, diarrhea, rash, leg swelling.    ROS  All other systems are reviewed and are negative for acute change except as noted in the HPI.    Allergies  Allergen Reactions   Bee Pollen-1000-Royal Jelly [Nutritional Supplements] Anaphylaxis    And Bee's Wax - inflammatory reaction per pt    Gluten Meal Other (See Comments)    Abdominal cramping    Lactose Intolerance (Gi) Other (See Comments)    Abdominal cramping     Past Medical History:  Diagnosis Date   Allergy    Cancer ([place])     skin cancer   Herniated disc    Hx of skin cancer, basal cell    Hyperlipidemia    borderline- no meds      Past Surgical History:  Procedure Laterality Date   COLONOSCOPY     last 2013   ESOPHAGOGASTRODUODENOSCOPY (EGD) WITH PROPOFOL N/A 06/29/2021   Procedure: ESOPHAGOGASTRODUODENOSCOPY (EGD) WITH PROPOFOL;  Surgeon: Milus Banister, MD;  Location: [facility];  Service: Endoscopy;  Laterality: N/A;   EUS N/A 06/29/2021   Procedure: UPPER ENDOSCOPIC ULTRASOUND (EUS) RADIAL;  Surgeon: Milus Banister, MD;  Location: [facility];  Service: Endoscopy;  Laterality: N/A;   FINE NEEDLE ASPIRATION N/A 06/29/2021   Procedure: FINE NEEDLE ASPIRATION (FNA) LINEAR;  Surgeon: Milus Banister, MD;  Location: [facility];  Service: Endoscopy;  Laterality: N/A;   IR IMAGING GUIDED PORT INSERTION  07/26/2021   VASECTOMY      Social History   Socioeconomic History   Marital status: Married    Spouse name: Not on file   Number of children: 1   Years of education: Not on file   Highest education level: Not on file  Occupational History   Occupation: Sales   Tobacco Use   Smoking status: Never   Smokeless tobacco: Never  Substance and Sexual Activity   Alcohol use: Not Currently    Alcohol/week: 1.0 standard drink    Types: 1 Glasses of wine per week    Comment: Rare    Drug use: No   Sexual  activity: Not on file  Other Topics Concern   Not on file  Social History Narrative   Daily caffeine    Social Determinants of Health   Financial Resource Strain: Not on file  Food Insecurity: Not on file  Transportation Needs: Not on file  Physical Activity: Not on file  Stress: Not on file  Social Connections: Not on file  Intimate Partner Violence: Not on file    Family History  Problem Relation Age of Onset   Colon cancer Mother 45   Colon polyps Mother    Colon cancer Paternal Grandfather    Alzheimer's disease Paternal Grandfather    Breast cancer Maternal Aunt     Diabetes Maternal Grandmother    Prostate cancer Maternal Grandfather    Esophageal cancer Neg Hx    Rectal cancer Neg Hx    Stomach cancer Neg Hx      Current Outpatient Medications:    allopurinol (ZYLOPRIM) 100 MG tablet, Take 1 tablet (100 mg total) by mouth 2 (two) times daily., Disp: 60 tablet, Rfl: 0   Ascorbic Acid (VITA-C PO), Take 1 tablet by mouth 2 (two) times daily., Disp: , Rfl:    Cholecalciferol (VITAMIN D-3) 125 MCG (5000 UT) TABS, Take 5,000 Units by mouth 2 (two) times daily., Disp: , Rfl:    HYDROcodone-acetaminophen (NORCO) 5-325 MG tablet, Take 1-2 tablets by mouth every 6 (six) hours as needed for moderate pain., Disp: 50 tablet, Rfl: 0   ibuprofen (ADVIL) 200 MG tablet, Take 400-600 mg by mouth every 6 (six) hours as needed for mild pain or moderate pain., Disp: , Rfl:    lidocaine-prilocaine (EMLA) cream, Apply to affected area once, Disp: 30 g, Rfl: 3   LORazepam (ATIVAN) 0.5 MG tablet, Take 1 tablet (0.5 mg total) by mouth every 6 (six) hours as needed (Nausea or vomiting)., Disp: 30 tablet, Rfl: 0   magic mouthwash w/lidocaine SOLN, Take 5 mLs by mouth 4 (four) times daily as needed for mouth pain. Rx: 50 mL viscous lidocaine 2% 50 mL Maalox 50 mL diphenhydramine (Benadryl) at 12.5 mg per 5 ml elixir 50 mL nystatin (100,000U per 5 mL) suspension 50 mL distilled water, Disp: 250 mL, Rfl: 1   ondansetron (ZOFRAN) 8 MG tablet, Take 1 tablet (8 mg total) by mouth 2 (two) times daily as needed for refractory nausea / vomiting. Start on day 3 after cyclophosphamide chemotherapy., Disp: 30 tablet, Rfl: 1   pantoprazole (PROTONIX) 40 MG tablet, Take 1 tablet (40 mg total) by mouth daily before breakfast., Disp: 30 tablet, Rfl: 2   predniSONE (DELTASONE) 20 MG tablet, Take 3 tablets (60 mg total) by mouth daily with breakfast for 7 days., Disp: 21 tablet, Rfl: 0   predniSONE (DELTASONE) 20 MG tablet, Take 3 tablets (60 mg total) by mouth daily. Take with food on days 2-6 of  chemotherapy., Disp: 25 tablet, Rfl: 5   prochlorperazine (COMPAZINE) 10 MG tablet, Take 1 tablet (10 mg total) by mouth every 6 (six) hours as needed (Nausea or vomiting)., Disp: 30 tablet, Rfl: 6 No current facility-administered medications for this visit.  Facility-Administered Medications Ordered in Other Visits:    heparin lock flush 100 unit/mL, 500 Units, Intracatheter, Once PRN, Irene Limbo, Cloria Spring, MD   sodium chloride flush (NS) 0.9 % injection 10 mL, 10 mL, Intracatheter, PRN, Irene Limbo, Cloria Spring, MD  PHYSICAL EXAM: ECOG FS:1 - Symptomatic but completely ambulatory  BP: 106/66, HR: 70, RR:18, O2:100%, T: 98.0.  Physical Exam Vitals and  nursing note reviewed.  Constitutional:      Appearance: He is well-developed. He is not ill-appearing or toxic-appearing.  HENT:     Head: Normocephalic and atraumatic.     Right Ear: External ear normal.     Left Ear: External ear normal.     Nose: Nose normal.  Eyes:     General: No scleral icterus.       Right eye: No discharge.        Left eye: No discharge.     Conjunctiva/sclera: Conjunctivae normal.  Neck:     Vascular: No JVD.  Cardiovascular:     Rate and Rhythm: Normal rate and regular rhythm.     Pulses: Normal pulses.     Heart sounds: Normal heart sounds.  Pulmonary:     Effort: Pulmonary effort is normal.     Breath sounds: Normal breath sounds.  Chest:     Comments: Port in left upper chest without signs of infection Abdominal:     General: There is no distension.     Palpations: Abdomen is soft. There is no mass.     Tenderness: There is no abdominal tenderness. There is no guarding or rebound.     Hernia: No hernia is present.  Musculoskeletal:        General: Normal range of motion.     Cervical back: Normal range of motion.  Skin:    General: Skin is warm and dry.  Neurological:     Mental Status: He is oriented to person, place, and time.     GCS: GCS eye subscore is 4. GCS verbal subscore is 5. GCS  motor subscore is 6.     Comments: Fluent speech, no facial droop.  Psychiatric:        Behavior: Behavior normal.       LABORATORY DATA: I have reviewed the data as listed CBC Latest Ref Rng & Units 07/26/2021 07/11/2021 06/26/2021  WBC 4.0 - 10.5 K/uL 7.7 9.0 12.2(H)  Hemoglobin 13.0 - 17.0 g/dL 13.2 13.7 13.7  Hematocrit 39.0 - 52.0 % 39.9 41.3 41.3  Platelets 150 - 400 K/uL 397 381 345.0     CMP Latest Ref Rng & Units 07/26/2021 07/11/2021 06/26/2021  Glucose 70 - 99 mg/dL 225(H) 86 99  BUN 6 - 20 mg/dL _0 Creatinine 0.61 - 1.24 mg/dL 0.94 0.98 1.62(H)  Sodium 135 - 145 mmol/L 138 136 136  Potassium 3.5 - 5.1 mmol/L 4.4 4.3 4.5  Chloride 98 - 111 mmol/L 105 100 98  CO2 22 - 32 mmol/L _1 Calcium 8.9 - 10.3 mg/dL 9.2 9.6 9.6  Total Protein 6.5 - 8.1 g/dL 6.7 7.7 7.5  Total Bilirubin 0.3 - 1.2 mg/dL 0.4 0.4 0.6  Alkaline Phos 38 - 126 U/L 57 58 55  AST 15 - 41 U/L 11(L) 16 11  ALT 0 - 44 U/L _2 RADIOGRAPHIC STUDIES: I have personally reviewed the radiological images as listed and agreed with the findings in the report. No images are attached to the encounter. NM PET Image Initial (PI) Skull Base To Thigh (F-18 FDG)  Result Date: 07/04/2021 CLINICAL DATA:  Initial treatment strategy for non-Hodgkin's lymphoma. EXAM: NUCLEAR MEDICINE PET SKULL BASE TO THIGH TECHNIQUE: 9.6 mCi F-18 FDG was injected intravenously. Full-ring PET imaging was performed from the skull base to thigh after the radiotracer. CT data was obtained and used for attenuation correction and anatomic localization.  Fasting blood glucose: 99 mg/dl COMPARISON:  CT abdomen pelvis 06/21/2021 FINDINGS: Mediastinal blood pool activity: SUV max 2.2 Liver activity: SUV max 3.5 NECK: Cluster of hypermetabolic RIGHT supraclavicular node with SUV max equal 6.3. Nodes measure 1 cm each. Smaller RIGHT level 3 lymph node on image 46 with SUV max equal 4.1. Incidental CT findings: none CHEST:  Intensely hypermetabolic mediastinal adenopathy. Cluster of high RIGHT paratracheal nodes measuring 10 mm short axis with SUV max equal 7.3. Cluster of hypermetabolic 1 cm subcarinal nodes with SUV max equal 7.6. Sub solid nodule in the RIGHT upper lobe measuring 7 mm (image 73/4) has associated metabolic activity with SUV max equal 2.5. Similar mild activity associated with sub solid nodule measuring 13 mm image 73/4. Incidental CT findings: none ABDOMEN/PELVIS: Bulky mat of nodal tissue within the abdominal mesentery with SUV max equal 11.1. Individual nodes measure up to 2 cm. Hypermetabolic retroperitoneal adenopathy in the upper abdomen about the celiac trunk. Segment of thickened small bowel measuring 4.4 cm in length (image 180/4) with intense metabolic activity (SUV max equal 16.4 on image 182). No iliac or inguinal hypermetabolic lymph nodes. Incidental CT findings: none SKELETON: No focal hypermetabolic activity to suggest skeletal metastasis. Incidental CT findings: none IMPRESSION: 1. Hypermetabolic RIGHT supraclavicular and mediastinal adenopathy consistent with thoracic lymphoma. 2. Metabolic activity associated with two sub solid nodules in the RIGHT upper lobe is concerning for lymphoma involvement of the lung parenchyma. 3. Bulky hypermetabolic central mesenteric adenopathy and retroperitoneal adenopathy in the upper abdomen consistent with nodal lymphoma. 4. A 4.4 cm segment of hypermetabolic small bowel wall thickening consistent lymphoma involvement of the small-bowel. 5. No inguinal or iliac hypermetabolic nodes. 6. No spleen involvement or bone marrow involvement. Electronically Signed   By: Suzy Bouchard M.D.   On: 07/04/2021 16:32   CT BIOPSY  Result Date: 07/19/2021 INDICATION: Recent diagnosis non-Hodgkin's lymphoma, post endoscopic biopsy though with insignificant tissue sampling to allow for definitive characterization. Please perform CT-guided mesenteric mass biopsy and or  ultrasound-guided right subclavicular navicular biopsy for tissue diagnostic purposes. EXAM: 1. ULTRASOUND-GUIDED RIGHT SUPRACLAVICULAR LYMPH NODE BIOPSY 2. CT-GUIDED MESENTERIC NODAL CONGLOMERATION BIOPSY COMPARISON:  PET-CT-07/04/2021; CT abdomen and pelvis-06/21/2021 MEDICATIONS: None. ANESTHESIA/SEDATION: Moderate (conscious) sedation was employed during this procedure as administered by the Interventional Radiology RN. A total of Versed 2 mg and Fentanyl 100 mcg was administered intravenously. Moderate Sedation Time: 26 minutes. The patient's level of consciousness and vital signs were monitored continuously by radiology nursing throughout the procedure under my direct supervision. CONTRAST:  None. COMPLICATIONS: None immediate. PROCEDURE: Informed consent was obtained from the patient following an explanation of the procedure, risks, benefits and alternatives. A time out was performed prior to the initiation of the procedure. Sonographic evaluation was performed of the right supraclavicular fossa demonstrating several pathologically enlarged lymph nodes clustered within the medial aspect of the right supraclavicular fossa. The patient was then positioned supine on the CT gantry with preprocedural CT imaging demonstrating continued progression of mesenteric root and retroperitoneal lymphadenopathy with index mesenteric root nodal conglomeration measuring at least 10.7 x 8.0 cm (image 33, series 2). In an effort to establish a tissue diagnosis in the most expeditious manner decision was made to proceed with CT-guided biopsy of the mesenteric mass as well as ultrasound-guided biopsy of a dominant right supraclavicular lymph node. Attention was first paid towards CT-guided biopsy of the mesenteric mass. The procedure was planned. The operative site was prepped and draped in the usual sterile fashion. Appropriate trajectory was  confirmed with a 22 gauge spinal needle after the adjacent tissues were anesthetized  with 1% Lidocaine with epinephrine. Under intermittent CT guidance, a 17 gauge coaxial needle was advanced into the peripheral aspect of the mesenteric nodal conglomeration. Appropriate positioning was confirmed and 6 core needle biopsy samples were obtained with an 18 gauge core needle biopsy device. The co-axial needle was removed following the administration of a Gel-Foam slurry and superficial hemostasis was achieved with manual compression. Limited postprocedural CT was negative for hemorrhage or additional complication. A dressing was applied. _________________________________________________________ Attention was now paid towards ultrasound-guided biopsy of a dominant right supraclavicular lymph node. The operative site was prepped and draped in usual sterile fashion. After the overlying soft tissues were anesthetized with 1% lidocaine, 6 core needle biopsy samples were obtained with an 18 gauge core needle biopsy device under direct ultrasound guidance. Multiple ultrasound images were saved procedural documentation purposes. Postprocedural imaging was negative for hemorrhage or additional complication. A dressing was applied The patient tolerated the above procedures well without immediate postprocedural complication. The patient tolerated the procedure well without immediate postprocedural complication. IMPRESSION: 1. Technically successful CT guided core needle biopsy of mesenteric nodal conglomeration. 2. Technically successful ultrasound-guided biopsy of dominant right supraclavicular lymph node. Electronically Signed   By: Sandi Mariscal M.D.   On: 07/19/2021 15:30   ECHOCARDIOGRAM COMPLETE  Result Date: 07/25/2021    ECHOCARDIOGRAM REPORT   Patient Name:   CONLEE SLITER Date of Exam: 07/25/2021 Medical Rec #:  [id number]          Height:       74.0 in Accession #:    [id number]         Weight:       188.9 lb Date of Birth:  09-14-1980          BSA:          2.121 m Patient Age:    36 years            BP:           127/87 mmHg Patient Gender: M                  HR:           64 bpm. Exam Location:  Outpatient Procedure: 2D Echo, Cardiac Doppler, Color Doppler and Strain Analysis Indications:    Pre-Chemo  History:        Patient has no prior history of Echocardiogram examinations.                 Lymphoma.  Sonographer:    Merrie Roof RDCS Referring Phys: [id number] [place]  1. Left ventricular ejection fraction, by estimation, is 60 to 65%. The left ventricle has normal function. The left ventricle has no regional wall motion abnormalities. Left ventricular diastolic parameters were normal. The average left ventricular global longitudinal strain is -21.4 %. The global longitudinal strain is normal.  2. Right ventricular systolic function is normal. The right ventricular size is normal.  3. ? Port/catheter seen in RA.  4. The mitral valve is normal in structure. No evidence of mitral valve regurgitation. No evidence of mitral stenosis.  5. The aortic valve is tricuspid. Aortic valve regurgitation is not visualized. No aortic stenosis is present.  6. The inferior vena cava is normal in size with greater than 50% respiratory variability, suggesting right atrial pressure of 3 mmHg. FINDINGS  Left Ventricle: Left ventricular ejection fraction, by estimation, is 60  to 65%. The left ventricle has normal function. The left ventricle has no regional wall motion abnormalities. The average left ventricular global longitudinal strain is -21.4 %. The global longitudinal strain is normal. The left ventricular internal cavity size was normal in size. There is no left ventricular hypertrophy. Left ventricular diastolic parameters were normal. Right Ventricle: The right ventricular size is normal. No increase in right ventricular wall thickness. Right ventricular systolic function is normal. Left Atrium: Left atrial size was normal in size. Right Atrium: ? Port/catheter seen in RA. Right atrial size was  normal in size. Pericardium: There is no evidence of pericardial effusion. Mitral Valve: The mitral valve is normal in structure. No evidence of mitral valve regurgitation. No evidence of mitral valve stenosis. Tricuspid Valve: The tricuspid valve is normal in structure. Tricuspid valve regurgitation is not demonstrated. No evidence of tricuspid stenosis. Aortic Valve: The aortic valve is tricuspid. Aortic valve regurgitation is not visualized. No aortic stenosis is present. Aortic valve mean gradient measures 4.0 mmHg. Aortic valve peak gradient measures 6.9 mmHg. Aortic valve area, by VTI measures 3.30 cm. Pulmonic Valve: The pulmonic valve was normal in structure. Pulmonic valve regurgitation is not visualized. No evidence of pulmonic stenosis. Aorta: The aortic root is normal in size and structure. Venous: The inferior vena cava is normal in size with greater than 50% respiratory variability, suggesting right atrial pressure of 3 mmHg. IAS/Shunts: No atrial level shunt detected by color flow Doppler.  LEFT VENTRICLE PLAX 2D LVIDd:         5.40 cm   Diastology LVIDs:         3.50 cm   LV e' medial:    13.70 cm/s LV PW:         0.90 cm   LV E/e' medial:  6.8 LV IVS:        0.70 cm   LV e' lateral:   17.40 cm/s LVOT diam:     2.20 cm   LV E/e' lateral: 5.4 LV SV:         82 LV SV Index:   39        2D Longitudinal Strain LVOT Area:     3.80 cm  2D Strain GLS Avg:     -21.4 %                           3D Volume EF:                          3D EF:        61 %                          LV EDV:       144 ml                          LV ESV:       56 ml                          LV SV:        88 ml RIGHT VENTRICLE RV Basal diam:  4.10 cm RV Mid diam:    3.40 cm LEFT ATRIUM             Index        RIGHT  ATRIUM           Index LA diam:        3.60 cm 1.70 cm/m   RA Area:     26.00 cm LA Vol (A2C):   58.4 ml 27.53 ml/m  RA Volume:   97.20 ml  45.82 ml/m LA Vol (A4C):   55.1 ml 25.97 ml/m LA Biplane Vol: 57.6 ml 27.15  ml/m  AORTIC VALVE AV Area (Vmax):    3.40 cm AV Area (Vmean):   3.17 cm AV Area (VTI):     3.30 cm AV Vmax:           131.00 cm/s AV Vmean:          87.300 cm/s AV VTI:            0.250 m AV Peak Grad:      6.9 mmHg AV Mean Grad:      4.0 mmHg LVOT Vmax:         117.00 cm/s LVOT Vmean:        72.700 cm/s LVOT VTI:          0.217 m LVOT/AV VTI ratio: 0.87  AORTA Ao Root diam: 3.30 cm Ao Asc diam:  3.00 cm MITRAL VALVE MV Area (PHT): 4.80 cm    SHUNTS MV Decel Time: 158 msec    Systemic VTI:  0.22 m MV E velocity: 93.70 cm/s  Systemic Diam: 2.20 cm MV A velocity: 53.70 cm/s MV E/A ratio:  1.74 Jenkins Rouge MD Electronically signed by Jenkins Rouge MD Signature Date/Time: 07/25/2021/1:26:41 PM    Final    [place] CORE BIOPSY (LYMPH NODES)  Result Date: 07/19/2021 INDICATION: Recent diagnosis non-Hodgkin's lymphoma, post endoscopic biopsy though with insignificant tissue sampling to allow for definitive characterization. Please perform CT-guided mesenteric mass biopsy and or ultrasound-guided right subclavicular navicular biopsy for tissue diagnostic purposes. EXAM: 1. ULTRASOUND-GUIDED RIGHT SUPRACLAVICULAR LYMPH NODE BIOPSY 2. CT-GUIDED MESENTERIC NODAL CONGLOMERATION BIOPSY COMPARISON:  PET-CT-07/04/2021; CT abdomen and pelvis-06/21/2021 MEDICATIONS: None. ANESTHESIA/SEDATION: Moderate (conscious) sedation was employed during this procedure as administered by the Interventional Radiology RN. A total of Versed 2 mg and Fentanyl 100 mcg was administered intravenously. Moderate Sedation Time: 26 minutes. The patient's level of consciousness and vital signs were monitored continuously by radiology nursing throughout the procedure under my direct supervision. CONTRAST:  None. COMPLICATIONS: None immediate. PROCEDURE: Informed consent was obtained from the patient following an explanation of the procedure, risks, benefits and alternatives. A time out was performed prior to the initiation of the procedure. Sonographic  evaluation was performed of the right supraclavicular fossa demonstrating several pathologically enlarged lymph nodes clustered within the medial aspect of the right supraclavicular fossa. The patient was then positioned supine on the CT gantry with preprocedural CT imaging demonstrating continued progression of mesenteric root and retroperitoneal lymphadenopathy with index mesenteric root nodal conglomeration measuring at least 10.7 x 8.0 cm (image 33, series 2). In an effort to establish a tissue diagnosis in the most expeditious manner decision was made to proceed with CT-guided biopsy of the mesenteric mass as well as ultrasound-guided biopsy of a dominant right supraclavicular lymph node. Attention was first paid towards CT-guided biopsy of the mesenteric mass. The procedure was planned. The operative site was prepped and draped in the usual sterile fashion. Appropriate trajectory was confirmed with a 22 gauge spinal needle after the adjacent tissues were anesthetized with 1% Lidocaine with epinephrine. Under intermittent CT guidance, a 17 gauge coaxial needle was advanced into the peripheral  aspect of the mesenteric nodal conglomeration. Appropriate positioning was confirmed and 6 core needle biopsy samples were obtained with an 18 gauge core needle biopsy device. The co-axial needle was removed following the administration of a Gel-Foam slurry and superficial hemostasis was achieved with manual compression. Limited postprocedural CT was negative for hemorrhage or additional complication. A dressing was applied. _________________________________________________________ Attention was now paid towards ultrasound-guided biopsy of a dominant right supraclavicular lymph node. The operative site was prepped and draped in usual sterile fashion. After the overlying soft tissues were anesthetized with 1% lidocaine, 6 core needle biopsy samples were obtained with an 18 gauge core needle biopsy device under direct  ultrasound guidance. Multiple ultrasound images were saved procedural documentation purposes. Postprocedural imaging was negative for hemorrhage or additional complication. A dressing was applied The patient tolerated the above procedures well without immediate postprocedural complication. The patient tolerated the procedure well without immediate postprocedural complication. IMPRESSION: 1. Technically successful CT guided core needle biopsy of mesenteric nodal conglomeration. 2. Technically successful ultrasound-guided biopsy of dominant right supraclavicular lymph node. Electronically Signed   By: Sandi Mariscal M.D.   On: 07/19/2021 15:30   IR IMAGING GUIDED PORT INSERTION  Result Date: 07/26/2021 INDICATION: History of lymphoma. In need of durable intravenous access for administration. EXAM: IMPLANTED PORT A CATH PLACEMENT WITH ULTRASOUND AND FLUOROSCOPIC GUIDANCE COMPARISON:  PET-CT-07/04/2021 MEDICATIONS: None ANESTHESIA/SEDATION: Moderate (conscious) sedation was employed during this procedure. A total of Versed 4 mg and Fentanyl 100 mcg was administered intravenously. Moderate Sedation Time: 28 minutes. The patient's level of consciousness and vital signs were monitored continuously by radiology nursing throughout the procedure under my direct supervision. CONTRAST:  None FLUOROSCOPY TIME:  31 seconds (1 mGy) COMPLICATIONS: None immediate. PROCEDURE: The procedure, risks, benefits, and alternatives were explained to the patient. Questions regarding the procedure were encouraged and answered. The patient understands and consents to the procedure. Given the presence the known hypermetabolic right cervical lymphadenopathy, the decision was decision made to proceed with left internal jugular approach port a placement. The left neck and chest were prepped with chlorhexidine in a sterile fashion, and a sterile drape was applied covering the operative field. Maximum barrier sterile technique with sterile gowns  and gloves were used for the procedure. A timeout was performed prior to the initiation of the procedure. Local anesthesia was provided with 1% lidocaine with epinephrine. After creating a small venotomy incision, a micropuncture kit was utilized to access the internal jugular vein. Real-time ultrasound guidance was utilized for vascular access including the acquisition of a permanent ultrasound image documenting patency of the accessed vessel. The microwire was utilized to measure appropriate catheter length. A subcutaneous port pocket was then created along the upper chest wall utilizing a combination of sharp and blunt dissection. The pocket was irrigated with sterile saline. A single lumen slim sized power injectable port was chosen for placement. The 8 Fr catheter was tunneled from the port pocket site to the venotomy incision. The port was placed in the pocket. The external catheter was trimmed to appropriate length. At the venotomy, an 8 Fr peel-away sheath was placed over a guidewire under fluoroscopic guidance. The catheter was then placed through the sheath and the sheath was removed. Final catheter positioning was confirmed and documented with a fluoroscopic spot radiograph. The port was accessed with a Huber needle, aspirated and flushed with heparinized saline. The venotomy site was closed with an interrupted 4-0 Vicryl suture. The port pocket incision was closed with interrupted 2-0 Vicryl suture.  Dermabond and Steri-strips were applied to both incisions. Dressings were applied. The patient tolerated the procedure well without immediate post procedural complication. FINDINGS: After catheter placement, the tip lies within the superior cavoatrial junction. The catheter aspirates and flushes normally and is ready for immediate use. IMPRESSION: Successful placement of a left internal jugular approach power injectable Port-A-Cath. The catheter is ready for immediate use. Electronically Signed   By: Sandi Mariscal M.D.   On: 07/26/2021 13:27     ASSESSMENT & PLAN: Patient is a 40 y.o. male with history of recent diagnosis of non-Hodgkin's lymphoma followed by oncologist Dr. Irene Limbo.  #)Nausea- Patient non toxic appearing. No abdominal pain on exam. HDS. Nausea started with treatment and he is reporting it is very mild. Symptom not consistent with adverse reaction to treatment based on timing os onset. Patient was given dose of ativan and nausea improved. Ativan chosen as he received emend and aloxi as premedications. He has nausea medications at home and we reviewed how and when to take them. I viewed his labs from yesterday to include CBC and CMP which are mostly unremarkable besides hyperglycemia of 225. He is prescribed prednisone to take during certain days of chemotherapy which could be contributing to hyperglycemia. He is not a known diabetic. Recommend he continue to this trended by oncologist.  #)Non-hodgkin's lymphoma- Patient here for first treatment. Dr. Irene Limbo informed of nausea and agreed with intervention. No indications to stop or hold treatment today.    Visit Diagnosis: 1. Nausea without vomiting   2. 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   All questions were answered. The patient knows to call the clinic with any problems, questions or concerns. No barriers to learning was detected.  I have spent a total of 20 minutes minutes of face-to-face and non-face-to-face time, preparing to see the patient, obtaining and/or reviewing separately obtained history, performing a medically appropriate examination, counseling and educating the patient, documenting clinical information in the electronic health record, and care coordination.     Thank you for allowing me to participate in the care of this patient.    Barrie Folk, PA-C Department of  Hematology/Oncology [facility] Phone: [phone]  [phone]    07/27/2021 2:08 PM

## 2021-07-27 NOTE — Progress Notes (Addendum)
[name]   HEMATOLOGY/ONCOLOGY CLINIC NOTE  Date of Service: 07/28/2021  Patient Care Team: [name], MD as PCP - General (Family Medicine)  CHIEF COMPLAINTS/PURPOSE OF CONSULTATION:  Follow-up for newly diagnosed high-grade large B-cell lymphoma  HISTORY OF PRESENTING ILLNESS:   Allen Hodge is a wonderful 40 y.o. male who has been referred to [place] by Dr. Owens Loffler MS for evaluation and management of newly diagnosed non-Hodgkin's lymphoma.  Patient is a otherwise healthy gentleman with previous history of basal cell skin cancer, herniated disc, nasal allergies, possible food allergies/intolerances with irregular bowel habits chronically.  Patient was referred to and was seen by Dr. Owens Loffler on 06/15/2021 with 1 month history of significant abdominal pain initially in the left upper quadrant with some radiation to the back and more bothersome when he lay flat at nighttime.  He also noted some increased pressure-like sensation in the epigastric area and in the center of the abdomen. Subsequently the abdominal pain was then more broadly present over the entire abdomen. He endorsed some nausea without vomiting, decreased bowel movements and a decrease in appetite.  No melena or hematochezia. No fevers no chills no night sweats.  No clearly defined significant weight loss.  Patient was started on Protonix 40 mg before breakfast every day and had a CT of the abdomen and pelvis with contrast on 06/21/2021 which showed bulky abdominal lymphadenopathy throughout retroperitoneum and small bowel mesentery.  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  Patient subsequently had a PET CT scan 07/04/2021 which showed hypermetabolic right supraclavicular and mediastinal adenopathy.  Metabolic activity with 2 subsolid nodules in the right upper lung concerning for lymphoma  involvement.  Bulky hypermetabolic central mesenteric adenopathy and retroperitoneal lymphadenopathy in the upper abdomen consistent with nodal lymphoma.  4.4 cm segment of hypermetabolic small bowel thickening consistent with lymphoma of the small bowel. No inguinal or Ilac hypermetabolic nodes.  No spleen or bone marrow involvement noted.  Patient subsequently had an upper endoscopic ultrasound on 06/29/2021 which showed multiple abdominal and mediastinal lymph nodes.  Abdominal lymphadenopathy was sampled and sent for cytology. Sample was insufficient for flow cytometric. Cytology showed cells consistent with malignant non-Hodgkin's lymphoma-subclassification and grading of the lymphoma was not possible based on limited sample.  Patient is here with continued abdominal discomfort.  He notes that he is still able to keep some food down and has not had any obvious vomiting.  Still having regular bowel movements.  Still following a gluten-free and primarily dairy free diet.  Notes no fevers no chills no night sweats no significant weight loss. No other focal symptoms other than abdominal discomfort. 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  INTERVAL HISTORY  Patient is here for follow-up of his newly diagnosed non-Hodgkin's lymphoma.  He notes that his abdominal discomfort has significantly improved since he was started on high-dose steroids pending final pathology results. He notes no fevers no chills no night sweats. Abdominal pain but is more manageable with his Norco. We discussed his pathology results with Dr. Dessie Coma -does show high-grade large B-cell lymphoma.  Molecular subtyping with FISH studies has been sent and is currently pending to evaluate for double/triple hit lymphoma. We discussed recommended treatment with R-CHOP with the possibility of switching to  da-EPOCH-R from cycle  3 based on molecular results. We discussed that he will need Port-A-Cath placement and urgent echocardiogram prior to starting chemotherapy. His lab results were reviewed and details with him.  MEDICAL HISTORY:  Past Medical History:  Diagnosis Date   Allergy    Cancer ([place])    skin cancer   Herniated disc    Hx of skin cancer, basal cell    Hyperlipidemia    borderline- no meds     SURGICAL HISTORY: Past Surgical History:  Procedure Laterality Date   COLONOSCOPY     last 2013   ESOPHAGOGASTRODUODENOSCOPY (EGD) WITH PROPOFOL N/A 06/29/2021   Procedure: ESOPHAGOGASTRODUODENOSCOPY (EGD) WITH PROPOFOL;  Surgeon: Milus Banister, MD;  Location: [facility];  Service: Endoscopy;  Laterality: N/A;   EUS N/A 06/29/2021   Procedure: UPPER ENDOSCOPIC ULTRASOUND (EUS) RADIAL;  Surgeon: Milus Banister, MD;  Location: [facility];  Service: Endoscopy;  Laterality: N/A;   FINE NEEDLE ASPIRATION N/A 06/29/2021   Procedure: FINE NEEDLE ASPIRATION (FNA) LINEAR;  Surgeon: Milus Banister, MD;  Location: [facility];  Service: Endoscopy;  Laterality: N/A;   IR IMAGING GUIDED PORT INSERTION  07/26/2021   VASECTOMY      SOCIAL HISTORY: Social History   Socioeconomic History   Marital status: Married    Spouse name: Not on file   Number of children: 1   Years of education: Not on file   Highest education level: Not on file  Occupational History   Occupation: Sales   Tobacco Use   Smoking status: Never   Smokeless tobacco: Never  Substance and Sexual Activity   Alcohol use: Not Currently    Alcohol/week: 1.0 standard drink    Types: 1 Glasses of wine per week    Comment: Rare    Drug use: No   Sexual activity: Not on file  Other Topics Concern   Not on file  Social History Narrative   Daily caffeine    Social Determinants of Health   Financial Resource Strain: Not on file  Food Insecurity: Not on file  Transportation Needs: Not on file  Physical  Activity: Not on file  Stress: Not on file  Social Connections: Not on file  Intimate Partner Violence: Not on file    FAMILY HISTORY: Family History  Problem Relation Age of Onset   Colon cancer Mother 28   Colon polyps Mother    Colon cancer Paternal Grandfather    Alzheimer's disease Paternal Grandfather    Breast cancer Maternal Aunt    Diabetes Maternal Grandmother    Prostate cancer Maternal Grandfather    Esophageal cancer Neg Hx    Rectal cancer Neg Hx    Stomach cancer Neg Hx     ALLERGIES:  is allergic to bee pollen-1000-royal jelly [nutritional supplements], gluten meal, and lactose intolerance (gi).  MEDICATIONS:  Current Outpatient Medications  Medication Sig Dispense Refill   allopurinol (ZYLOPRIM) 100 MG tablet Take 1 tablet (100 mg total) by mouth 2 (two) times daily. 60 tablet 0   Ascorbic Acid (VITA-C PO) Take 1 tablet by mouth 2 (two) times daily.     Cholecalciferol (VITAMIN D-3) 125 MCG (5000 UT) TABS Take 5,000 Units by mouth 2 (two) times daily.     HYDROcodone-acetaminophen (NORCO) 5-325 MG tablet Take 1-2 tablets by mouth every 6 (six) hours as needed for moderate pain. 50 tablet 0   ibuprofen (ADVIL) 200 MG tablet Take 400-600 mg by mouth every 6 (six) hours as needed for mild pain or  moderate pain.     lidocaine-prilocaine (EMLA) cream Apply to affected area once 30 g 3   LORazepam (ATIVAN) 0.5 MG tablet Take 1 tablet (0.5 mg total) by mouth every 6 (six) hours as needed (Nausea or vomiting). 30 tablet 0   magic mouthwash w/lidocaine SOLN Take 5 mLs by mouth 4 (four) times daily as needed for mouth pain. Rx: 50 mL viscous lidocaine 2% 50 mL Maalox 50 mL diphenhydramine (Benadryl) at 12.5 mg per 5 ml elixir 50 mL nystatin (100,000U per 5 mL) suspension 50 mL distilled water 250 mL 1   ondansetron (ZOFRAN) 8 MG tablet Take 1 tablet (8 mg total) by mouth 2 (two) times daily as needed for refractory nausea / vomiting. Start on day 3 after cyclophosphamide  chemotherapy. 30 tablet 1   pantoprazole (PROTONIX) 40 MG tablet Take 1 tablet (40 mg total) by mouth daily before breakfast. 30 tablet 2   predniSONE (DELTASONE) 20 MG tablet Take 3 tablets (60 mg total) by mouth daily. Take with food on days 2-6 of chemotherapy. 25 tablet 5   prochlorperazine (COMPAZINE) 10 MG tablet Take 1 tablet (10 mg total) by mouth every 6 (six) hours as needed (Nausea or vomiting). 30 tablet 6   No current facility-administered medications for this visit.    REVIEW OF SYSTEMS:    .10 Point review of Systems was done is negative except as noted above.  PHYSICAL EXAMINATION: ECOG PERFORMANCE STATUS: 1 - Symptomatic but completely ambulatory  . Vitals:   07/21/21 1119  BP: 118/85  Pulse: 74  Resp: 20  Temp: 97.7 F (36.5 C)  SpO2: 100%   Filed Weights   07/21/21 1119  Weight: 188 lb 14.4 oz (85.7 kg)   .Body mass index is 24.25 kg/m.  GENERAL:alert, in no acute distress and comfortable SKIN: no acute rashes, no significant lesions EYES: conjunctiva are pink and non-injected, sclera anicteric OROPHARYNX: MMM, no exudates, no oropharyngeal erythema or ulceration NECK: supple, no JVD LYMPH:  no palpable lymphadenopathy in the cervical, axillary or inguinal regions LUNGS: clear to auscultation b/l with normal respiratory effort HEART: regular rate & rhythm ABDOMEN:  normoactive bowel sounds , non tender, not distended. Extremity: no pedal edema PSYCH: alert & oriented x 3 with fluent speech NEURO: no focal motor/sensory deficits   LABORATORY DATA:   I have reviewed the data as listed  CBC Latest Ref Rng & Units 07/26/2021 07/11/2021 06/26/2021  WBC 4.0 - 10.5 K/uL 7.7 9.0 12.2(H)  Hemoglobin 13.0 - 17.0 g/dL 13.2 13.7 13.7  Hematocrit 39.0 - 52.0 % 39.9 41.3 41.3  Platelets 150 - 400 K/uL 397 381 345.0    . CMP Latest Ref Rng & Units 07/26/2021 07/11/2021 06/26/2021  Glucose 70 - 99 mg/dL 225(H) 86 99  BUN 6 - 20 mg/dL _0 Creatinine 0.61 - 1.24 mg/dL 0.94 0.98 1.62(H)  Sodium 135 - 145 mmol/L 138 136 136  Potassium 3.5 - 5.1 mmol/L 4.4 4.3 4.5  Chloride 98 - 111 mmol/L 105 100 98  CO2 22 - 32 mmol/L _1 Calcium 8.9 - 10.3 mg/dL 9.2 9.6 9.6  Total Protein 6.5 - 8.1 g/dL 6.7 7.7 7.5  Total Bilirubin 0.3 - 1.2 mg/dL 0.4 0.4 0.6  Alkaline Phos 38 - 126 U/L 57 58 55  AST 15 - 41 U/L 11(L) 16 11  ALT 0 - 44 U/L _2 Component     Latest Ref Rng & Units 07/11/2021  Color, Urine     YELLOW YELLOW  Appearance     CLEAR CLEAR  Specific Gravity, Urine     1.005 - 1.030 1.016  pH     5.0 - 8.0 5.0  Glucose, UA     NEGATIVE mg/dL NEGATIVE  Hgb urine dipstick     NEGATIVE NEGATIVE  Bilirubin Urine     NEGATIVE NEGATIVE  Ketones, ur     NEGATIVE mg/dL 20 (A)  Protein     NEGATIVE mg/dL NEGATIVE  Nitrite     NEGATIVE NEGATIVE  Leukocytes,Ua     NEGATIVE NEGATIVE  HIV Screen 4th Generation wRfx     Non Reactive Non Reactive  Hep B Core Total Ab     NON REACTIVE NON REACTIVE  Hepatitis B Surface Ag     NON REACTIVE NON REACTIVE  HCV Ab     NON REACTIVE NON REACTIVE  Uric Acid, Serum     3.7 - 8.6 mg/dL 8.0  LDH     98 - 192 U/L 300 (H)   SURGICAL PATHOLOGY  CASE: [id number]  PATIENT: Allen Hodge  Surgical Pathology Report      Specimen Submitted:  A. Lymph node, right neck   Clinical History: History of malignant non Hodgkin's lymphoma after EUS  biopsy on 11/17, however sample was insufficient to subclassify for  treatment planning.  Now post US guided bx of hypermetabolic right  supraclavicular lymph node       DIAGNOSIS:  A. LYMPH NODE, RIGHT NECK; ULTRASOUND-GUIDED BIOPSY:  - LARGE B-CELL LYMPHOMA.  - SEE COMMENT.   Comment:  Sections demonstrate sheets of large, markedly atypical cells with  prominent macronucleoli.  Mitotic activity is brisk.  Flow cytometry  demonstrates a CD5 negative, CD10 negative monoclonal B cell population.  Definitive  classification is dependent on prognostically significant IHC  and FISH studies, which will be reported in an addendum.   RADIOGRAPHIC STUDIES: I have personally reviewed the radiological images as listed and agreed with the findings in the report. NM PET Image Initial (PI) Skull Base To Thigh (F-18 FDG)  Result Date: 07/04/2021 CLINICAL DATA:  Initial treatment strategy for non-Hodgkin's lymphoma. EXAM: NUCLEAR MEDICINE PET SKULL BASE TO THIGH TECHNIQUE: 9.6 mCi F-18 FDG was injected intravenously. Full-ring PET imaging was performed from the skull base to thigh after the radiotracer. CT data was obtained and used for attenuation correction and anatomic localization. Fasting blood glucose: 99 mg/dl COMPARISON:  CT abdomen pelvis 06/21/2021 FINDINGS: Mediastinal blood pool activity: SUV max 2.2 Liver activity: SUV max 3.5 NECK: Cluster of hypermetabolic RIGHT supraclavicular node with SUV max equal 6.3. Nodes measure 1 cm each. Smaller RIGHT level 3 lymph node on image 46 with SUV max equal 4.1. Incidental CT findings: none CHEST: Intensely hypermetabolic mediastinal adenopathy. Cluster of high RIGHT paratracheal nodes measuring 10 mm short axis with SUV max equal 7.3. Cluster of hypermetabolic 1 cm subcarinal nodes with SUV max equal 7.6. Sub solid nodule in the RIGHT upper lobe measuring 7 mm (image 73/4) has associated metabolic activity with SUV max equal 2.5. Similar mild activity associated with sub solid nodule measuring 13 mm image 73/4. Incidental CT findings: none ABDOMEN/PELVIS: Bulky mat of nodal tissue within the abdominal mesentery with SUV max equal 11.1. Individual nodes measure up to 2 cm. Hypermetabolic retroperitoneal adenopathy in the upper abdomen about the celiac trunk. Segment of thickened small bowel measuring 4.4 cm in length (image 180/4) with intense metabolic activity (SUV max equal 16.4 on image  182). No iliac or inguinal hypermetabolic lymph nodes. Incidental CT findings: none  SKELETON: No focal hypermetabolic activity to suggest skeletal metastasis. Incidental CT findings: none IMPRESSION: 1. Hypermetabolic RIGHT supraclavicular and mediastinal adenopathy consistent with thoracic lymphoma. 2. Metabolic activity associated with two sub solid nodules in the RIGHT upper lobe is concerning for lymphoma involvement of the lung parenchyma. 3. Bulky hypermetabolic central mesenteric adenopathy and retroperitoneal adenopathy in the upper abdomen consistent with nodal lymphoma. 4. A 4.4 cm segment of hypermetabolic small bowel wall thickening consistent lymphoma involvement of the small-bowel. 5. No inguinal or iliac hypermetabolic nodes. 6. No spleen involvement or bone marrow involvement. Electronically Signed   By: Suzy Bouchard M.D.   On: 07/04/2021 16:32   CT BIOPSY  Result Date: 07/19/2021 INDICATION: Recent diagnosis non-Hodgkin's lymphoma, post endoscopic biopsy though with insignificant tissue sampling to allow for definitive characterization. Please perform CT-guided mesenteric mass biopsy and or ultrasound-guided right subclavicular navicular biopsy for tissue diagnostic purposes. EXAM: 1. ULTRASOUND-GUIDED RIGHT SUPRACLAVICULAR LYMPH NODE BIOPSY 2. CT-GUIDED MESENTERIC NODAL CONGLOMERATION BIOPSY COMPARISON:  PET-CT-07/04/2021; CT abdomen and pelvis-06/21/2021 MEDICATIONS: None. ANESTHESIA/SEDATION: Moderate (conscious) sedation was employed during this procedure as administered by the Interventional Radiology RN. A total of Versed 2 mg and Fentanyl 100 mcg was administered intravenously. Moderate Sedation Time: 26 minutes. The patient's level of consciousness and vital signs were monitored continuously by radiology nursing throughout the procedure under my direct supervision. CONTRAST:  None. COMPLICATIONS: None immediate. PROCEDURE: Informed consent was obtained from the patient following an explanation of the procedure, risks, benefits and alternatives. A time out was  performed prior to the initiation of the procedure. Sonographic evaluation was performed of the right supraclavicular fossa demonstrating several pathologically enlarged lymph nodes clustered within the medial aspect of the right supraclavicular fossa. The patient was then positioned supine on the CT gantry with preprocedural CT imaging demonstrating continued progression of mesenteric root and retroperitoneal lymphadenopathy with index mesenteric root nodal conglomeration measuring at least 10.7 x 8.0 cm (image 33, series 2). In an effort to establish a tissue diagnosis in the most expeditious manner decision was made to proceed with CT-guided biopsy of the mesenteric mass as well as ultrasound-guided biopsy of a dominant right supraclavicular lymph node. Attention was first paid towards CT-guided biopsy of the mesenteric mass. The procedure was planned. The operative site was prepped and draped in the usual sterile fashion. Appropriate trajectory was confirmed with a 22 gauge spinal needle after the adjacent tissues were anesthetized with 1% Lidocaine with epinephrine. Under intermittent CT guidance, a 17 gauge coaxial needle was advanced into the peripheral aspect of the mesenteric nodal conglomeration. Appropriate positioning was confirmed and 6 core needle biopsy samples were obtained with an 18 gauge core needle biopsy device. The co-axial needle was removed following the administration of a Gel-Foam slurry and superficial hemostasis was achieved with manual compression. Limited postprocedural CT was negative for hemorrhage or additional complication. A dressing was applied. _________________________________________________________ Attention was now paid towards ultrasound-guided biopsy of a dominant right supraclavicular lymph node. The operative site was prepped and draped in usual sterile fashion. After the overlying soft tissues were anesthetized with 1% lidocaine, 6 core needle biopsy samples were  obtained with an 18 gauge core needle biopsy device under direct ultrasound guidance. Multiple ultrasound images were saved procedural documentation purposes. Postprocedural imaging was negative for hemorrhage or additional complication. A dressing was applied The patient tolerated the above procedures well without immediate postprocedural complication. The patient tolerated the procedure well  without immediate postprocedural complication. IMPRESSION: 1. Technically successful CT guided core needle biopsy of mesenteric nodal conglomeration. 2. Technically successful ultrasound-guided biopsy of dominant right supraclavicular lymph node. Electronically Signed   By: Sandi Mariscal M.D.   On: 07/19/2021 15:30   ECHOCARDIOGRAM COMPLETE  Result Date: 07/25/2021    ECHOCARDIOGRAM REPORT   Patient Name:   Allen Hodge Date of Exam: 07/25/2021 Medical Rec #:  [id number]          Height:       74.0 in Accession #:    [id number]         Weight:       188.9 lb Date of Birth:  02-Sep-1980          BSA:          2.121 m Patient Age:    54 years           BP:           127/87 mmHg Patient Gender: M                  HR:           64 bpm. Exam Location:  Outpatient Procedure: 2D Echo, Cardiac Doppler, Color Doppler and Strain Analysis Indications:    Pre-Chemo  History:        Patient has no prior history of Echocardiogram examinations.                 Lymphoma.  Sonographer:    Merrie Roof RDCS Referring Phys: [id number] [place]  1. Left ventricular ejection fraction, by estimation, is 60 to 65%. The left ventricle has normal function. The left ventricle has no regional wall motion abnormalities. Left ventricular diastolic parameters were normal. The average left ventricular global longitudinal strain is -21.4 %. The global longitudinal strain is normal.  2. Right ventricular systolic function is normal. The right ventricular size is normal.  3. ? Port/catheter seen in RA.  4. The mitral valve is normal  in structure. No evidence of mitral valve regurgitation. No evidence of mitral stenosis.  5. The aortic valve is tricuspid. Aortic valve regurgitation is not visualized. No aortic stenosis is present.  6. The inferior vena cava is normal in size with greater than 50% respiratory variability, suggesting right atrial pressure of 3 mmHg. FINDINGS  Left Ventricle: Left ventricular ejection fraction, by estimation, is 60 to 65%. The left ventricle has normal function. The left ventricle has no regional wall motion abnormalities. The average left ventricular global longitudinal strain is -21.4 %. The global longitudinal strain is normal. The left ventricular internal cavity size was normal in size. There is no left ventricular hypertrophy. Left ventricular diastolic parameters were normal. Right Ventricle: The right ventricular size is normal. No increase in right ventricular wall thickness. Right ventricular systolic function is normal. Left Atrium: Left atrial size was normal in size. Right Atrium: ? Port/catheter seen in RA. Right atrial size was normal in size. Pericardium: There is no evidence of pericardial effusion. Mitral Valve: The mitral valve is normal in structure. No evidence of mitral valve regurgitation. No evidence of mitral valve stenosis. Tricuspid Valve: The tricuspid valve is normal in structure. Tricuspid valve regurgitation is not demonstrated. No evidence of tricuspid stenosis. Aortic Valve: The aortic valve is tricuspid. Aortic valve regurgitation is not visualized. No aortic stenosis is present. Aortic valve mean gradient measures 4.0 mmHg. Aortic valve peak gradient measures 6.9 mmHg. Aortic valve area, by VTI measures  3.30 cm. Pulmonic Valve: The pulmonic valve was normal in structure. Pulmonic valve regurgitation is not visualized. No evidence of pulmonic stenosis. Aorta: The aortic root is normal in size and structure. Venous: The inferior vena cava is normal in size with greater than 50%  respiratory variability, suggesting right atrial pressure of 3 mmHg. IAS/Shunts: No atrial level shunt detected by color flow Doppler.  LEFT VENTRICLE PLAX 2D LVIDd:         5.40 cm   Diastology LVIDs:         3.50 cm   LV e' medial:    13.70 cm/s LV PW:         0.90 cm   LV E/e' medial:  6.8 LV IVS:        0.70 cm   LV e' lateral:   17.40 cm/s LVOT diam:     2.20 cm   LV E/e' lateral: 5.4 LV SV:         82 LV SV Index:   39        2D Longitudinal Strain LVOT Area:     3.80 cm  2D Strain GLS Avg:     -21.4 %                           3D Volume EF:                          3D EF:        61 %                          LV EDV:       144 ml                          LV ESV:       56 ml                          LV SV:        88 ml RIGHT VENTRICLE RV Basal diam:  4.10 cm RV Mid diam:    3.40 cm LEFT ATRIUM             Index        RIGHT ATRIUM           Index LA diam:        3.60 cm 1.70 cm/m   RA Area:     26.00 cm LA Vol (A2C):   58.4 ml 27.53 ml/m  RA Volume:   97.20 ml  45.82 ml/m LA Vol (A4C):   55.1 ml 25.97 ml/m LA Biplane Vol: 57.6 ml 27.15 ml/m  AORTIC VALVE AV Area (Vmax):    3.40 cm AV Area (Vmean):   3.17 cm AV Area (VTI):     3.30 cm AV Vmax:           131.00 cm/s AV Vmean:          87.300 cm/s AV VTI:            0.250 m AV Peak Grad:      6.9 mmHg AV Mean Grad:      4.0 mmHg LVOT Vmax:         117.00 cm/s LVOT Vmean:        72.700 cm/s LVOT VTI:  0.217 m LVOT/AV VTI ratio: 0.87  AORTA Ao Root diam: 3.30 cm Ao Asc diam:  3.00 cm MITRAL VALVE MV Area (PHT): 4.80 cm    SHUNTS MV Decel Time: 158 msec    Systemic VTI:  0.22 m MV E velocity: 93.70 cm/s  Systemic Diam: 2.20 cm MV A velocity: 53.70 cm/s MV E/A ratio:  1.74 Jenkins Rouge MD Electronically signed by Jenkins Rouge MD Signature Date/Time: 07/25/2021/1:26:41 PM    Final    [place] CORE BIOPSY (LYMPH NODES)  Result Date: 07/19/2021 INDICATION: Recent diagnosis non-Hodgkin's lymphoma, post endoscopic biopsy though with insignificant tissue  sampling to allow for definitive characterization. Please perform CT-guided mesenteric mass biopsy and or ultrasound-guided right subclavicular navicular biopsy for tissue diagnostic purposes. EXAM: 1. ULTRASOUND-GUIDED RIGHT SUPRACLAVICULAR LYMPH NODE BIOPSY 2. CT-GUIDED MESENTERIC NODAL CONGLOMERATION BIOPSY COMPARISON:  PET-CT-07/04/2021; CT abdomen and pelvis-06/21/2021 MEDICATIONS: None. ANESTHESIA/SEDATION: Moderate (conscious) sedation was employed during this procedure as administered by the Interventional Radiology RN. A total of Versed 2 mg and Fentanyl 100 mcg was administered intravenously. Moderate Sedation Time: 26 minutes. The patient's level of consciousness and vital signs were monitored continuously by radiology nursing throughout the procedure under my direct supervision. CONTRAST:  None. COMPLICATIONS: None immediate. PROCEDURE: Informed consent was obtained from the patient following an explanation of the procedure, risks, benefits and alternatives. A time out was performed prior to the initiation of the procedure. Sonographic evaluation was performed of the right supraclavicular fossa demonstrating several pathologically enlarged lymph nodes clustered within the medial aspect of the right supraclavicular fossa. The patient was then positioned supine on the CT gantry with preprocedural CT imaging demonstrating continued progression of mesenteric root and retroperitoneal lymphadenopathy with index mesenteric root nodal conglomeration measuring at least 10.7 x 8.0 cm (image 33, series 2). In an effort to establish a tissue diagnosis in the most expeditious manner decision was made to proceed with CT-guided biopsy of the mesenteric mass as well as ultrasound-guided biopsy of a dominant right supraclavicular lymph node. Attention was first paid towards CT-guided biopsy of the mesenteric mass. The procedure was planned. The operative site was prepped and draped in the usual sterile fashion.  Appropriate trajectory was confirmed with a 22 gauge spinal needle after the adjacent tissues were anesthetized with 1% Lidocaine with epinephrine. Under intermittent CT guidance, a 17 gauge coaxial needle was advanced into the peripheral aspect of the mesenteric nodal conglomeration. Appropriate positioning was confirmed and 6 core needle biopsy samples were obtained with an 18 gauge core needle biopsy device. The co-axial needle was removed following the administration of a Gel-Foam slurry and superficial hemostasis was achieved with manual compression. Limited postprocedural CT was negative for hemorrhage or additional complication. A dressing was applied. _________________________________________________________ Attention was now paid towards ultrasound-guided biopsy of a dominant right supraclavicular lymph node. The operative site was prepped and draped in usual sterile fashion. After the overlying soft tissues were anesthetized with 1% lidocaine, 6 core needle biopsy samples were obtained with an 18 gauge core needle biopsy device under direct ultrasound guidance. Multiple ultrasound images were saved procedural documentation purposes. Postprocedural imaging was negative for hemorrhage or additional complication. A dressing was applied The patient tolerated the above procedures well without immediate postprocedural complication. The patient tolerated the procedure well without immediate postprocedural complication. IMPRESSION: 1. Technically successful CT guided core needle biopsy of mesenteric nodal conglomeration. 2. Technically successful ultrasound-guided biopsy of dominant right supraclavicular lymph node. Electronically Signed   By: Sandi Mariscal M.D.   On:  07/19/2021 15:30   IR IMAGING GUIDED PORT INSERTION  Result Date: 07/26/2021 INDICATION: History of lymphoma. In need of durable intravenous access for administration. EXAM: IMPLANTED PORT A CATH PLACEMENT WITH ULTRASOUND AND FLUOROSCOPIC GUIDANCE  COMPARISON:  PET-CT-07/04/2021 MEDICATIONS: None ANESTHESIA/SEDATION: Moderate (conscious) sedation was employed during this procedure. A total of Versed 4 mg and Fentanyl 100 mcg was administered intravenously. Moderate Sedation Time: 28 minutes. The patient's level of consciousness and vital signs were monitored continuously by radiology nursing throughout the procedure under my direct supervision. CONTRAST:  None FLUOROSCOPY TIME:  31 seconds (1 mGy) COMPLICATIONS: None immediate. PROCEDURE: The procedure, risks, benefits, and alternatives were explained to the patient. Questions regarding the procedure were encouraged and answered. The patient understands and consents to the procedure. Given the presence the known hypermetabolic right cervical lymphadenopathy, the decision was decision made to proceed with left internal jugular approach port a placement. The left neck and chest were prepped with chlorhexidine in a sterile fashion, and a sterile drape was applied covering the operative field. Maximum barrier sterile technique with sterile gowns and gloves were used for the procedure. A timeout was performed prior to the initiation of the procedure. Local anesthesia was provided with 1% lidocaine with epinephrine. After creating a small venotomy incision, a micropuncture kit was utilized to access the internal jugular vein. Real-time ultrasound guidance was utilized for vascular access including the acquisition of a permanent ultrasound image documenting patency of the accessed vessel. The microwire was utilized to measure appropriate catheter length. A subcutaneous port pocket was then created along the upper chest wall utilizing a combination of sharp and blunt dissection. The pocket was irrigated with sterile saline. A single lumen slim sized power injectable port was chosen for placement. The 8 Fr catheter was tunneled from the port pocket site to the venotomy incision. The port was placed in the pocket. The  external catheter was trimmed to appropriate length. At the venotomy, an 8 Fr peel-away sheath was placed over a guidewire under fluoroscopic guidance. The catheter was then placed through the sheath and the sheath was removed. Final catheter positioning was confirmed and documented with a fluoroscopic spot radiograph. The port was accessed with a Huber needle, aspirated and flushed with heparinized saline. The venotomy site was closed with an interrupted 4-0 Vicryl suture. The port pocket incision was closed with interrupted 2-0 Vicryl suture. Dermabond and Steri-strips were applied to both incisions. Dressings were applied. The patient tolerated the procedure well without immediate post procedural complication. FINDINGS: After catheter placement, the tip lies within the superior cavoatrial junction. The catheter aspirates and flushes normally and is ready for immediate use. IMPRESSION: Successful placement of a left internal jugular approach power injectable Port-A-Cath. The catheter is ready for immediate use. Electronically Signed   By: Sandi Mariscal M.D.   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  Patient has extensive abdominal and retroperitoneal lymphadenopathy, right supraclavicular lymphadenopathy and lung nodules concerning for lymphoma involvement as well as concern for small bowel involvement. Patient's blood counts are within normal limits. No overt splenic involvement or bone marrow involvement on PET scan. LDH 300-this would be suggestive of a high-grade process. Patient's progression of symptoms over the last 4 to 6 weeks also does not suggest a low-grade process.  2) pulmonary nodules concerning for lymphoma involvement 3) 4.4 cm small bowel  thickening concerning for involvement by lymphoma.  Cannot rule out adenocarcinoma but less  likely. 4) acute kidney injury.  Patient's creatinine had bumped to 1.6 but on repeat is back down to normal. Uric acid 8 5) Food intolerances including gluten and dairy avoidance.  PLAN -I discussed the patient's labs with him in detail. -We discussed his pathology results-showing new diagnosis of large B-cell lymphoma.  His high risk lymphoma FISH panel still pending.  Given significant abdominal involvement cannot rule out doublet lymphoma. -We discussed the natural history, treatment approaches, curative approach to treatment and treatment recommendations. -Would proceed with R-CHOP chemotherapy as soon as possible. -Urgent echocardiogram and Port-A-Cath placement. -If the patient has doublet lymphoma or high-grade large B-cell lymphoma not otherwise specified we will strongly consider switching to dose adjusted EPOCH-R from cycle 2. -We will also need to consider depending on his molecular profiling if there is a role for intrathecal methotrexate for CNS prophylaxis. -As needed Norco for pain management. -We will continue oral prednisone till he starts chemotherapy.  Follow-up  Urgent ECHO in 2-3 days Port a cath urgently ASAP Chemotherapy counseling for R-CHOP on Monday 07/24/2021 Please schedule to start chemotherapy on 07/26/2021 or 07/27/2021 with labs MD visit for toxicity check on 08/10/2021  Orders Placed This Encounter  Procedures   IR IMAGING GUIDED PORT INSERTION    Standing Status:   Future    Number of Occurrences:   1    Standing Expiration Date:   07/21/2022    Order Specific Question:   Reason for Exam (SYMPTOM  OR DIAGNOSIS REQUIRED)    Answer:   Port-A-Cath placement for chemotherapy for newly diagnosed high-grade B-cell lymphoma    Order Specific Question:   Preferred Imaging Location?    Answer:   [facility]     All of the patients questions were answered with apparent satisfaction. The patient knows to call the clinic with any problems, questions  or concerns. . The total time spent in the appointment was 42 minutes predominantly on discussing diagnoses, treatment plan, ordering chemotherapy and coordination of care will urgently get echo and Port-A-Cath placement and chemotherapy started.    Sullivan Lone MD [place] AAHIV[facility] Hematology/Oncology Physician [facility]  (Office):       [phone] (Work cell):  [phone] (Fax):           [phone]

## 2021-07-28 ENCOUNTER — Telehealth: Payer: Self-pay | Admitting: *Deleted

## 2021-07-28 NOTE — Telephone Encounter (Signed)
Called pt to see how he did with his treatment.  He reports sleeping a lot after getting home yest.  He has some nausea this am & took compazine which seems to help.  He denies any Bowel problems at this time & states he knows how to reach [place] if needed.  Encouraged to push fluids &  get in good nutrition.  Pt states he appreciates call

## 2021-07-29 ENCOUNTER — Inpatient Hospital Stay: Payer: No Typology Code available for payment source

## 2021-07-29 ENCOUNTER — Other Ambulatory Visit: Payer: Self-pay

## 2021-07-29 VITALS — BP 118/83 | HR 96 | Temp 97.5°F | Resp 18

## 2021-07-29 DIAGNOSIS — C8338 Diffuse large B-cell lymphoma, lymph nodes of multiple sites: Secondary | ICD-10-CM

## 2021-07-29 DIAGNOSIS — Z5112 Encounter for antineoplastic immunotherapy: Secondary | ICD-10-CM | POA: Diagnosis not present

## 2021-07-29 MED ORDER — PEGFILGRASTIM-BMEZ 6 MG/0.6ML ~~LOC~~ SOSY
6.0000 mg | PREFILLED_SYRINGE | Freq: Once | SUBCUTANEOUS | Status: AC
  Administered 2021-07-29: 6 mg via SUBCUTANEOUS
  Filled 2021-07-29: qty 0.6

## 2021-08-02 LAB — SURGICAL PATHOLOGY

## 2021-08-03 ENCOUNTER — Encounter: Payer: Self-pay | Admitting: Hematology

## 2021-08-04 ENCOUNTER — Other Ambulatory Visit (HOSPITAL_COMMUNITY): Payer: No Typology Code available for payment source

## 2021-08-09 ENCOUNTER — Other Ambulatory Visit: Payer: Self-pay | Admitting: *Deleted

## 2021-08-09 DIAGNOSIS — C8338 Diffuse large B-cell lymphoma, lymph nodes of multiple sites: Secondary | ICD-10-CM

## 2021-08-10 ENCOUNTER — Other Ambulatory Visit: Payer: No Typology Code available for payment source

## 2021-08-10 ENCOUNTER — Other Ambulatory Visit: Payer: Self-pay

## 2021-08-10 ENCOUNTER — Inpatient Hospital Stay (HOSPITAL_BASED_OUTPATIENT_CLINIC_OR_DEPARTMENT_OTHER): Payer: No Typology Code available for payment source | Admitting: Hematology

## 2021-08-10 ENCOUNTER — Inpatient Hospital Stay: Payer: No Typology Code available for payment source

## 2021-08-10 VITALS — BP 101/75 | HR 84 | Temp 97.7°F | Resp 14 | Wt 175.1 lb

## 2021-08-10 DIAGNOSIS — Z5112 Encounter for antineoplastic immunotherapy: Secondary | ICD-10-CM | POA: Diagnosis not present

## 2021-08-10 DIAGNOSIS — C8338 Diffuse large B-cell lymphoma, lymph nodes of multiple sites: Secondary | ICD-10-CM

## 2021-08-10 DIAGNOSIS — Z5111 Encounter for antineoplastic chemotherapy: Secondary | ICD-10-CM

## 2021-08-10 DIAGNOSIS — Z95828 Presence of other vascular implants and grafts: Secondary | ICD-10-CM | POA: Insufficient documentation

## 2021-08-10 LAB — CMP (CANCER CENTER ONLY)
ALT: 21 U/L (ref 0–44)
AST: 16 U/L (ref 15–41)
Albumin: 4.2 g/dL (ref 3.5–5.0)
Alkaline Phosphatase: 67 U/L (ref 38–126)
Anion gap: 8 (ref 5–15)
BUN: 8 mg/dL (ref 6–20)
CO2: 25 mmol/L (ref 22–32)
Calcium: 9.6 mg/dL (ref 8.9–10.3)
Chloride: 104 mmol/L (ref 98–111)
Creatinine: 0.87 mg/dL (ref 0.61–1.24)
GFR, Estimated: >60 mL/min (ref >60)
Glucose, Bld: 108 mg/dL — ABNORMAL HIGH (ref 70–99)
Potassium: 4.3 mmol/L (ref 3.5–5.1)
Sodium: 137 mmol/L (ref 135–145)
Total Bilirubin: 0.3 mg/dL (ref 0.3–1.2)
Total Protein: 6.8 g/dL (ref 6.5–8.1)

## 2021-08-10 LAB — CBC WITH DIFFERENTIAL (CANCER CENTER ONLY)
Abs Immature Granulocytes: 0.83 10*3/uL — ABNORMAL HIGH (ref 0.00–0.07)
Basophils Absolute: 0.2 10*3/uL — ABNORMAL HIGH (ref 0.0–0.1)
Basophils Relative: 1 %
Eosinophils Absolute: 0.1 10*3/uL (ref 0.0–0.5)
Eosinophils Relative: 0 %
HCT: 37.6 % — ABNORMAL LOW (ref 39.0–52.0)
Hemoglobin: 12.7 g/dL — ABNORMAL LOW (ref 13.0–17.0)
Immature Granulocytes: 7 %
Lymphocytes Relative: 15 %
Lymphs Abs: 2 10*3/uL (ref 0.7–4.0)
MCH: 28.9 pg (ref 26.0–34.0)
MCHC: 33.8 g/dL (ref 30.0–36.0)
MCV: 85.5 fL (ref 80.0–100.0)
Monocytes Absolute: 1 10*3/uL (ref 0.1–1.0)
Monocytes Relative: 8 %
Neutro Abs: 8.7 10*3/uL — ABNORMAL HIGH (ref 1.7–7.7)
Neutrophils Relative %: 69 %
Platelet Count: 221 10*3/uL (ref 150–400)
RBC: 4.4 MIL/uL (ref 4.22–5.81)
RDW: 14.1 % (ref 11.5–15.5)
WBC Count: 12.7 10*3/uL — ABNORMAL HIGH (ref 4.0–10.5)
nRBC: 0 % (ref 0.0–0.2)

## 2021-08-10 LAB — LACTATE DEHYDROGENASE: LDH: 275 U/L — ABNORMAL HIGH (ref 98–192)

## 2021-08-10 MED ORDER — HEPARIN SOD (PORK) LOCK FLUSH 100 UNIT/ML IV SOLN
500.0000 [IU] | Freq: Once | INTRAVENOUS | Status: AC
  Administered 2021-08-10: 13:00:00 500 [IU]

## 2021-08-10 MED ORDER — SODIUM CHLORIDE 0.9% FLUSH
10.0000 mL | Freq: Once | INTRAVENOUS | Status: AC
  Administered 2021-08-10: 13:00:00 10 mL

## 2021-08-13 ENCOUNTER — Other Ambulatory Visit: Payer: Self-pay | Admitting: Hematology

## 2021-08-13 DIAGNOSIS — C8338 Diffuse large B-cell lymphoma, lymph nodes of multiple sites: Secondary | ICD-10-CM

## 2021-08-16 ENCOUNTER — Other Ambulatory Visit: Payer: Self-pay | Admitting: Hematology

## 2021-08-16 ENCOUNTER — Other Ambulatory Visit: Payer: Self-pay

## 2021-08-16 DIAGNOSIS — C8338 Diffuse large B-cell lymphoma, lymph nodes of multiple sites: Secondary | ICD-10-CM

## 2021-08-16 MED FILL — Fosaprepitant Dimeglumine For IV Infusion 150 MG (Base Eq): INTRAVENOUS | Qty: 5 | Status: AC

## 2021-08-16 MED FILL — Dexamethasone Sodium Phosphate Inj 100 MG/10ML: INTRAMUSCULAR | Qty: 1 | Status: AC

## 2021-08-17 ENCOUNTER — Encounter: Payer: Self-pay | Admitting: Hematology

## 2021-08-17 ENCOUNTER — Other Ambulatory Visit: Payer: Self-pay

## 2021-08-17 ENCOUNTER — Inpatient Hospital Stay: Payer: No Typology Code available for payment source | Attending: Hematology

## 2021-08-17 ENCOUNTER — Inpatient Hospital Stay: Payer: No Typology Code available for payment source

## 2021-08-17 VITALS — BP 102/61 | HR 87 | Temp 98.0°F | Resp 18 | Wt 175.1 lb

## 2021-08-17 DIAGNOSIS — Z5111 Encounter for antineoplastic chemotherapy: Secondary | ICD-10-CM | POA: Insufficient documentation

## 2021-08-17 DIAGNOSIS — Z5189 Encounter for other specified aftercare: Secondary | ICD-10-CM | POA: Diagnosis not present

## 2021-08-17 DIAGNOSIS — Z5112 Encounter for antineoplastic immunotherapy: Secondary | ICD-10-CM | POA: Diagnosis not present

## 2021-08-17 DIAGNOSIS — C8590 Non-Hodgkin lymphoma, unspecified, unspecified site: Secondary | ICD-10-CM | POA: Diagnosis present

## 2021-08-17 DIAGNOSIS — C8338 Diffuse large B-cell lymphoma, lymph nodes of multiple sites: Secondary | ICD-10-CM

## 2021-08-17 DIAGNOSIS — Z95828 Presence of other vascular implants and grafts: Secondary | ICD-10-CM

## 2021-08-17 LAB — CMP (CANCER CENTER ONLY)
ALT: 12 U/L (ref 0–44)
AST: 13 U/L — ABNORMAL LOW (ref 15–41)
Albumin: 4.4 g/dL (ref 3.5–5.0)
Alkaline Phosphatase: 57 U/L (ref 38–126)
Anion gap: 9 (ref 5–15)
BUN: 12 mg/dL (ref 6–20)
CO2: 25 mmol/L (ref 22–32)
Calcium: 9.7 mg/dL (ref 8.9–10.3)
Chloride: 104 mmol/L (ref 98–111)
Creatinine: 0.83 mg/dL (ref 0.61–1.24)
GFR, Estimated: >60 mL/min (ref >60)
Glucose, Bld: 146 mg/dL — ABNORMAL HIGH (ref 70–99)
Potassium: 4.1 mmol/L (ref 3.5–5.1)
Sodium: 138 mmol/L (ref 135–145)
Total Bilirubin: 0.3 mg/dL (ref 0.3–1.2)
Total Protein: 6.9 g/dL (ref 6.5–8.1)

## 2021-08-17 LAB — CBC WITH DIFFERENTIAL (CANCER CENTER ONLY)
Abs Immature Granulocytes: 0.04 10*3/uL (ref 0.00–0.07)
Basophils Absolute: 0.1 10*3/uL (ref 0.0–0.1)
Basophils Relative: 1 %
Eosinophils Absolute: 0 10*3/uL (ref 0.0–0.5)
Eosinophils Relative: 1 %
HCT: 38.9 % — ABNORMAL LOW (ref 39.0–52.0)
Hemoglobin: 12.8 g/dL — ABNORMAL LOW (ref 13.0–17.0)
Immature Granulocytes: 1 %
Lymphocytes Relative: 20 %
Lymphs Abs: 1.6 10*3/uL (ref 0.7–4.0)
MCH: 28.8 pg (ref 26.0–34.0)
MCHC: 32.9 g/dL (ref 30.0–36.0)
MCV: 87.6 fL (ref 80.0–100.0)
Monocytes Absolute: 0.6 10*3/uL (ref 0.1–1.0)
Monocytes Relative: 7 %
Neutro Abs: 5.5 10*3/uL (ref 1.7–7.7)
Neutrophils Relative %: 70 %
Platelet Count: 273 10*3/uL (ref 150–400)
RBC: 4.44 MIL/uL (ref 4.22–5.81)
RDW: 14.9 % (ref 11.5–15.5)
WBC Count: 7.8 10*3/uL (ref 4.0–10.5)
nRBC: 0 % (ref 0.0–0.2)

## 2021-08-17 LAB — LACTATE DEHYDROGENASE: LDH: 268 U/L — ABNORMAL HIGH (ref 98–192)

## 2021-08-17 MED ORDER — SODIUM CHLORIDE 0.9 % IV SOLN
750.0000 mg/m2 | Freq: Once | INTRAVENOUS | Status: AC
  Administered 2021-08-17: 1600 mg via INTRAVENOUS
  Filled 2021-08-17: qty 80

## 2021-08-17 MED ORDER — PALONOSETRON HCL INJECTION 0.25 MG/5ML
0.2500 mg | Freq: Once | INTRAVENOUS | Status: AC
  Administered 2021-08-17: 0.25 mg via INTRAVENOUS
  Filled 2021-08-17: qty 5

## 2021-08-17 MED ORDER — HEPARIN SOD (PORK) LOCK FLUSH 100 UNIT/ML IV SOLN
500.0000 [IU] | Freq: Once | INTRAVENOUS | Status: AC | PRN
  Administered 2021-08-17: 500 [IU]

## 2021-08-17 MED ORDER — SODIUM CHLORIDE 0.9 % IV SOLN: 375.0000 mg/m2 | Freq: Once | INTRAVENOUS | Status: DC

## 2021-08-17 MED ORDER — SODIUM CHLORIDE 0.9 % IV SOLN: Freq: Once | INTRAVENOUS | Status: AC

## 2021-08-17 MED ORDER — VINCRISTINE SULFATE CHEMO INJECTION 1 MG/ML
2.0000 mg | Freq: Once | INTRAVENOUS | Status: AC
  Administered 2021-08-17: 2 mg via INTRAVENOUS
  Filled 2021-08-17: qty 2

## 2021-08-17 MED ORDER — SODIUM CHLORIDE 0.9 % IV SOLN
150.0000 mg | Freq: Once | INTRAVENOUS | Status: AC
  Administered 2021-08-17: 150 mg via INTRAVENOUS
  Filled 2021-08-17: qty 150

## 2021-08-17 MED ORDER — LORAZEPAM 1 MG PO TABS
0.5000 mg | ORAL_TABLET | Freq: Once | ORAL | Status: AC
  Administered 2021-08-17: 0.5 mg via ORAL
  Filled 2021-08-17: qty 1

## 2021-08-17 MED ORDER — LORAZEPAM 0.5 MG PO TABS: 0.5000 mg | ORAL_TABLET | Freq: Four times a day (QID) | ORAL | 0 refills | Status: DC | PRN

## 2021-08-17 MED ORDER — SODIUM CHLORIDE 0.9 % IV SOLN
375.0000 mg/m2 | Freq: Once | INTRAVENOUS | Status: AC
  Administered 2021-08-17: 800 mg via INTRAVENOUS
  Filled 2021-08-17: qty 30

## 2021-08-17 MED ORDER — SODIUM CHLORIDE 0.9 % IV SOLN
10.0000 mg | Freq: Once | INTRAVENOUS | Status: AC
  Administered 2021-08-17: 10 mg via INTRAVENOUS
  Filled 2021-08-17: qty 10

## 2021-08-17 MED ORDER — SODIUM CHLORIDE 0.9% FLUSH
10.0000 mL | INTRAVENOUS | Status: DC | PRN
  Administered 2021-08-17: 10 mL

## 2021-08-17 MED ORDER — FAMOTIDINE 20 MG IN NS 100 ML IVPB
20.0000 mg | Freq: Once | INTRAVENOUS | Status: AC
  Administered 2021-08-17: 20 mg via INTRAVENOUS
  Filled 2021-08-17: qty 100

## 2021-08-17 MED ORDER — ACETAMINOPHEN 325 MG PO TABS
650.0000 mg | ORAL_TABLET | Freq: Once | ORAL | Status: AC
  Administered 2021-08-17: 650 mg via ORAL
  Filled 2021-08-17: qty 2

## 2021-08-17 MED ORDER — DIPHENHYDRAMINE HCL 25 MG PO CAPS
50.0000 mg | ORAL_CAPSULE | Freq: Once | ORAL | Status: AC
  Administered 2021-08-17: 50 mg via ORAL
  Filled 2021-08-17: qty 2

## 2021-08-17 MED ORDER — SODIUM CHLORIDE 0.9% FLUSH
10.0000 mL | Freq: Once | INTRAVENOUS | Status: AC
  Administered 2021-08-17: 10 mL

## 2021-08-17 MED ORDER — DOXORUBICIN HCL CHEMO IV INJECTION 2 MG/ML
50.0000 mg/m2 | Freq: Once | INTRAVENOUS | Status: AC
  Administered 2021-08-17: 106 mg via INTRAVENOUS
  Filled 2021-08-17: qty 53

## 2021-08-17 NOTE — Progress Notes (Addendum)
Rapid Infusion Rituximab Pharmacist Evaluation  Press [name] is a 41 y.o. male being treated with rituximab for DLBCL. This patient may be considered for RIR.   A pharmacist has verified the patient tolerated rituximab infusions per the [facility] standard infusion protocol without grade 3-4 infusion reactions. The treatment plan will be updated to reflect RIR if the patient qualifies per the checklist below:   Age > 52 years old Yes   Clinically significant cardiovascular disease No   Circulating lymphocyte count < 5000/uL prior to cycle two Yes  Lab Results  Component Value Date   LYMPHSABS 1.6 08/17/2021    Prior documented grade 3-4 infusion reaction to rituximab No   Prior documented grade 1-2 infusion reaction to rituximab (If YES, Pharmacist will confirm with Physician if patient is still a candidate for RIR) No   Previous rituximab infusion within the past 6 months Yes  Treatment Plan updated orders to reflect RIR Yes    [name] does meet the criteria for Rapid Infusion Rituximab. Patient had some nausea with his first cycle due to "his nerves" prior to receiving his rituximab. This patient is going to be switched to rapid infusion rituximab.   Raul Del W[place]kon, BCPS, BCOP 08/17/21 2:13 PM

## 2021-08-17 NOTE — Patient Instructions (Signed)
[facility]  Discharge Instructions: Thank you for choosing [facility] to provide your oncology and hematology care.   If you have a lab appointment with the [facility], please go directly to the [facility] and check in at the registration area.   Wear comfortable clothing and clothing appropriate for easy access to any Portacath or PICC line.   We strive to give you quality time with your provider. You may need to reschedule your appointment if you arrive late (15 or more minutes).  Arriving late affects you and other patients whose appointments are after yours.  Also, if you miss three or more appointments without notifying the office, you may be dismissed from the clinic at the providers discretion.      For prescription refill requests, have your pharmacy contact our office and allow 72 hours for refills to be completed.    Today you received the following chemotherapy and/or immunotherapy agents rituxan, cytoxan, vincristine, adriamycin      To help prevent nausea and vomiting after your treatment, we encourage you to take your nausea medication as directed.  BELOW ARE SYMPTOMS THAT SHOULD BE REPORTED IMMEDIATELY: *FEVER GREATER THAN 100.4 F (38 C) OR HIGHER *CHILLS OR SWEATING *NAUSEA AND VOMITING THAT IS NOT CONTROLLED WITH YOUR NAUSEA MEDICATION *UNUSUAL SHORTNESS OF BREATH *UNUSUAL BRUISING OR BLEEDING *URINARY PROBLEMS (pain or burning when urinating, or frequent urination) *BOWEL PROBLEMS (unusual diarrhea, constipation, pain near the anus) TENDERNESS IN MOUTH AND THROAT WITH OR WITHOUT PRESENCE OF ULCERS (sore throat, sores in mouth, or a toothache) UNUSUAL RASH, SWELLING OR PAIN  UNUSUAL VAGINAL DISCHARGE OR ITCHING   Items with * indicate a potential emergency and should be followed up as soon as possible or go to the Emergency Department if any problems should occur.  Please show the CHEMOTHERAPY ALERT CARD or  IMMUNOTHERAPY ALERT CARD at check-in to the Emergency Department and triage nurse.  Should you have questions after your visit or need to cancel or reschedule your appointment, please contact [facility]  Dept: [phone]  and follow the prompts.  Office hours are 8:00 a.m. to 4:30 p.m. Monday - Friday. Please note that voicemails left after 4:00 p.m. may not be returned until the following business day.  We are closed weekends and major holidays. You have access to a nurse at all times for urgent questions. Please call the main number to the clinic Dept: [phone] and follow the prompts.   For any non-urgent questions, you may also contact your provider using MyChart. We now offer e-Visits for anyone 74 and older to request care online for non-urgent symptoms. For details visit [email or web address].   Also download the MyChart app! Go to the app store, search "MyChart", open the app, select [facility], and log in with your MyChart username and password.  Due to Covid, a mask is required upon entering the hospital/clinic. If you do not have a mask, one will be given to you upon arrival. For doctor visits, patients may have 1 support person aged 57 or older with them. For treatment visits, patients cannot have anyone with them due to current Covid guidelines and our immunocompromised population.

## 2021-08-18 ENCOUNTER — Encounter: Payer: Self-pay | Admitting: Hematology

## 2021-08-18 NOTE — Progress Notes (Addendum)
[name]   HEMATOLOGY/ONCOLOGY CLINIC NOTE  Date of Service: .08/10/2021   Patient Care Team: [name], MD as PCP - General (Family Medicine)  CHIEF COMPLAINTS/PURPOSE OF CONSULTATION:  Follow-up for recently diagnosed high-grade large B-cell lymphoma prior to second cycle of chemoimmunotherapy for toxicity check  HISTORY OF PRESENTING ILLNESS:   Allen Hodge is a wonderful 41 y.o. male who has been referred to [place] by Dr. Owens Loffler MS for evaluation and management of newly diagnosed non-Hodgkin's lymphoma.  Patient is a otherwise healthy gentleman with previous history of basal cell skin cancer, herniated disc, nasal allergies, possible food allergies/intolerances with irregular bowel habits chronically.  Patient was referred to and was seen by Dr. Owens Loffler on 06/15/2021 with 1 month history of significant abdominal pain initially in the left upper quadrant with some radiation to the back and more bothersome when he lay flat at nighttime.  He also noted some increased pressure-like sensation in the epigastric area and in the center of the abdomen. Subsequently the abdominal pain was then more broadly present over the entire abdomen. He endorsed some nausea without vomiting, decreased bowel movements and a decrease in appetite.  No melena or hematochezia. No fevers no chills no night sweats.  No clearly defined significant weight loss.  Patient was started on Protonix 40 mg before breakfast every day and had a CT of the abdomen and pelvis with contrast on 06/21/2021 which showed bulky abdominal lymphadenopathy throughout retroperitoneum and small bowel mesentery.  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  Patient subsequently had a PET CT scan 07/04/2021 which showed hypermetabolic right supraclavicular and mediastinal adenopathy.  Metabolic activity with 2  subsolid nodules in the right upper lung concerning for lymphoma involvement.  Bulky hypermetabolic central mesenteric adenopathy and retroperitoneal lymphadenopathy in the upper abdomen consistent with nodal lymphoma.  4.4 cm segment of hypermetabolic small bowel thickening consistent with lymphoma of the small bowel. No inguinal or Il[place] hypermetabolic nodes.  No spleen or bone marrow involvement noted.  Patient subsequently had an upper endoscopic ultrasound on 06/29/2021 which showed multiple abdominal and mediastinal lymph nodes.  Abdominal lymphadenopathy was sampled and sent for cytology. Sample was insufficient for flow cytometric. Cytology showed cells consistent with malignant non-Hodgkin's lymphoma-subclassification and grading of the lymphoma was not possible based on limited sample.  Patient is here with continued abdominal discomfort.  He notes that he is still able to keep some food down and has not had any obvious vomiting.  Still having regular bowel movements.  Still following a gluten-free and primarily dairy free diet.  Notes no fevers no chills no night sweats no significant weight loss. No other focal symptoms other than abdominal discomfort. 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  INTERVAL HISTORY  Patient is here for follow-up after his first cycle of R-CHOP chemoimmunotherapy for toxicity check prior to his second cycle of chemotherapy. He notes some grade 1 fatigue soon after his chemotherapy.  No fevers no chills no signs of infection.  Notes that in his last week or so he is starting to bounce back with improved appetite and energy levels.  No significant mouth soreness. No skin rashes. No other prohibitive toxicities noted. Labs today show stable CBC and CMP.  LDH still elevated at 268 likely  from his growth factor shot. We discussed his  high risk lymphoma FISH panel which shows no MEDICAL HISTORY:  Past Medical History:  Diagnosis Date   Allergy    Cancer ([place])    skin cancer   Herniated disc    Hx of skin cancer, basal cell    Hyperlipidemia    borderline- no meds     SURGICAL HISTORY: Past Surgical History:  Procedure Laterality Date   COLONOSCOPY     last 2013   ESOPHAGOGASTRODUODENOSCOPY (EGD) WITH PROPOFOL N/A 06/29/2021   Procedure: ESOPHAGOGASTRODUODENOSCOPY (EGD) WITH PROPOFOL;  Surgeon: Milus Banister, MD;  Location: [facility];  Service: Endoscopy;  Laterality: N/A;   EUS N/A 06/29/2021   Procedure: UPPER ENDOSCOPIC ULTRASOUND (EUS) RADIAL;  Surgeon: Milus Banister, MD;  Location: [facility];  Service: Endoscopy;  Laterality: N/A;   FINE NEEDLE ASPIRATION N/A 06/29/2021   Procedure: FINE NEEDLE ASPIRATION (FNA) LINEAR;  Surgeon: Milus Banister, MD;  Location: [facility];  Service: Endoscopy;  Laterality: N/A;   IR IMAGING GUIDED PORT INSERTION  07/26/2021   VASECTOMY      SOCIAL HISTORY: Social History   Socioeconomic History   Marital status: Married    Spouse name: Not on file   Number of children: 1   Years of education: Not on file   Highest education level: Not on file  Occupational History   Occupation: Sales   Tobacco Use   Smoking status: Never   Smokeless tobacco: Never  Substance and Sexual Activity   Alcohol use: Not Currently    Alcohol/week: 1.0 standard drink    Types: 1 Glasses of wine per week    Comment: Rare    Drug use: No   Sexual activity: Not on file  Other Topics Concern   Not on file  Social History Narrative   Daily caffeine    Social Determinants of Health   Financial Resource Strain: Not on file  Food Insecurity: Not on file  Transportation Needs: Not on file  Physical Activity: Not on file  Stress: Not on file  Social Connections: Not on file  Intimate Partner Violence: Not on file    FAMILY HISTORY: Family History  Problem Relation  Age of Onset   Colon cancer Mother 53   Colon polyps Mother    Colon cancer Paternal Grandfather    Alzheimer's disease Paternal Grandfather    Breast cancer Maternal Aunt    Diabetes Maternal Grandmother    Prostate cancer Maternal Grandfather    Esophageal cancer Neg Hx    Rectal cancer Neg Hx    Stomach cancer Neg Hx     ALLERGIES:  is allergic to bee pollen-1000-royal jelly [nutritional supplements], gluten meal, and lactose intolerance (gi).  MEDICATIONS:  Current Outpatient Medications  Medication Sig Dispense Refill   allopurinol (ZYLOPRIM) 100 MG tablet Take 1 tablet (100 mg total) by mouth 2 (two) times daily. 60 tablet 0   Ascorbic Acid (VITA-C PO) Take 1 tablet by mouth 2 (two) times daily.     Cholecalciferol (VITAMIN D-3) 125 MCG (5000 UT) TABS Take 5,000 Units by mouth 2 (two) times daily.     HYDROcodone-acetaminophen (NORCO) 5-325 MG tablet Take 1-2 tablets by mouth every 6 (six) hours as needed for moderate pain. 50 tablet 0   ibuprofen (ADVIL) 200 MG tablet Take 400-600 mg by mouth every 6 (six) hours as needed for mild pain or moderate pain.     lidocaine-prilocaine (EMLA) cream Apply to affected area  once 30 g 3   LORazepam (ATIVAN) 0.5 MG tablet Take 1 tablet (0.5 mg total) by mouth every 6 (six) hours as needed (Nausea or vomiting). 30 tablet 0   magic mouthwash w/lidocaine SOLN Take 5 mLs by mouth 4 (four) times daily as needed for mouth pain. Rx: 50 mL viscous lidocaine 2% 50 mL Maalox 50 mL diphenhydramine (Benadryl) at 12.5 mg per 5 ml elixir 50 mL nystatin (100,000U per 5 mL) suspension 50 mL distilled water 250 mL 1   ondansetron (ZOFRAN) 8 MG tablet Take 1 tablet (8 mg total) by mouth 2 (two) times daily as needed for refractory nausea / vomiting. Start on day 3 after cyclophosphamide chemotherapy. 30 tablet 1   pantoprazole (PROTONIX) 40 MG tablet Take 1 tablet (40 mg total) by mouth daily before breakfast. 30 tablet 2   predniSONE (DELTASONE) 20 MG  tablet Take 3 tablets (60 mg total) by mouth daily. Take with food on days 2-6 of chemotherapy. 25 tablet 5   prochlorperazine (COMPAZINE) 10 MG tablet Take 1 tablet (10 mg total) by mouth every 6 (six) hours as needed (Nausea or vomiting). 30 tablet 6   No current facility-administered medications for this visit.    REVIEW OF SYSTEMS:    .10 Point review of Systems was done is negative except as noted above.  PHYSICAL EXAMINATION: ECOG PERFORMANCE STATUS: 1 - Symptomatic but completely ambulatory  . Vitals:   08/10/21 1344  BP: 101/75  Pulse: 84  Resp: 14  Temp: 97.7 F (36.5 C)  SpO2: 100%   Filed Weights   08/10/21 1344  Weight: 175 lb 1.6 oz (79.4 kg)   .Body mass index is 22.48 kg/m.  GENERAL:alert, in no acute distress and comfortable SKIN: no acute rashes, no significant lesions EYES: conjunctiva are pink and non-injected, sclera anicteric OROPHARYNX: MMM, no exudates, no oropharyngeal erythema or ulceration NECK: supple, no JVD LYMPH:  no palpable lymphadenopathy in the cervical, axillary or inguinal regions LUNGS: clear to auscultation b/l with normal respiratory effort HEART: regular rate & rhythm ABDOMEN:  normoactive bowel sounds , non tender, not distended. Extremity: no pedal edema PSYCH: alert & oriented x 3 with fluent speech NEURO: no focal motor/sensory deficits   LABORATORY DATA:   I have reviewed the data as listed  CBC Latest Ref Rng & Units 08/17/2021 08/10/2021 07/26/2021  WBC 4.0 - 10.5 K/uL 7.8 12.7(H) 7.7  Hemoglobin 13.0 - 17.0 g/dL 12.8(L) 12.7(L) 13.2  Hematocrit 39.0 - 52.0 % 38.9(L) 37.6(L) 39.9  Platelets 150 - 400 K/uL 273 221 397    . CMP Latest Ref Rng & Units 08/17/2021 08/10/2021 07/26/2021  Glucose 70 - 99 mg/dL 146(H) 108(H) 225(H)  BUN 6 - 20 mg/dL '12 8 14  ' Creatinine 0.61 - 1.24 mg/dL 0.83 0.87 0.94  Sodium 135 - 145 mmol/L 138 137 138  Potassium 3.5 - 5.1 mmol/L 4.1 4.3 4.4  Chloride 98 - 111 mmol/L 104 104 105  CO2  22 - 32 mmol/L '25 25 23  ' Calcium 8.9 - 10.3 mg/dL 9.7 9.6 9.2  Total Protein 6.5 - 8.1 g/dL 6.9 6.8 6.7  Total Bilirubin 0.3 - 1.2 mg/dL 0.3 0.3 0.4  Alkaline Phos 38 - 126 U/L 57 67 57  AST 15 - 41 U/L 13(L) 16 11(L)  ALT 0 - 44 U/L '12 21 27   ' Component     Latest Ref Rng & Units 07/11/2021  Color, Urine     YELLOW YELLOW  Appearance  CLEAR CLEAR  Specific Gravity, Urine     1.005 - 1.030 1.016  pH     5.0 - 8.0 5.0  Glucose, UA     NEGATIVE mg/dL NEGATIVE  Hgb urine dipstick     NEGATIVE NEGATIVE  Bilirubin Urine     NEGATIVE NEGATIVE  Ketones, ur     NEGATIVE mg/dL 20 (A)  Protein     NEGATIVE mg/dL NEGATIVE  Nitrite     NEGATIVE NEGATIVE  Leukocytes,Ua     NEGATIVE NEGATIVE  HIV Screen 4th Generation wRfx     Non Reactive Non Reactive  Hep B Core Total Ab     NON REACTIVE NON REACTIVE  Hepatitis B Surface Ag     NON REACTIVE NON REACTIVE  HCV Ab     NON REACTIVE NON REACTIVE  Uric Acid, Serum     3.7 - 8.6 mg/dL 8.0  LDH     98 - 192 U/L 300 (H)   SURGICAL PATHOLOGY  CASE: [phone]  PATIENT: [name]  Surgical Pathology Report      Specimen Submitted:  A. Lymph node, right neck   Clinical History: History of malignant non Hodgkin's lymphoma after EUS  biopsy on 11/17, however sample was insufficient to subclassify for  treatment planning.  Now post US guided bx of hypermetabolic right  supraclavicular lymph node       DIAGNOSIS:  A. LYMPH NODE, RIGHT NECK; ULTRASOUND-GUIDED BIOPSY:  - LARGE B-CELL LYMPHOMA.  - SEE COMMENT.   Comment:  Sections demonstrate sheets of large, markedly atypical cells with  prominent macronucleoli.  Mitotic activity is brisk.  Flow cytometry  demonstrates a CD5 negative, CD10 negative monoclonal B cell population.  Definitive classification is dependent on prognostically significant IHC  and FISH studies, which will be reported in an addendum.      RADIOGRAPHIC STUDIES: I have personally  reviewed the radiological images as listed and agreed with the findings in the report. ECHOCARDIOGRAM COMPLETE  Result Date: 07/25/2021    ECHOCARDIOGRAM REPORT   Patient Name:   EZEQUIEL MACAULEY Date of Exam: 07/25/2021 Medical Rec #:  [id number]          Height:       74.0 in Accession #:    [id number]         Weight:       188.9 lb Date of Birth:  1981-06-13          BSA:          2.121 m Patient Age:    35 years           BP:           127/87 mmHg Patient Gender: M                  HR:           64 bpm. Exam Location:  Outpatient Procedure: 2D Echo, Cardiac Doppler, Color Doppler and Strain Analysis Indications:    Pre-Chemo  History:        Patient has no prior history of Echocardiogram examinations.                 Lymphoma.  Sonographer:    Merrie Roof RDCS Referring Phys: [id number] [place]  1. Left ventricular ejection fraction, by estimation, is 60 to 65%. The left ventricle has normal function. The left ventricle has no regional wall motion abnormalities. Left ventricular diastolic parameters were normal. The average left ventricular  global longitudinal strain is -21.4 %. The global longitudinal strain is normal.  2. Right ventricular systolic function is normal. The right ventricular size is normal.  3. ? Port/catheter seen in RA.  4. The mitral valve is normal in structure. No evidence of mitral valve regurgitation. No evidence of mitral stenosis.  5. The aortic valve is tricuspid. Aortic valve regurgitation is not visualized. No aortic stenosis is present.  6. The inferior vena cava is normal in size with greater than 50% respiratory variability, suggesting right atrial pressure of 3 mmHg. FINDINGS  Left Ventricle: Left ventricular ejection fraction, by estimation, is 60 to 65%. The left ventricle has normal function. The left ventricle has no regional wall motion abnormalities. The average left ventricular global longitudinal strain is -21.4 %. The global longitudinal strain  is normal. The left ventricular internal cavity size was normal in size. There is no left ventricular hypertrophy. Left ventricular diastolic parameters were normal. Right Ventricle: The right ventricular size is normal. No increase in right ventricular wall thickness. Right ventricular systolic function is normal. Left Atrium: Left atrial size was normal in size. Right Atrium: ? Port/catheter seen in RA. Right atrial size was normal in size. Pericardium: There is no evidence of pericardial effusion. Mitral Valve: The mitral valve is normal in structure. No evidence of mitral valve regurgitation. No evidence of mitral valve stenosis. Tricuspid Valve: The tricuspid valve is normal in structure. Tricuspid valve regurgitation is not demonstrated. No evidence of tricuspid stenosis. Aortic Valve: The aortic valve is tricuspid. Aortic valve regurgitation is not visualized. No aortic stenosis is present. Aortic valve mean gradient measures 4.0 mmHg. Aortic valve peak gradient measures 6.9 mmHg. Aortic valve area, by VTI measures 3.30 cm. Pulmonic Valve: The pulmonic valve was normal in structure. Pulmonic valve regurgitation is not visualized. No evidence of pulmonic stenosis. Aorta: The aortic root is normal in size and structure. Venous: The inferior vena cava is normal in size with greater than 50% respiratory variability, suggesting right atrial pressure of 3 mmHg. IAS/Shunts: No atrial level shunt detected by color flow Doppler.  LEFT VENTRICLE PLAX 2D LVIDd:         5.40 cm   Diastology LVIDs:         3.50 cm   LV e' medial:    13.70 cm/s LV PW:         0.90 cm   LV E/e' medial:  6.8 LV IVS:        0.70 cm   LV e' lateral:   17.40 cm/s LVOT diam:     2.20 cm   LV E/e' lateral: 5.4 LV SV:         82 LV SV Index:   39        2D Longitudinal Strain LVOT Area:     3.80 cm  2D Strain GLS Avg:     -21.4 %                           3D Volume EF:                          3D EF:        61 %                          LV EDV:        144 ml  LV ESV:       56 ml                          LV SV:        88 ml RIGHT VENTRICLE RV Basal diam:  4.10 cm RV Mid diam:    3.40 cm LEFT ATRIUM             Index        RIGHT ATRIUM           Index LA diam:        3.60 cm 1.70 cm/m   RA Area:     26.00 cm LA Vol (A2C):   58.4 ml 27.53 ml/m  RA Volume:   97.20 ml  45.82 ml/m LA Vol (A4C):   55.1 ml 25.97 ml/m LA Biplane Vol: 57.6 ml 27.15 ml/m  AORTIC VALVE AV Area (Vmax):    3.40 cm AV Area (Vmean):   3.17 cm AV Area (VTI):     3.30 cm AV Vmax:           131.00 cm/s AV Vmean:          87.300 cm/s AV VTI:            0.250 m AV Peak Grad:      6.9 mmHg AV Mean Grad:      4.0 mmHg LVOT Vmax:         117.00 cm/s LVOT Vmean:        72.700 cm/s LVOT VTI:          0.217 m LVOT/AV VTI ratio: 0.87  AORTA Ao Root diam: 3.30 cm Ao Asc diam:  3.00 cm MITRAL VALVE MV Area (PHT): 4.80 cm    SHUNTS MV Decel Time: 158 msec    Systemic VTI:  0.22 m MV E velocity: 93.70 cm/s  Systemic Diam: 2.20 cm MV A velocity: 53.70 cm/s MV E/A ratio:  1.74 Jenkins Rouge MD Electronically signed by Jenkins Rouge MD Signature Date/Time: 07/25/2021/1:26:41 PM    Final    IR IMAGING GUIDED PORT INSERTION  Result Date: 07/26/2021 INDICATION: History of lymphoma. In need of durable intravenous access for administration. EXAM: IMPLANTED PORT A CATH PLACEMENT WITH ULTRASOUND AND FLUOROSCOPIC GUIDANCE COMPARISON:  PET-CT-07/04/2021 MEDICATIONS: None ANESTHESIA/SEDATION: Moderate (conscious) sedation was employed during this procedure. A total of Versed 4 mg and Fentanyl 100 mcg was administered intravenously. Moderate Sedation Time: 28 minutes. The patient's level of consciousness and vital signs were monitored continuously by radiology nursing throughout the procedure under my direct supervision. CONTRAST:  None FLUOROSCOPY TIME:  31 seconds (1 mGy) COMPLICATIONS: None immediate. PROCEDURE: The procedure, risks, benefits, and alternatives were explained to  the patient. Questions regarding the procedure were encouraged and answered. The patient understands and consents to the procedure. Given the presence the known hypermetabolic right cervical lymphadenopathy, the decision was decision made to proceed with left internal jugular approach port a placement. The left neck and chest were prepped with chlorhexidine in a sterile fashion, and a sterile drape was applied covering the operative field. Maximum barrier sterile technique with sterile gowns and gloves were used for the procedure. A timeout was performed prior to the initiation of the procedure. Local anesthesia was provided with 1% lidocaine with epinephrine. After creating a small venotomy incision, a micropuncture kit was utilized to access the internal jugular vein. Real-time ultrasound guidance was utilized for vascular access including the acquisition of a permanent  ultrasound image documenting patency of the accessed vessel. The microwire was utilized to measure appropriate catheter length. A subcutaneous port pocket was then created along the upper chest wall utilizing a combination of sharp and blunt dissection. The pocket was irrigated with sterile saline. A single lumen slim sized power injectable port was chosen for placement. The 8 Fr catheter was tunneled from the port pocket site to the venotomy incision. The port was placed in the pocket. The external catheter was trimmed to appropriate length. At the venotomy, an 8 Fr peel-away sheath was placed over a guidewire under fluoroscopic guidance. The catheter was then placed through the sheath and the sheath was removed. Final catheter positioning was confirmed and documented with a fluoroscopic spot radiograph. The port was accessed with a Huber needle, aspirated and flushed with heparinized saline. The venotomy site was closed with an interrupted 4-0 Vicryl suture. The port pocket incision was closed with interrupted 2-0 Vicryl suture. Dermabond and  Steri-strips were applied to both incisions. Dressings were applied. The patient tolerated the procedure well without immediate post procedural complication. FINDINGS: After catheter placement, the tip lies within the superior cavoatrial junction. The catheter aspirates and flushes normally and is ready for immediate use. IMPRESSION: Successful placement of a left internal jugular approach power injectable Port-A-Cath. The catheter is ready for immediate use. Electronically Signed   By: Sandi Mariscal M.D.   On: 07/26/2021 13:27    ASSESSMENT & PLAN:   Very pleasant 41 year old gentleman with some chronic food related bowel irregularities with  1) Newly diagnosed likely stage IVA large B-cell lymphoma. Lymphoma FISH panel shows no evidence of c-Myc rearrangement ruling out double hit or triple hit lymphoma. Patient presented with extensive abdominal and retroperitoneal lymphadenopathy, right supraclavicular lymphadenopathy and lung nodules concerning for lymphoma involvement as well as concern for small bowel involvement. 2) pulmonary nodules concerning for lymphoma involvement 3) 4.4 cm small bowel thickening concerning for involvement by lymphoma.  Cannot rule out adenocarcinoma but less likely. 4) acute kidney injury.  Patient's creatinine had bumped to 1.6 but on repeat is back down to normal. Uric acid 8 5) Food intolerances including gluten and dairy avoidance.  PLAN -I discussed the patient's labs with him in detail.  -He has no prohibitive toxicities from his cycle 1 of R-CHOP chemotherapy.  No significant neuropathy.  Blood counts stable. -Lymphoma FISH panel was discussed in detail and shows no evidence of c-Myc rearrangement to suggest double hit or triple hit lymphoma. -We discussed his Ki-67 still remains high and this does appear to be a more aggressive lymphoma based on that.  In addition he has fairly bulky disease and so we shall need to take a more aggressive approach to  treatment. -I discussed that I would recommend switching to dose adjusted EPOCH-R chemotherapy. -Patient is agreeable to this but wants to do this from cycle 3 so he can get his work plan in order. -He would like to pursue R-CHOP for cycle 2 and then switch to dose adjusted EPOCH-R from cycle 3.  We decided to pursue this plan in keeping with the patient's wishes. -Patient notes that most of the abdominal symptoms have resolved.  And wonders if he had lymphoma for longer since he has had unexplained abdominal symptomatology for a while.  He wonders if this high-grade lymphoma could be a transformation event from a low-grade lymphoma.  We discussed that there was no clear findings suggesting this but it cannot be ruled out. -We shall plan to  repeat a PET CT scan after 3 cycles of chemotherapy and then after he completes 6 cycles of chemotherapy.  Follow-up  Please schedule cycle 2 of R-CHOP with port flush and labs as per orders (patient prefers to have his treatment cycle 2-day 1 on Friday, 08/18/2020 with regards to his work schedule, if possible) MD visit with cycle 2-day 12 with port flush and labs for toxicity check  No orders of the defined types were placed in this encounter.   All of the patients questions were answered with apparent satisfaction. The patient knows to call the clinic with any problems, questions or concerns.  . The total time spent in the appointment was 30 minutes for result review, toxicity check for chemoimmunotherapy, ordering R-CHOP chemotherapy, discussing switching to dose adjusted EPOCH-R chemotherapy from cycle 3 of treatment and coordination of care and documentation.     Sullivan Lone MD MS AAHIVMS [facility] Hematology/Oncology Physician [facility]   .[name]

## 2021-08-19 ENCOUNTER — Inpatient Hospital Stay: Payer: No Typology Code available for payment source

## 2021-08-19 ENCOUNTER — Other Ambulatory Visit: Payer: Self-pay

## 2021-08-19 VITALS — BP 109/67 | HR 85 | Temp 97.3°F | Resp 16

## 2021-08-19 DIAGNOSIS — Z5112 Encounter for antineoplastic immunotherapy: Secondary | ICD-10-CM | POA: Diagnosis not present

## 2021-08-19 DIAGNOSIS — C8338 Diffuse large B-cell lymphoma, lymph nodes of multiple sites: Secondary | ICD-10-CM

## 2021-08-19 MED ORDER — PEGFILGRASTIM-BMEZ 6 MG/0.6ML ~~LOC~~ SOSY
6.0000 mg | PREFILLED_SYRINGE | Freq: Once | SUBCUTANEOUS | Status: AC
  Administered 2021-08-19: 6 mg via SUBCUTANEOUS
  Filled 2021-08-19: qty 0.6

## 2021-08-19 NOTE — Patient Instructions (Signed)

## 2021-08-26 ENCOUNTER — Other Ambulatory Visit: Payer: Self-pay | Admitting: Hematology

## 2021-08-26 DIAGNOSIS — C8338 Diffuse large B-cell lymphoma, lymph nodes of multiple sites: Secondary | ICD-10-CM

## 2021-08-28 ENCOUNTER — Other Ambulatory Visit: Payer: Self-pay

## 2021-08-28 ENCOUNTER — Inpatient Hospital Stay: Payer: No Typology Code available for payment source

## 2021-08-28 ENCOUNTER — Inpatient Hospital Stay: Payer: No Typology Code available for payment source | Admitting: Hematology

## 2021-08-28 ENCOUNTER — Encounter: Payer: Self-pay | Admitting: Hematology

## 2021-08-28 VITALS — BP 98/66 | HR 93 | Temp 98.0°F | Resp 17 | Ht 74.0 in | Wt 174.3 lb

## 2021-08-28 DIAGNOSIS — C8338 Diffuse large B-cell lymphoma, lymph nodes of multiple sites: Secondary | ICD-10-CM

## 2021-08-28 DIAGNOSIS — Z95828 Presence of other vascular implants and grafts: Secondary | ICD-10-CM

## 2021-08-28 DIAGNOSIS — Z7189 Other specified counseling: Secondary | ICD-10-CM

## 2021-08-28 DIAGNOSIS — Z5112 Encounter for antineoplastic immunotherapy: Secondary | ICD-10-CM | POA: Diagnosis not present

## 2021-08-28 LAB — CBC WITH DIFFERENTIAL (CANCER CENTER ONLY)
Abs Immature Granulocytes: 3.9 10*3/uL — ABNORMAL HIGH (ref 0.00–0.07)
Basophils Absolute: 0.1 10*3/uL (ref 0.0–0.1)
Basophils Relative: 0 %
Eosinophils Absolute: 0 10*3/uL (ref 0.0–0.5)
Eosinophils Relative: 0 %
HCT: 33.9 % — ABNORMAL LOW (ref 39.0–52.0)
Hemoglobin: 10.9 g/dL — ABNORMAL LOW (ref 13.0–17.0)
Immature Granulocytes: 19 %
Lymphocytes Relative: 8 %
Lymphs Abs: 1.7 10*3/uL (ref 0.7–4.0)
MCH: 28.5 pg (ref 26.0–34.0)
MCHC: 32.2 g/dL (ref 30.0–36.0)
MCV: 88.5 fL (ref 80.0–100.0)
Monocytes Absolute: 1.6 10*3/uL — ABNORMAL HIGH (ref 0.1–1.0)
Monocytes Relative: 8 %
Neutro Abs: 13.6 10*3/uL — ABNORMAL HIGH (ref 1.7–7.7)
Neutrophils Relative %: 65 %
Platelet Count: 235 10*3/uL (ref 150–400)
RBC: 3.83 MIL/uL — ABNORMAL LOW (ref 4.22–5.81)
RDW: 15.9 % — ABNORMAL HIGH (ref 11.5–15.5)
WBC Count: 20.8 10*3/uL — ABNORMAL HIGH (ref 4.0–10.5)
nRBC: 0.2 % (ref 0.0–0.2)

## 2021-08-28 LAB — CMP (CANCER CENTER ONLY)
ALT: 10 U/L (ref 0–44)
AST: 10 U/L — ABNORMAL LOW (ref 15–41)
Albumin: 4 g/dL (ref 3.5–5.0)
Alkaline Phosphatase: 70 U/L (ref 38–126)
Anion gap: 6 (ref 5–15)
BUN: 8 mg/dL (ref 6–20)
CO2: 27 mmol/L (ref 22–32)
Calcium: 9.1 mg/dL (ref 8.9–10.3)
Chloride: 108 mmol/L (ref 98–111)
Creatinine: 0.82 mg/dL (ref 0.61–1.24)
GFR, Estimated: >60 mL/min (ref >60)
Glucose, Bld: 112 mg/dL — ABNORMAL HIGH (ref 70–99)
Potassium: 3.7 mmol/L (ref 3.5–5.1)
Sodium: 141 mmol/L (ref 135–145)
Total Bilirubin: 0.2 mg/dL — ABNORMAL LOW (ref 0.3–1.2)
Total Protein: 6.2 g/dL — ABNORMAL LOW (ref 6.5–8.1)

## 2021-08-28 LAB — LACTATE DEHYDROGENASE: LDH: 211 U/L — ABNORMAL HIGH (ref 98–192)

## 2021-08-28 MED ORDER — SODIUM CHLORIDE 0.9% FLUSH
10.0000 mL | Freq: Once | INTRAVENOUS | Status: AC
  Administered 2021-08-28: 10 mL

## 2021-08-28 MED ORDER — HEPARIN SOD (PORK) LOCK FLUSH 100 UNIT/ML IV SOLN
500.0000 [IU] | Freq: Once | INTRAVENOUS | Status: AC
  Administered 2021-08-28: 500 [IU]

## 2021-08-28 MED ORDER — ALLOPURINOL 100 MG PO TABS: 100.0000 mg | ORAL_TABLET | Freq: Two times a day (BID) | ORAL | 0 refills | Status: DC

## 2021-09-01 ENCOUNTER — Telehealth: Payer: Self-pay

## 2021-09-01 NOTE — Telephone Encounter (Signed)
Notified Patient of completion of FMLA paperwork. Fax transmission confirmation received and copy of paperwork placed at Registration Desk for pick-up as requested by Patient.

## 2021-09-03 ENCOUNTER — Encounter: Payer: Self-pay | Admitting: Hematology

## 2021-09-03 NOTE — Progress Notes (Addendum)
[name]   HEMATOLOGY/ONCOLOGY CLINIC NOTE  Date of Service: .08/28/2021   Patient Care Team: [name], MD as PCP - General (Family Medicine)  CHIEF COMPLAINTS/PURPOSE OF CONSULTATION:  Follow-up for toxicity check after cycle 2 of R-CHOP and discussion of further treatment for high-grade large B-cell lymphoma  HISTORY OF PRESENTING ILLNESS:   Please see previous notes for details on initial presentation  INTERVAL HISTORY  [name] is here for follow-up after completing his second cycle of R-CHOP chemotherapy for toxicity check and discussion of further treatment plan. He notes that he feels he tolerated his second cycle of chemotherapy better with some grade 1 fatigue. No other acute new prohibitive toxicities from cycle 2 of R-CHOP. No fevers no chills no night sweats. We discussed again in detail and he would like to proceed with daEPOCH-R with cycle 3 through cycle 6.  Orders were reviewed and placed with appropriate supportive medications. I discussed the logistics with my nurse Beth to help schedule this.  MEDICAL HISTORY:  Past Medical History:  Diagnosis Date   Allergy    Cancer ([place])    skin cancer   Herniated disc    Hx of skin cancer, basal cell    Hyperlipidemia    borderline- no meds     SURGICAL HISTORY: Past Surgical History:  Procedure Laterality Date   COLONOSCOPY     last 2013   ESOPHAGOGASTRODUODENOSCOPY (EGD) WITH PROPOFOL N/A 06/29/2021   Procedure: ESOPHAGOGASTRODUODENOSCOPY (EGD) WITH PROPOFOL;  Surgeon: Milus Banister, MD;  Location: [facility];  Service: Endoscopy;  Laterality: N/A;   EUS N/A 06/29/2021   Procedure: UPPER ENDOSCOPIC ULTRASOUND (EUS) RADIAL;  Surgeon: Milus Banister, MD;  Location: [facility];  Service: Endoscopy;  Laterality: N/A;   FINE NEEDLE ASPIRATION N/A 06/29/2021   Procedure: FINE NEEDLE ASPIRATION (FNA) LINEAR;  Surgeon: Milus Banister, MD;  Location: [facility];  Service: Endoscopy;   Laterality: N/A;   IR IMAGING GUIDED PORT INSERTION  07/26/2021   VASECTOMY      SOCIAL HISTORY: Social History   Socioeconomic History   Marital status: Married    Spouse name: Not on file   Number of children: 1   Years of education: Not on file   Highest education level: Not on file  Occupational History   Occupation: Sales   Tobacco Use   Smoking status: Never   Smokeless tobacco: Never  Substance and Sexual Activity   Alcohol use: Not Currently    Alcohol/week: 1.0 standard drink    Types: 1 Glasses of wine per week    Comment: Rare    Drug use: No   Sexual activity: Not on file  Other Topics Concern   Not on file  Social History Narrative   Daily caffeine    Social Determinants of Health   Financial Resource Strain: Not on file  Food Insecurity: Not on file  Transportation Needs: Not on file  Physical Activity: Not on file  Stress: Not on file  Social Connections: Not on file  Intimate Partner Violence: Not on file    FAMILY HISTORY: Family History  Problem Relation Age of Onset   Colon cancer Mother 74   Colon polyps Mother    Colon cancer Paternal Grandfather    Alzheimer's disease Paternal Grandfather    Breast cancer Maternal Aunt    Diabetes Maternal Grandmother    Prostate cancer Maternal Grandfather    Esophageal cancer Neg Hx    Rectal cancer Neg Hx  Stomach cancer Neg Hx     ALLERGIES:  is allergic to bee pollen-1000-royal jelly [nutritional supplements], gluten meal, and lactose intolerance (gi).  MEDICATIONS:  Current Outpatient Medications  Medication Sig Dispense Refill   Ascorbic Acid (VITA-C PO) Take 1 tablet by mouth 2 (two) times daily.     Cholecalciferol (VITAMIN D-3) 125 MCG (5000 UT) TABS Take 5,000 Units by mouth 2 (two) times daily.     HYDROcodone-acetaminophen (NORCO) 5-325 MG tablet Take 1-2 tablets by mouth every 6 (six) hours as needed for moderate pain. 50 tablet 0   ibuprofen (ADVIL) 200 MG tablet Take 400-600 mg  by mouth every 6 (six) hours as needed for mild pain or moderate pain.     lidocaine-prilocaine (EMLA) cream Apply to affected area once 30 g 3   LORazepam (ATIVAN) 0.5 MG tablet Take 1 tablet (0.5 mg total) by mouth every 6 (six) hours as needed (Nausea or vomiting). 30 tablet 0   magic mouthwash w/lidocaine SOLN Take 5 mLs by mouth 4 (four) times daily as needed for mouth pain. Rx: 50 mL viscous lidocaine 2% 50 mL Maalox 50 mL diphenhydramine (Benadryl) at 12.5 mg per 5 ml elixir 50 mL nystatin (100,000U per 5 mL) suspension 50 mL distilled water 250 mL 1   ondansetron (ZOFRAN) 8 MG tablet Take 1 tablet (8 mg total) by mouth 2 (two) times daily as needed for refractory nausea / vomiting. Start on day 3 after cyclophosphamide chemotherapy. 30 tablet 1   pantoprazole (PROTONIX) 40 MG tablet Take 1 tablet (40 mg total) by mouth daily before breakfast. 30 tablet 2   prochlorperazine (COMPAZINE) 10 MG tablet Take 1 tablet (10 mg total) by mouth every 6 (six) hours as needed (Nausea or vomiting). 30 tablet 6   No current facility-administered medications for this visit.    REVIEW OF SYSTEMS:   .10 Point review of Systems was done is negative except as noted above.   PHYSICAL EXAMINATION: ECOG PERFORMANCE STATUS: 1 - Symptomatic but completely ambulatory  . Vitals:   08/28/21 1006  BP: 98/66  Pulse: 93  Resp: 17  Temp: 98 F (36.7 C)  SpO2: 99%   Filed Weights   08/28/21 1006  Weight: 174 lb 4.8 oz (79.1 kg)   .Body mass index is 22.38 kg/m.  [name] GENERAL:alert, in no acute distress and comfortable SKIN: no acute rashes, no significant lesions EYES: conjunctiva are pink and non-injected, sclera anicteric OROPHARYNX: MMM, no exudates, no oropharyngeal erythema or ulceration NECK: supple, no JVD LYMPH:  no palpable lymphadenopathy in the cervical, axillary or inguinal regions LUNGS: clear to auscultation b/l with normal respiratory effort HEART: regular rate & rhythm ABDOMEN:   normoactive bowel sounds , non tender, not distended. Extremity: no pedal edema PSYCH: alert & oriented x 3 with fluent speech NEURO: no focal motor/sensory deficits    LABORATORY DATA:   I have reviewed the data as listed  CBC Latest Ref Rng & Units 08/28/2021 08/17/2021 08/10/2021  WBC 4.0 - 10.5 K/uL 20.8(H) 7.8 12.7(H)  Hemoglobin 13.0 - 17.0 g/dL 10.9(L) 12.8(L) 12.7(L)  Hematocrit 39.0 - 52.0 % 33.9(L) 38.9(L) 37.6(L)  Platelets 150 - 400 K/uL 235 273 221    . CMP Latest Ref Rng & Units 08/28/2021 08/17/2021 08/10/2021  Glucose 70 - 99 mg/dL 112(H) 146(H) 108(H)  BUN 6 - 20 mg/dL 8 12 8   Creatinine 0.61 - 1.24 mg/dL 0.82 0.83 0.87  Sodium 135 - 145 mmol/L 141 138 137  Potassium 3.5 -  5.1 mmol/L 3.7 4.1 4.3  Chloride 98 - 111 mmol/L 108 104 104  CO2 22 - 32 mmol/L 27 25 25   Calcium 8.9 - 10.3 mg/dL 9.1 9.7 9.6  Total Protein 6.5 - 8.1 g/dL 6.2(L) 6.9 6.8  Total Bilirubin 0.3 - 1.2 mg/dL 0.2(L) 0.3 0.3  Alkaline Phos 38 - 126 U/L 70 57 67  AST 15 - 41 U/L 10(L) 13(L) 16  ALT 0 - 44 U/L 10 12 21    Component     Latest Ref Rng & Units 07/11/2021  Color, Urine     YELLOW YELLOW  Appearance     CLEAR CLEAR  Specific Gravity, Urine     1.005 - 1.030 1.016  pH     5.0 - 8.0 5.0  Glucose, UA     NEGATIVE mg/dL NEGATIVE  Hgb urine dipstick     NEGATIVE NEGATIVE  Bilirubin Urine     NEGATIVE NEGATIVE  Ketones, ur     NEGATIVE mg/dL 20 (A)  Protein     NEGATIVE mg/dL NEGATIVE  Nitrite     NEGATIVE NEGATIVE  Leukocytes,Ua     NEGATIVE NEGATIVE  HIV Screen 4th Generation wRfx     Non Reactive Non Reactive  Hep B Core Total Ab     NON REACTIVE NON REACTIVE  Hepatitis B Surface Ag     NON REACTIVE NON REACTIVE  HCV Ab     NON REACTIVE NON REACTIVE  Uric Acid, Serum     3.7 - 8.6 mg/dL 8.0  LDH     98 - 192 U/L 300 (H)   SURGICAL PATHOLOGY  CASE: [id number]  PATIENT: [name]  Surgical Pathology Report      Specimen Submitted:  A. Lymph  node, right neck   Clinical History: History of malignant non Hodgkin's lymphoma after EUS  biopsy on 11/17, however sample was insufficient to subclassify for  treatment planning.  Now post US guided bx of hypermetabolic right  supraclavicular lymph node       DIAGNOSIS:  A. LYMPH NODE, RIGHT NECK; ULTRASOUND-GUIDED BIOPSY:  - LARGE B-CELL LYMPHOMA.  - SEE COMMENT.   Comment:  Sections demonstrate sheets of large, markedly atypical cells with  prominent macronucleoli.  Mitotic activity is brisk.  Flow cytometry  demonstrates a CD5 negative, CD10 negative monoclonal B cell population.  Definitive classification is dependent on prognostically significant IHC  and FISH studies, which will be reported in an addendum.      RADIOGRAPHIC STUDIES: I have personally reviewed the radiological images as listed and agreed with the findings in the report. No results found.  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 Lymphoma FISH panel shows no evidence of c-Myc rearrangement ruling out double hit or triple hit lymphoma. Patient presented with extensive abdominal and retroperitoneal lymphadenopathy, right supraclavicular lymphadenopathy and lung nodules concerning for lymphoma involvement as well as concern for small bowel involvement. 2) pulmonary nodules concerning for lymphoma involvement 3) 4.4 cm small bowel thickening concerning for involvement by lymphoma.  Cannot rule out adenocarcinoma but less likely. 4) acute kidney injury.  Patient's creatinine had bumped to 1.6 but on repeat is back down to normal. 5) Food intolerances including gluten and dairy avoidance.  PLAN -we discussed lab results from today as noted above. -Patient reporting no prohibitive toxicities from cycle 2 of R-CHOP chemotherapy. -Dizziness high-grade large B-cell lymphoma though  this is not a double hit we discussed switching to da EPOCH-R from cycle 3 of treatment. -Orders have been placed with appropriate supportive medications he shall be getting infusional [facility] 09/11/2021 in the hospital for 5 days. -Rituxan and Ellen Henri will be on day 8 as outpatient. -We shall plan to repeat a PET CT scan after 3 cycles of chemotherapy and then after he completes 6 cycles of chemotherapy.  Follow-up  We will schedule for inpatient dose adjusted EPOCH-R from cycle 3 starting on 09/11/2021 PET/CT prior to cycle 4 in 3 weeks   All of the patients questions were answered with apparent satisfaction. The patient knows to call the clinic with any problems, questions or concerns.  . The total time spent in the appointment was 30 minutes for toxicity review, lab review, ordering coordinating care for starting dose adjusted EPOCH-R for next cycle documentation.   Sullivan Lone MD MS AAHIVMS [facility] Hematology/Oncology Physician [facility]   .Marland KitchenMarland KitchenMarland Kitchen

## 2021-09-04 ENCOUNTER — Encounter: Payer: Self-pay | Admitting: Hematology

## 2021-09-04 DIAGNOSIS — Z7189 Other specified counseling: Secondary | ICD-10-CM | POA: Insufficient documentation

## 2021-09-04 NOTE — Addendum Note (Signed)
Addended by: Sullivan Lone on: 09/04/2021 01:00 AM   Modules accepted: Orders

## 2021-09-05 NOTE — Progress Notes (Signed)
Bed control contacted and given dates for pt's admission ( 09/11/21 - 09/15/21) Pt notified. Email sent to #[email or web address] per process.

## 2021-09-06 ENCOUNTER — Other Ambulatory Visit: Payer: Self-pay | Admitting: Hematology

## 2021-09-06 ENCOUNTER — Encounter: Payer: Self-pay | Admitting: Hematology

## 2021-09-06 DIAGNOSIS — C8338 Diffuse large B-cell lymphoma, lymph nodes of multiple sites: Secondary | ICD-10-CM

## 2021-09-06 MED ORDER — LORAZEPAM 0.5 MG PO TABS: 0.5000 mg | ORAL_TABLET | Freq: Four times a day (QID) | ORAL | 0 refills | Status: DC | PRN

## 2021-09-06 NOTE — Addendum Note (Signed)
Addended by: Tora Kindred on: 09/06/2021 09:40 AM   Modules accepted: Orders

## 2021-09-11 ENCOUNTER — Encounter (HOSPITAL_COMMUNITY): Payer: Self-pay | Admitting: Hematology

## 2021-09-11 ENCOUNTER — Inpatient Hospital Stay (HOSPITAL_COMMUNITY)
Admission: AD | Admit: 2021-09-11 | Discharge: 2021-09-15 | DRG: 847 | Disposition: A | Discharge status: Home / Self Care | Payer: No Typology Code available for payment source | Source: Ambulatory Visit | Attending: Hematology | Admitting: Hematology

## 2021-09-11 ENCOUNTER — Other Ambulatory Visit: Payer: Self-pay

## 2021-09-11 DIAGNOSIS — E739 Lactose intolerance, unspecified: Secondary | ICD-10-CM | POA: Diagnosis present

## 2021-09-11 DIAGNOSIS — C8580 Other specified types of non-Hodgkin lymphoma, unspecified site: Secondary | ICD-10-CM | POA: Diagnosis not present

## 2021-09-11 DIAGNOSIS — Z8371 Family history of colonic polyps: Secondary | ICD-10-CM

## 2021-09-11 DIAGNOSIS — Z803 Family history of malignant neoplasm of breast: Secondary | ICD-10-CM

## 2021-09-11 DIAGNOSIS — Z833 Family history of diabetes mellitus: Secondary | ICD-10-CM

## 2021-09-11 DIAGNOSIS — Z820 Family history of epilepsy and other diseases of the nervous system: Secondary | ICD-10-CM

## 2021-09-11 DIAGNOSIS — Z85828 Personal history of other malignant neoplasm of skin: Secondary | ICD-10-CM

## 2021-09-11 DIAGNOSIS — Z5111 Encounter for antineoplastic chemotherapy: Principal | ICD-10-CM

## 2021-09-11 DIAGNOSIS — C8338 Diffuse large B-cell lymphoma, lymph nodes of multiple sites: Secondary | ICD-10-CM | POA: Diagnosis present

## 2021-09-11 DIAGNOSIS — Z79899 Other long term (current) drug therapy: Secondary | ICD-10-CM | POA: Diagnosis not present

## 2021-09-11 DIAGNOSIS — Z800 Family history of malignant neoplasm of digestive organs: Secondary | ICD-10-CM | POA: Diagnosis not present

## 2021-09-11 DIAGNOSIS — Z20822 Contact with and (suspected) exposure to covid-19: Secondary | ICD-10-CM | POA: Diagnosis present

## 2021-09-11 DIAGNOSIS — E785 Hyperlipidemia, unspecified: Secondary | ICD-10-CM | POA: Diagnosis present

## 2021-09-11 DIAGNOSIS — Z8042 Family history of malignant neoplasm of prostate: Secondary | ICD-10-CM | POA: Diagnosis not present

## 2021-09-11 DIAGNOSIS — Z91030 Bee allergy status: Secondary | ICD-10-CM

## 2021-09-11 DIAGNOSIS — E876 Hypokalemia: Secondary | ICD-10-CM | POA: Diagnosis not present

## 2021-09-11 DIAGNOSIS — Z7189 Other specified counseling: Secondary | ICD-10-CM

## 2021-09-11 LAB — RESP PANEL BY RT-PCR (FLU A&B, COVID) ARPGX2
Influenza A by PCR: NEGATIVE
Influenza B by PCR: NEGATIVE
SARS Coronavirus 2 by RT PCR: NEGATIVE

## 2021-09-11 MED ORDER — SENNOSIDES-DOCUSATE SODIUM 8.6-50 MG PO TABS: 1.0000 | ORAL_TABLET | Freq: Every evening | ORAL | Status: DC | PRN

## 2021-09-11 MED ORDER — ACETAMINOPHEN 325 MG PO TABS: 650.0000 mg | ORAL_TABLET | ORAL | Status: DC | PRN

## 2021-09-11 MED ORDER — PREDNISONE 50 MG PO TABS
120.0000 mg | ORAL_TABLET | Freq: Every day | ORAL | Status: AC
  Administered 2021-09-11 – 2021-09-15 (×5): 120 mg via ORAL
  Filled 2021-09-11 (×5): qty 1

## 2021-09-11 MED ORDER — PANTOPRAZOLE SODIUM 40 MG PO TBEC
40.0000 mg | DELAYED_RELEASE_TABLET | Freq: Every day | ORAL | Status: DC
  Administered 2021-09-12 – 2021-09-15 (×4): 40 mg via ORAL
  Filled 2021-09-11 (×4): qty 1

## 2021-09-11 MED ORDER — HYDROCODONE-ACETAMINOPHEN 5-325 MG PO TABS: 1.0000 | ORAL_TABLET | Freq: Four times a day (QID) | ORAL | Status: DC | PRN

## 2021-09-11 MED ORDER — ENOXAPARIN SODIUM 40 MG/0.4ML IJ SOSY
40.0000 mg | PREFILLED_SYRINGE | INTRAMUSCULAR | Status: DC
  Administered 2021-09-11: 40 mg via SUBCUTANEOUS
  Filled 2021-09-11 (×3): qty 0.4

## 2021-09-11 MED ORDER — SODIUM CHLORIDE 0.9 % IV SOLN
Freq: Once | INTRAVENOUS | Status: AC
  Administered 2021-09-11: 18 mg via INTRAVENOUS
  Filled 2021-09-11: qty 4

## 2021-09-11 MED ORDER — LORAZEPAM 0.5 MG PO TABS
0.5000 mg | ORAL_TABLET | Freq: Four times a day (QID) | ORAL | Status: DC | PRN
  Administered 2021-09-11 – 2021-09-15 (×10): 0.5 mg via ORAL
  Filled 2021-09-11 (×10): qty 1

## 2021-09-11 MED ORDER — VINCRISTINE SULFATE CHEMO INJECTION 1 MG/ML
Freq: Once | INTRAVENOUS | Status: AC
  Filled 2021-09-11: qty 10

## 2021-09-11 NOTE — Progress Notes (Signed)
Admitted for start of C3 chemo with new regimen.  Per MD draw labs but start chemo with late admission.  MD will review labs once resulted.

## 2021-09-11 NOTE — H&P (Addendum)
[facility]  [phone] [phone]   MEDICAL ONCOLOGY - ADMISSION H&P  Reason for Admission: Diffuse large B-cell lymphoma, chemotherapy administration  HPI: Allen Hodge is a 41 year old male with diffuse large B-cell lymphoma.  He initially received 2 cycles of R-CHOP and has tolerated well.  Although patient does not have double hit lymphoma, recommendation was made to switch to [facility] beginning with cycle #3.  The patient reports that he is feeling well today.  He is not having any fevers, chills, mucositis, chest pain, shortness of breath, abdominal pain, nausea, vomiting, constipation, diarrhea.  Bowels moved today.  The patient presents to the hospital today for admission for cycle #3 of chemotherapy-switching to EPOCH-R from R-CHOP.  Past Medical History:  Diagnosis Date   Allergy    Cancer ([place])    skin cancer   Herniated disc    Hx of skin cancer, basal cell    Hyperlipidemia    borderline- no meds   :   Past Surgical History:  Procedure Laterality Date   COLONOSCOPY     last 2013   ESOPHAGOGASTRODUODENOSCOPY (EGD) WITH PROPOFOL N/A 06/29/2021   Procedure: ESOPHAGOGASTRODUODENOSCOPY (EGD) WITH PROPOFOL;  Surgeon: Milus Banister, MD;  [facility];  Service: Endoscopy;  Laterality: N/A;   EUS N/A 06/29/2021   Procedure: UPPER ENDOSCOPIC ULTRASOUND (EUS) RADIAL;  Surgeon: Milus Banister, MD;  [facility];  Service: Endoscopy;  Laterality: N/A;   FINE NEEDLE ASPIRATION N/A 06/29/2021   Procedure: FINE NEEDLE ASPIRATION (FNA) LINEAR;  Surgeon: Milus Banister, MD;  [facility];  Service: Endoscopy;  Laterality: N/A;   IR IMAGING GUIDED PORT INSERTION  07/26/2021   VASECTOMY    :   Current Facility-Administered Medications  Medication Dose Route Frequency Provider Last Rate Last Admin   acetaminophen (TYLENOL) tablet 650 mg  650 mg Oral Q4H PRN Brunetta Genera, MD       DOXOrubicin  (ADRIAMYCIN) 20 mg, etoposide (VEPESID) 102 mg, vinCRIStine (ONCOVIN) 0.8 mg in sodium chloride 0.9 % 1,000 mL chemo infusion   Intravenous Once Brunetta Genera, MD       enoxaparin (LOVENOX) injection 40 mg  40 mg Subcutaneous Q24H Brunetta Genera, MD       HYDROcodone-acetaminophen (NORCO/VICODIN) 5-325 MG per tablet 1-2 tablet  1-2 tablet Oral Q6H PRN Brunetta Genera, MD       LORazepam (ATIVAN) tablet 0.5 mg  0.5 mg Oral Q6H PRN Brunetta Genera, MD       ondansetron (ZOFRAN) 8 mg, dexamethasone (DECADRON) 10 mg in sodium chloride 0.9 % 50 mL IVPB   Intravenous Once Brunetta Genera, MD       Derrill Memo ON 09/12/2021] pantoprazole (PROTONIX) EC tablet 40 mg  40 mg Oral QAC breakfast Brunetta Genera, MD       predniSONE (DELTASONE) tablet 120 mg  120 mg Oral QAC breakfast Brunetta Genera, MD       senna-docusate (Senokot-S) tablet 1 tablet  1 tablet Oral QHS PRN Brunetta Genera, MD          Allergies  Allergen Reactions   Bee Pollen-1000-Royal Jelly [Nutritional Supplements] Anaphylaxis    And Bee's Wax - inflammatory reaction per pt    Gluten Meal Other (See Comments)    Abdominal cramping    Lactose Intolerance (Gi) Other (See Comments)    Abdominal cramping  :   Family History  Problem Relation Age of Onset   Colon cancer  Mother 51   Colon polyps Mother    Colon cancer Paternal Grandfather    Alzheimer's disease Paternal Grandfather    Breast cancer Maternal Aunt    Diabetes Maternal Grandmother    Prostate cancer Maternal Grandfather    Esophageal cancer Neg Hx    Rectal cancer Neg Hx    Stomach cancer Neg Hx   :   Social History   Socioeconomic History   Marital status: Married    Spouse name: Not on file   Number of children: 1   Years of education: Not on file   Highest education level: Not on file  Occupational History   Occupation: Sales   Tobacco Use   Smoking status: Never   Smokeless tobacco: Never  Substance and  Sexual Activity   Alcohol use: Not Currently    Alcohol/week: 1.0 standard drink    Types: 1 Glasses of wine per week    Comment: Rare    Drug use: No   Sexual activity: Not on file  Other Topics Concern   Not on file  Social History Narrative   Daily caffeine    Social Determinants of Health   Financial Resource Strain: Not on file  Food Insecurity: Not on file  Transportation Needs: Not on file  Physical Activity: Not on file  Stress: Not on file  Social Connections: Not on file  Intimate Partner Violence: Not on file  :  Review of Systems: A comprehensive 14 point review of systems was negative except as noted in the HPI.  Exam: Patient Vitals for the past 24 hrs:  BP Temp Temp src Pulse Resp SpO2  09/11/21 1343 106/71 97.9 F (36.6 C) Oral 83 16 100 %    General:  well-nourished in no acute distress.   Eyes:  no scleral icterus.   ENT:  There were no oropharyngeal lesions.   Lymphatics:  Negative cervical, supraclavicular or axillary adenopathy.   Respiratory: lungs were clear bilaterally without wheezing or crackles.   Cardiovascular:  Regular rate and rhythm, S1/S2, without murmur, rub or gallop.  There was no pedal edema.   GI:  abdomen was soft, flat, nontender, nondistended, without organomegaly.   Skin exam was without echymosis, petichae.   Neuro exam was nonfocal. Patient was alert and oriented.  Attention was good.   Language was appropriate.  Mood was normal without depression.  Speech was not pressured.  Thought content was not tangential.     Lab Results  Component Value Date   WBC 20.8 (H) 08/28/2021   HGB 10.9 (L) 08/28/2021   HCT 33.9 (L) 08/28/2021   PLT 235 08/28/2021   GLUCOSE 112 (H) 08/28/2021   ALT 10 08/28/2021   AST 10 (L) 08/28/2021   NA 141 08/28/2021   K 3.7 08/28/2021   CL 108 08/28/2021   CREATININE 0.82 08/28/2021   BUN 8 08/28/2021   CO2 27 08/28/2021    No results found.   No results found.  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 Lymphoma FISH panel shows no evidence of c-Myc rearrangement ruling out double hit or triple hit lymphoma. Patient presented with extensive abdominal and retroperitoneal lymphadenopathy, right supraclavicular lymphadenopathy and lung nodules concerning for lymphoma involvement as well as concern for small bowel involvement. 2) pulmonary nodules concerning for lymphoma involvement 3) 4.4 cm small bowel thickening concerning for involvement by lymphoma.  Cannot rule out adenocarcinoma but less likely. 4) acute kidney injury.  Patient's creatinine had bumped to 1.6 but on repeat is back down to normal. 5) Food intolerances including gluten and dairy avoidance.   PLAN -Baseline labs have been drawn and are pending.  We will review before proceeding with chemotherapy. -We will plan to proceed with day one of his chemotherapy today after labs are reviewed. -Reviewed toxicities associated with this chemotherapy regimen and he agrees to proceed. -Begin Lovenox for DVT prophylaxis. -Home medications including as needed Tylenol, as needed hydrocodone, and as needed Ativan have been ordered. -Begin Protonix for EPI prophylaxis -Senokot as ordered for constipation -Rituxan and Udenyca will be on day 8 as outpatient-office to arrange these appointments -We shall plan to repeat a PET CT scan after 3 cycles of chemotherapy and then after he completes 6 cycles of chemotherapy.   Mikey Bussing, DNP, AGPCNP-BC, AOCNP    ADDENDUM  .Patient was Personally and independently interviewed, examined and relevant elements of the history of present illness were reviewed in details and an assessment and plan was created. All elements of the patient's history of present illness , assessment and plan were discussed in details with ristin Curcio, DNP, AGPCNP-BC, AOCNP  . The above documentation reflects our combined findings assessment and plan.  -Labs today -Patient has no prohibitive toxicities from his second cycle of R-CHOP chemotherapy and is appropriate to proceed with cycle 3 of chemotherapy with dose adjusted EPOCH-R -We will be getting Rituxan and Udenyca as outpatient on Monday, 09/18/2021 -PET CT scan has been scheduled for 09/21/2021. -Admission orders placed.  Chemotherapy reviewed and signed  Sullivan Lone MD MS

## 2021-09-12 LAB — COMPREHENSIVE METABOLIC PANEL
ALT: 12 U/L (ref 0–44)
AST: 16 U/L (ref 15–41)
Albumin: 3.8 g/dL (ref 3.5–5.0)
Alkaline Phosphatase: 45 U/L (ref 38–126)
Anion gap: 8 (ref 5–15)
BUN: 12 mg/dL (ref 6–20)
CO2: 23 mmol/L (ref 22–32)
Calcium: 9.2 mg/dL (ref 8.9–10.3)
Chloride: 107 mmol/L (ref 98–111)
Creatinine, Ser: 0.7 mg/dL (ref 0.61–1.24)
GFR, Estimated: >60 mL/min (ref >60)
Glucose, Bld: 144 mg/dL — ABNORMAL HIGH (ref 70–99)
Potassium: 4.1 mmol/L (ref 3.5–5.1)
Sodium: 138 mmol/L (ref 135–145)
Total Bilirubin: 0.3 mg/dL (ref 0.3–1.2)
Total Protein: 6.3 g/dL — ABNORMAL LOW (ref 6.5–8.1)

## 2021-09-12 LAB — CBC WITH DIFFERENTIAL/PLATELET
Abs Immature Granulocytes: 0.07 10*3/uL (ref 0.00–0.07)
Basophils Absolute: 0 10*3/uL (ref 0.0–0.1)
Basophils Relative: 0 %
Eosinophils Absolute: 0 10*3/uL (ref 0.0–0.5)
Eosinophils Relative: 0 %
HCT: 34.3 % — ABNORMAL LOW (ref 39.0–52.0)
Hemoglobin: 11.5 g/dL — ABNORMAL LOW (ref 13.0–17.0)
Immature Granulocytes: 1 %
Lymphocytes Relative: 7 %
Lymphs Abs: 0.7 10*3/uL (ref 0.7–4.0)
MCH: 30.2 pg (ref 26.0–34.0)
MCHC: 33.5 g/dL (ref 30.0–36.0)
MCV: 90 fL (ref 80.0–100.0)
Monocytes Absolute: 0.1 10*3/uL (ref 0.1–1.0)
Monocytes Relative: 1 %
Neutro Abs: 9.7 10*3/uL — ABNORMAL HIGH (ref 1.7–7.7)
Neutrophils Relative %: 91 %
Platelets: 318 10*3/uL (ref 150–400)
RBC: 3.81 MIL/uL — ABNORMAL LOW (ref 4.22–5.81)
RDW: 17.3 % — ABNORMAL HIGH (ref 11.5–15.5)
WBC: 10.6 10*3/uL — ABNORMAL HIGH (ref 4.0–10.5)
nRBC: 0 % (ref 0.0–0.2)

## 2021-09-12 MED ORDER — SODIUM CHLORIDE 0.9 % IV SOLN
Freq: Once | INTRAVENOUS | Status: AC
  Administered 2021-09-12: 18 mg via INTRAVENOUS
  Filled 2021-09-12: qty 4

## 2021-09-12 MED ORDER — VINCRISTINE SULFATE CHEMO INJECTION 1 MG/ML
Freq: Once | INTRAVENOUS | Status: AC
  Filled 2021-09-12: qty 10

## 2021-09-12 MED ORDER — CHLORHEXIDINE GLUCONATE CLOTH 2 % EX PADS
6.0000 | MEDICATED_PAD | Freq: Every day | CUTANEOUS | Status: DC
  Administered 2021-09-12 – 2021-09-15 (×4): 6 via TOPICAL

## 2021-09-12 MED ORDER — PROCHLORPERAZINE MALEATE 10 MG PO TABS
10.0000 mg | ORAL_TABLET | Freq: Four times a day (QID) | ORAL | Status: DC | PRN
  Administered 2021-09-12 – 2021-09-14 (×2): 10 mg via ORAL
  Filled 2021-09-12 (×2): qty 1

## 2021-09-12 NOTE — Progress Notes (Signed)
Oncology Discharge Planning Admission Note  [facility] Address: [address], [place], [place] [address] Hours of Operation:  8am - 5pm, Monday - Friday  Clinic Contact Information:  [phone]) [phone]  Oncology Care Team: Medical Oncologist:  Dr. Irene Limbo  Dr. Irene Limbo is aware of this hospital admission dated 09/11/21 and has assessed patient at bedside.  The cancer center will follow [facility] Stagers inpatient care to assist with discharge planning as indicated by the oncologist.    Disclaimer:  This [facility] note does not imply a formal consult request has been made by the admitting attending for this admission or there will be an inpatient consult completed by oncology.  Please request oncology consults as per standard process as indicated.

## 2021-09-12 NOTE — Progress Notes (Signed)
°  Transition of Care [facility]) Screening Note   Patient Details  Name: Allen Hodge Date of Birth: [birth year]   Transition of Care [facility]) CM/SW Contact:    Yohana Bartha, Marjie Skiff, RN Phone Number: 09/12/2021, 2:17 PM    Transition of Care Department [facility]) has reviewed patient and no TOC needs have been identified at this time. We will continue to monitor patient advancement through interdisciplinary progression rounds. If new patient transition needs arise, please place a TOC consult.

## 2021-09-13 ENCOUNTER — Encounter: Payer: Self-pay | Admitting: Hematology

## 2021-09-13 LAB — COMPREHENSIVE METABOLIC PANEL
ALT: 11 U/L (ref 0–44)
AST: 13 U/L — ABNORMAL LOW (ref 15–41)
Albumin: 3.3 g/dL — ABNORMAL LOW (ref 3.5–5.0)
Alkaline Phosphatase: 36 U/L — ABNORMAL LOW (ref 38–126)
Anion gap: 7 (ref 5–15)
BUN: 14 mg/dL (ref 6–20)
CO2: 23 mmol/L (ref 22–32)
Calcium: 8.9 mg/dL (ref 8.9–10.3)
Chloride: 110 mmol/L (ref 98–111)
Creatinine, Ser: 0.67 mg/dL (ref 0.61–1.24)
GFR, Estimated: >60 mL/min (ref >60)
Glucose, Bld: 117 mg/dL — ABNORMAL HIGH (ref 70–99)
Potassium: 3.5 mmol/L (ref 3.5–5.1)
Sodium: 140 mmol/L (ref 135–145)
Total Bilirubin: 0.5 mg/dL (ref 0.3–1.2)
Total Protein: 5.5 g/dL — ABNORMAL LOW (ref 6.5–8.1)

## 2021-09-13 LAB — CBC WITH DIFFERENTIAL/PLATELET
Abs Immature Granulocytes: 0.05 10*3/uL (ref 0.00–0.07)
Basophils Absolute: 0 10*3/uL (ref 0.0–0.1)
Basophils Relative: 0 %
Eosinophils Absolute: 0 10*3/uL (ref 0.0–0.5)
Eosinophils Relative: 0 %
HCT: 33.2 % — ABNORMAL LOW (ref 39.0–52.0)
Hemoglobin: 10.8 g/dL — ABNORMAL LOW (ref 13.0–17.0)
Immature Granulocytes: 1 %
Lymphocytes Relative: 10 %
Lymphs Abs: 1 10*3/uL (ref 0.7–4.0)
MCH: 29.5 pg (ref 26.0–34.0)
MCHC: 32.5 g/dL (ref 30.0–36.0)
MCV: 90.7 fL (ref 80.0–100.0)
Monocytes Absolute: 1.2 10*3/uL — ABNORMAL HIGH (ref 0.1–1.0)
Monocytes Relative: 11 %
Neutro Abs: 8.6 10*3/uL — ABNORMAL HIGH (ref 1.7–7.7)
Neutrophils Relative %: 78 %
Platelets: 309 10*3/uL (ref 150–400)
RBC: 3.66 MIL/uL — ABNORMAL LOW (ref 4.22–5.81)
RDW: 17.6 % — ABNORMAL HIGH (ref 11.5–15.5)
WBC: 10.9 10*3/uL — ABNORMAL HIGH (ref 4.0–10.5)
nRBC: 0 % (ref 0.0–0.2)

## 2021-09-13 MED ORDER — SODIUM CHLORIDE 0.9 % IV SOLN
Freq: Once | INTRAVENOUS | Status: AC
  Administered 2021-09-13: 18 mg via INTRAVENOUS
  Filled 2021-09-13: qty 4

## 2021-09-13 MED ORDER — VINCRISTINE SULFATE CHEMO INJECTION 1 MG/ML
Freq: Once | INTRAVENOUS | Status: AC
  Filled 2021-09-13: qty 10

## 2021-09-13 NOTE — Progress Notes (Signed)
HEMATOLOGY/ONCOLOGY INPATIENT PROGRESS NOTE  Date of Service: 09/12/2020  Inpatient Attending: .Allen Genera, MD   SUBJECTIVE  [name] was seen in oncologic follow-up on cycle 3-day 2 of his dose adjusted EPOCH-R chemotherapy.  He notes no acute symptoms after his first day of treatment.  Uncontrolled nausea or vomiting.  No diarrhea. Acute new symptoms overnight.  Was able to eat and sleep okay. Labs were discussed in details and are stable.  OBJECTIVE:  NAD  PHYSICAL EXAMINATION: . Vitals:   09/12/21 1232 09/12/21 1403 09/12/21 2011 09/13/21 0445  BP:  106/63 105/68 100/63  Pulse:  95 89 65  Resp:   20 20  Temp:  98.1 F (36.7 C) 98 F (36.7 C) 98.2 F (36.8 C)  TempSrc:  Oral Oral Oral  SpO2: 96% 95% 97% 96%  Weight:       Filed Weights   09/11/21 1343  Weight: 178 lb 12.8 oz (81.1 kg)   .Body mass index is 22.96 kg/m.  GENERAL:alert, in no acute distress and comfortable SKIN: skin color, texture, turgor are normal, no rashes or significant lesions EYES: normal, conjunctiva are pink and non-injected, sclera clear OROPHARYNX:no exudate, no erythema and lips, buccal mucosa, and tongue normal  NECK: supple, no JVD, thyroid normal size, non-tender, without nodularity LYMPH:  no palpable lymphadenopathy in the cervical, axillary or inguinal LUNGS: clear to auscultation with normal respiratory effort HEART: regular rate & rhythm,  no murmurs and no lower extremity edema ABDOMEN: abdomen soft, non-tender, normoactive bowel sounds  Musculoskeletal: no cyanosis of digits and no clubbing  PSYCH: alert & oriented x 3 with fluent speech NEURO: no focal motor/sensory deficits  MEDICAL HISTORY:  Past Medical History:  Diagnosis Date   Allergy    Cancer ([place])    skin cancer   Herniated disc    Hx of skin cancer, basal cell    Hyperlipidemia    borderline- no meds     SURGICAL HISTORY: Past Surgical History:  Procedure Laterality Date    COLONOSCOPY     last 2013   ESOPHAGOGASTRODUODENOSCOPY (EGD) WITH PROPOFOL N/A 06/29/2021   Procedure: ESOPHAGOGASTRODUODENOSCOPY (EGD) WITH PROPOFOL;  Surgeon: Allen Banister, MD;  [facility];  Service: Endoscopy;  Laterality: N/A;   EUS N/A 06/29/2021   Procedure: UPPER ENDOSCOPIC ULTRASOUND (EUS) RADIAL;  Surgeon: Allen Banister, MD;  Location[facility];  Service: Endoscopy;  Laterality: N/A;   FINE NEEDLE ASPIRATION N/A 06/29/2021   Procedure: FINE NEEDLE ASPIRATION (FNA) LINEAR;  Surgeon: Allen Banister, MD;  [facility];  Service: Endoscopy;  Laterality: N/A;   IR IMAGING GUIDED PORT INSERTION  07/26/2021   VASECTOMY      SOCIAL HISTORY: Social History   Socioeconomic History   Marital status: Married    Spouse name: Not on file   Number of children: 1   Years of education: Not on file   Highest education level: Not on file  Occupational History   Occupation: Sales   Tobacco Use   Smoking status: Never   Smokeless tobacco: Never  Substance and Sexual Activity   Alcohol use: Not Currently    Alcohol/week: 1.0 standard drink    Types: 1 Glasses of wine per week    Comment: Rare    Drug use: No   Sexual activity: Not on file  Other Topics Concern   Not on file  Social History Narrative   Daily caffeine    Social Determinants of Health  Financial Resource Strain: Not on file  Food Insecurity: Not on file  Transportation Needs: Not on file  Physical Activity: Not on file  Stress: Not on file  Social Connections: Not on file  Intimate Partner Violence: Not on file    FAMILY HISTORY: Family History  Problem Relation Age of Onset   Colon cancer Mother 52   Colon polyps Mother    Colon cancer Paternal Grandfather    Alzheimer's disease Paternal Grandfather    Breast cancer Maternal Aunt    Diabetes Maternal Grandmother    Prostate cancer Maternal Grandfather    Esophageal cancer Neg Hx    Rectal cancer Neg Hx    Stomach  cancer Neg Hx     ALLERGIES:  is allergic to bee pollen-1000-royal jelly [nutritional supplements], gluten meal, and lactose intolerance (gi).  MEDICATIONS:  Scheduled Meds:  Chlorhexidine Gluconate Cloth  6 each Topical Daily   DOXOrubicin/vinCRIStine/etoposide CHEMO IV infusion for Inpatient CI   Intravenous Once   DOXOrubicin/vinCRIStine/etoposide CHEMO IV infusion for Inpatient CI   Intravenous Once   enoxaparin (LOVENOX) injection  40 mg Subcutaneous Q24H   pantoprazole  40 mg Oral QAC breakfast   predniSONE  120 mg Oral QAC breakfast   Continuous Infusions:  ondansetron (ZOFRAN) with dexamethasone (DECADRON) IV 18 mg (09/13/21 1246)   PRN Meds:.acetaminophen, HYDROcodone-acetaminophen, LORazepam, prochlorperazine, senna-docusate  REVIEW OF SYSTEMS:    .10 Point review of Systems was done is negative except as noted above. LABORATORY DATA:  I have reviewed the data as listed  . CBC Latest Ref Rng & Units 09/12/2021 08/28/2021  WBC 4.0 - 10.5 K/uL 10.6(H) 20.8(H)  Hemoglobin 13.0 - 17.0 g/dL 11.5(L) 10.9(L)  Hematocrit 39.0 - 52.0 % 34.3(L) 33.9(L)  Platelets 150 - 400 K/uL 318 235    . CMP Latest Ref Rng & Units 09/12/2021 08/28/2021  Glucose 70 - 99 mg/dL 144(H) 112(H)  BUN 6 - 20 mg/dL 12 8  Creatinine 0.61 - 1.24 mg/dL 0.70 0.82  Sodium 135 - 145 mmol/L 138 141  Potassium 3.5 - 5.1 mmol/L 4.1 3.7  Chloride 98 - 111 mmol/L 107 108  CO2 22 - 32 mmol/L 23 27  Calcium 8.9 - 10.3 mg/dL 9.2 9.1  Total Protein 6.5 - 8.1 g/dL 6.3(L) 6.2(L)  Total Bilirubin 0.3 - 1.2 mg/dL 0.3 0.2(L)  Alkaline Phos 38 - 126 U/L 45 70  AST 15 - 41 U/L 16 10(L)  ALT 0 - 44 U/L 12 10     RADIOGRAPHIC STUDIES: I have personally reviewed the radiological images as listed and agreed with the findings in the report. No results found.  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 Lymphoma FISH panel shows no evidence of c-Myc rearrangement ruling out double hit or triple hit lymphoma. Patient presented with extensive abdominal and retroperitoneal lymphadenopathy, right supraclavicular lymphadenopathy and lung nodules concerning for lymphoma involvement as well as concern for small bowel involvement. 2) pulmonary nodules concerning for lymphoma involvement 3) 4.4 cm small bowel thickening concerning for involvement by lymphoma.  Cannot rule out adenocarcinoma but less likely. 4) acute kidney injury.  Patient's creatinine had bumped to 1.6 but on repeat is back down to normal. 5) Food intolerances including gluten and dairy avoidance.   PLAN -Patient with no primary toxicities from cycle 3-day 1 of dose adjusted EPOCH-R. -Labs are stable -Patient appropriate to continue current dose  of chemotherapy. -As needed Ativan and Compazine for nausea. -Continue Lovenox for DVT prophylaxis -Protonix for GI prophylaxis -Senokot as ordered for constipation -Rituxan and Udenyca will be on day 8 as outpatient-office to arrange these appointments -Outpatient PET CT scan has been scheduled for 09/21/2021. -Patient's wife at bedside updated. -All the patient's questions and concerns were answered in details.  I spent 20 minutes counseling the patient face to face. The total time spent in the appointment was 25 minutes and more than 50% was on counseling and direct patient cares.    Sullivan Lone MD [place] AAHIVMS [facility] Hematology/Oncology Physician [facility]

## 2021-09-14 ENCOUNTER — Encounter: Payer: Self-pay | Admitting: Hematology

## 2021-09-14 LAB — CBC WITH DIFFERENTIAL/PLATELET
Abs Immature Granulocytes: 0.04 10*3/uL (ref 0.00–0.07)
Basophils Absolute: 0 10*3/uL (ref 0.0–0.1)
Basophils Relative: 0 %
Eosinophils Absolute: 0 10*3/uL (ref 0.0–0.5)
Eosinophils Relative: 0 %
HCT: 32.8 % — ABNORMAL LOW (ref 39.0–52.0)
Hemoglobin: 11.2 g/dL — ABNORMAL LOW (ref 13.0–17.0)
Immature Granulocytes: 1 %
Lymphocytes Relative: 11 %
Lymphs Abs: 1 10*3/uL (ref 0.7–4.0)
MCH: 30.5 pg (ref 26.0–34.0)
MCHC: 34.1 g/dL (ref 30.0–36.0)
MCV: 89.4 fL (ref 80.0–100.0)
Monocytes Absolute: 1.3 10*3/uL — ABNORMAL HIGH (ref 0.1–1.0)
Monocytes Relative: 15 %
Neutro Abs: 6.5 10*3/uL (ref 1.7–7.7)
Neutrophils Relative %: 73 %
Platelets: 272 10*3/uL (ref 150–400)
RBC: 3.67 MIL/uL — ABNORMAL LOW (ref 4.22–5.81)
RDW: 17.4 % — ABNORMAL HIGH (ref 11.5–15.5)
WBC: 8.8 10*3/uL (ref 4.0–10.5)
nRBC: 0 % (ref 0.0–0.2)

## 2021-09-14 LAB — COMPREHENSIVE METABOLIC PANEL
ALT: 11 U/L (ref 0–44)
AST: 14 U/L — ABNORMAL LOW (ref 15–41)
Albumin: 3.6 g/dL (ref 3.5–5.0)
Alkaline Phosphatase: 36 U/L — ABNORMAL LOW (ref 38–126)
Anion gap: 6 (ref 5–15)
BUN: 14 mg/dL (ref 6–20)
CO2: 25 mmol/L (ref 22–32)
Calcium: 8.7 mg/dL — ABNORMAL LOW (ref 8.9–10.3)
Chloride: 107 mmol/L (ref 98–111)
Creatinine, Ser: 0.7 mg/dL (ref 0.61–1.24)
GFR, Estimated: >60 mL/min (ref >60)
Glucose, Bld: 106 mg/dL — ABNORMAL HIGH (ref 70–99)
Potassium: 3.5 mmol/L (ref 3.5–5.1)
Sodium: 138 mmol/L (ref 135–145)
Total Bilirubin: 0.4 mg/dL (ref 0.3–1.2)
Total Protein: 6 g/dL — ABNORMAL LOW (ref 6.5–8.1)

## 2021-09-14 MED ORDER — SODIUM CHLORIDE 0.9 % IV SOLN
Freq: Once | INTRAVENOUS | Status: AC
  Administered 2021-09-14: 8 mg via INTRAVENOUS
  Filled 2021-09-14: qty 4

## 2021-09-14 MED ORDER — VINCRISTINE SULFATE CHEMO INJECTION 1 MG/ML
Freq: Once | INTRAVENOUS | Status: AC
  Filled 2021-09-14: qty 10

## 2021-09-14 NOTE — Progress Notes (Signed)
Oncology Discharge Planning Note  [facility] at [facility] Address: [address], [place], [place] [address] Hours of Operation:  Nena Polio, Monday - Friday  Clinic Contact Information:  [phone]) [phone]  Oncology Care Team: Medical Oncologist:  Dr. Sullivan Lone  Patient Details: Name:  Allen, Hodge MRN:   [id number] DOB:   21-Aug-1980 Reason for Current Admission: Lymphoma malignant, large cell [facility])  Discharge Planning Narrative: Discharge follow-up appointments for oncology are current and available on the AVS and MyChart.   Upon discharge from the hospital, hematology/oncology's post discharge plan of care for the outpatient setting is: [facility] Flush/Lab & treatment 2/6 with PET on 2/9   [name] will be called within two business days after discharge to review hematology/oncology's plan of care for full understanding.    Outpatient Oncology Specific Care Only: Oncology appointment transportation needs addressed?:  not applicable Oncology medication management for symptom management addressed?:  not applicable Chemo Alert Card reviewed?:  not applicable Immunotherapy Alert Card reviewed?:  not applicable

## 2021-09-14 NOTE — Progress Notes (Signed)
Chemotherapy dosage and calculations checked and verified with Aldean Baker, RN.

## 2021-09-14 NOTE — Progress Notes (Addendum)
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 Lymphoma FISH panel shows no evidence of c-Myc rearrangement ruling out double hit or triple hit lymphoma. Patient presented with extensive abdominal and retroperitoneal lymphadenopathy, right supraclavicular lymphadenopathy and lung nodules concerning for lymphoma involvement as well as concern for small bowel involvement. 2) pulmonary nodules concerning for lymphoma involvement 3) 4.4 cm small bowel thickening concerning for involvement by lymphoma.  Cannot rule out adenocarcinoma but less likely. 4) acute kidney injury.  Patient's creatinine had bumped to 1.6 but on repeat is back down to normal. 5) Food intolerances including gluten and dairy avoidance.   PLAN -Patient with grade 1 nausea from cycle 3-day 3 of dose adjusted EPOCH-R. -Labs are stable -Patient appropriate to continue current dose of chemotherapy. -As needed Ativan and Compazine for nausea. -Continue Lovenox for DVT prophylaxis -Protonix for GI prophylaxis -Senokot as ordered for constipation -Rituxan and Udenyca will be on day 8 as outpatient-office to arrange these appointments -Outpatient PET CT scan has been scheduled for 09/21/2021. -Schedule message sent to arrange for outpatient follow-up on 09/22/2021 for toxicity check and to review PET scan results. -All the patient's questions and concerns were answered in details. -Plan for discharge home in the a.m. once chemotherapy is completed.  Mikey Bussing, DNP, AGPCNP-BC, AOCNP  SUBJECTIVE: Tolerating chemotherapy well overall.  Reports some mild nausea but no vomiting today.  Taking as needed antiemetics.  Bowels are moving.  He has no other complaints today.  Oncology History  Diffuse large B-cell lymphoma of lymph nodes of multiple regions ([place])   07/21/2021 Initial Diagnosis   Diffuse large B-cell lymphoma of lymph nodes of multiple regions ([place])   07/27/2021 - 08/19/2021 Chemotherapy   Patient is on Treatment Plan : NON-HODGKINS LYMPHOMA R-CHOP q21d     09/04/2021 Cancer Staging   Staging form: Hodgkin and Non-Hodgkin Lymphoma, AJCC 8th Edition - Clinical: Stage IV (Diffuse large B-cell lymphoma) - Signed by Brunetta Genera, MD on 09/04/2021 Stage prefix: Initial diagnosis    09/11/2021 -  Chemotherapy   Patient is on Treatment Plan : IP NON-HODGKINS LYMPHOMA EPOCH q21d     09/18/2021 -  Chemotherapy   Patient is on Treatment Plan : NON-HODGKINS LYMPHOMA Rituximab q21d        REVIEW OF SYSTEMS:   A 10 point review of systems is negative except as noted in the HPI.  I have reviewed the past medical history, past surgical history, social history and family history with the patient and they are unchanged from previous note.   PHYSICAL EXAMINATION: ECOG PERFORMANCE STATUS: 1 - Symptomatic but completely ambulatory  Vitals:   09/14/21 0446 09/14/21 0825  BP: 111/69 107/70  Pulse: 80 68  Resp: 16 14  Temp: 97.8 F (36.6 C) 98 F (36.7 C)  SpO2: 99% 96%   Filed Weights   09/11/21 1343  Weight: 81.1 kg    Intake/Output from previous day: 02/01 0701 - 02/02 0700 In: 1211.2 [P.O.:720; IV Piggyback:491.2] Out: 2150 [Urine:2150]  Physical Exam Vitals reviewed.  Constitutional:      General: He is not in acute distress. HENT:     Head: Normocephalic.  Eyes:     General: No scleral icterus.    Conjunctiva/sclera: Conjunctivae normal.  Neurological:     Mental Status: He is alert and oriented to person, place, and time.  Psychiatric:  Mood and Affect: Mood normal.        Behavior: Behavior normal.        Thought Content: Thought content normal.        Judgment: Judgment normal.    LABORATORY DATA:  I have reviewed the data as listed CMP Latest Ref Rng & Units 09/14/2021 09/13/2021 09/12/2021  Glucose  70 - 99 mg/dL 106(H) 117(H) 144(H)  BUN 6 - 20 mg/dL 14 14 12   Creatinine 0.61 - 1.24 mg/dL 0.70 0.67 0.70  Sodium 135 - 145 mmol/L 138 140 138  Potassium 3.5 - 5.1 mmol/L 3.5 3.5 4.1  Chloride 98 - 111 mmol/L 107 110 107  CO2 22 - 32 mmol/L 25 23 23   Calcium 8.9 - 10.3 mg/dL 8.7(L) 8.9 9.2  Total Protein 6.5 - 8.1 g/dL 6.0(L) 5.5(L) 6.3(L)  Total Bilirubin 0.3 - 1.2 mg/dL 0.4 0.5 0.3  Alkaline Phos 38 - 126 U/L 36(L) 36(L) 45  AST 15 - 41 U/L 14(L) 13(L) 16  ALT 0 - 44 U/L 11 11 12     Lab Results  Component Value Date   WBC 8.8 09/14/2021   HGB 11.2 (L) 09/14/2021   HCT 32.8 (L) 09/14/2021   MCV 89.4 09/14/2021   PLT 272 09/14/2021   NEUTROABS 6.5 09/14/2021    No results found for: CEA1, CEA, CAN199, CA125, PSA1  No results found.   Future Appointments  Date Time Provider [place]  09/18/2021  8:15 AM [facility] [place] None  09/18/2021  9:00 AM [facility] INFUSION [facility] None  09/21/2021  9:00 AM [facility] PET CT 1 [facility] [place]      LOS: 3 days    ADDENDUM  Patient was Personally and independently interviewed, examined and relevant elements of the history of present illness were reviewed in details and an assessment and plan was created. All elements of the patient's history of present illness , assessment and plan were discussed in details with Mikey Bussing DNP. The above documentation reflects our combined findings assessment and plan. He is doing well and has no prohibitive toxicities from treatment at this time.  Labs are stable.  Continue patients current plan of chemotherapy. Minimal grade 1 nausea controlled with current medications. Wife at bedside. Planned discharged tomorrow after completion of Cytoxan.  Sullivan Lone MD MS

## 2021-09-15 ENCOUNTER — Encounter: Payer: Self-pay | Admitting: *Deleted

## 2021-09-15 ENCOUNTER — Encounter: Payer: Self-pay | Admitting: Hematology

## 2021-09-15 LAB — CBC WITH DIFFERENTIAL/PLATELET
Abs Immature Granulocytes: 0.03 10*3/uL (ref 0.00–0.07)
Basophils Absolute: 0 10*3/uL (ref 0.0–0.1)
Basophils Relative: 0 %
Eosinophils Absolute: 0 10*3/uL (ref 0.0–0.5)
Eosinophils Relative: 0 %
HCT: 31.4 % — ABNORMAL LOW (ref 39.0–52.0)
Hemoglobin: 10.6 g/dL — ABNORMAL LOW (ref 13.0–17.0)
Immature Granulocytes: 0 %
Lymphocytes Relative: 14 %
Lymphs Abs: 1.1 10*3/uL (ref 0.7–4.0)
MCH: 29.9 pg (ref 26.0–34.0)
MCHC: 33.8 g/dL (ref 30.0–36.0)
MCV: 88.7 fL (ref 80.0–100.0)
Monocytes Absolute: 0.7 10*3/uL (ref 0.1–1.0)
Monocytes Relative: 8 %
Neutro Abs: 6.1 10*3/uL (ref 1.7–7.7)
Neutrophils Relative %: 78 %
Platelets: 241 10*3/uL (ref 150–400)
RBC: 3.54 MIL/uL — ABNORMAL LOW (ref 4.22–5.81)
RDW: 16.8 % — ABNORMAL HIGH (ref 11.5–15.5)
WBC: 7.8 10*3/uL (ref 4.0–10.5)
nRBC: 0 % (ref 0.0–0.2)

## 2021-09-15 LAB — COMPREHENSIVE METABOLIC PANEL
ALT: 13 U/L (ref 0–44)
AST: 14 U/L — ABNORMAL LOW (ref 15–41)
Albumin: 3.4 g/dL — ABNORMAL LOW (ref 3.5–5.0)
Alkaline Phosphatase: 34 U/L — ABNORMAL LOW (ref 38–126)
Anion gap: 5 (ref 5–15)
BUN: 19 mg/dL (ref 6–20)
CO2: 26 mmol/L (ref 22–32)
Calcium: 8.7 mg/dL — ABNORMAL LOW (ref 8.9–10.3)
Chloride: 104 mmol/L (ref 98–111)
Creatinine, Ser: 0.77 mg/dL (ref 0.61–1.24)
GFR, Estimated: >60 mL/min (ref >60)
Glucose, Bld: 102 mg/dL — ABNORMAL HIGH (ref 70–99)
Potassium: 3.4 mmol/L — ABNORMAL LOW (ref 3.5–5.1)
Sodium: 135 mmol/L (ref 135–145)
Total Bilirubin: 0.6 mg/dL (ref 0.3–1.2)
Total Protein: 5.4 g/dL — ABNORMAL LOW (ref 6.5–8.1)

## 2021-09-15 MED ORDER — HEPARIN SOD (PORK) LOCK FLUSH 100 UNIT/ML IV SOLN
500.0000 [IU] | Freq: Once | INTRAVENOUS | Status: AC
  Administered 2021-09-15: 500 [IU] via INTRAVENOUS
  Filled 2021-09-15: qty 5

## 2021-09-15 MED ORDER — SODIUM CHLORIDE 0.9 % IV SOLN
750.0000 mg/m2 | Freq: Once | INTRAVENOUS | Status: AC
  Administered 2021-09-15: 1520 mg via INTRAVENOUS
  Filled 2021-09-15: qty 76

## 2021-09-15 MED ORDER — LORAZEPAM 0.5 MG PO TABS: 0.5000 mg | ORAL_TABLET | Freq: Four times a day (QID) | ORAL | 0 refills | Status: DC | PRN

## 2021-09-15 MED ORDER — SENNOSIDES-DOCUSATE SODIUM 8.6-50 MG PO TABS: 1.0000 | ORAL_TABLET | Freq: Every evening | ORAL | Status: DC | PRN

## 2021-09-15 MED ORDER — POTASSIUM CHLORIDE CRYS ER 20 MEQ PO TBCR
20.0000 meq | EXTENDED_RELEASE_TABLET | Freq: Once | ORAL | Status: AC
  Administered 2021-09-15: 20 meq via ORAL
  Filled 2021-09-15: qty 1

## 2021-09-15 MED ORDER — ACETAMINOPHEN 325 MG PO TABS: 650.0000 mg | ORAL_TABLET | ORAL | Status: DC | PRN

## 2021-09-15 MED ORDER — SODIUM CHLORIDE 0.9 % IV SOLN
Freq: Once | INTRAVENOUS | Status: AC
  Administered 2021-09-15: 36 mg via INTRAVENOUS
  Filled 2021-09-15: qty 8

## 2021-09-15 NOTE — Discharge Summary (Addendum)
Discharge Summary  Patient ID: Allen Hodge MRN: [id number] DOB/AGE: 1980/12/02 41 y.o.  Admit date: 09/11/2021 Discharge date: 09/15/2021  Discharge Diagnoses:  Principal Problem:   Lymphoma malignant, large cell ([place]) Active Problems:   Encounter for antineoplastic chemotherapy   Discharged Condition: good  Discharge Labs:  CBC    Component Value Date/Time   WBC 7.8 09/15/2021 0515   RBC 3.54 (L) 09/15/2021 0515   HGB 10.6 (L) 09/15/2021 0515   HGB 10.9 (L) 08/28/2021 0948   HCT 31.4 (L) 09/15/2021 0515   PLT 241 09/15/2021 0515   PLT 235 08/28/2021 0948   MCV 88.7 09/15/2021 0515   MCH 29.9 09/15/2021 0515   MCHC 33.8 09/15/2021 0515   RDW 16.8 (H) 09/15/2021 0515   LYMPHSABS 1.1 09/15/2021 0515   MONOABS 0.7 09/15/2021 0515   EOSABS 0.0 09/15/2021 0515   BASOSABS 0.0 09/15/2021 0515   CMP Latest Ref Rng & Units 09/15/2021 09/14/2021 09/13/2021  Glucose 70 - 99 mg/dL 102(H) 106(H) 117(H)  BUN 6 - 20 mg/dL 19 14 14   Creatinine 0.61 - 1.24 mg/dL 0.77 0.70 0.67  Sodium 135 - 145 mmol/L 135 138 140  Potassium 3.5 - 5.1 mmol/L 3.4(L) 3.5 3.5  Chloride 98 - 111 mmol/L 104 107 110  CO2 22 - 32 mmol/L 26 25 23   Calcium 8.9 - 10.3 mg/dL 8.7(L) 8.7(L) 8.9  Total Protein 6.5 - 8.1 g/dL 5.4(L) 6.0(L) 5.5(L)  Total Bilirubin 0.3 - 1.2 mg/dL 0.6 0.4 0.5  Alkaline Phos 38 - 126 U/L 34(L) 36(L) 36(L)  AST 15 - 41 U/L 14(L) 14(L) 13(L)  ALT 0 - 44 U/L 13 11 11     Consults: None  Procedures: None  Disposition:  Discharge disposition: 01-Home or Self Care      Allergies as of 09/15/2021       Reactions   Bee Pollen-1000-royal Jelly [nutritional Supplements] Anaphylaxis   And Bee's Wax - inflammatory reaction per pt    Gluten Meal Other (See Comments)   Abdominal cramping    Lactose Intolerance (gi) Other (See Comments)   Abdominal cramping        Medication List     STOP taking these medications    allopurinol 100 MG tablet Commonly known as: ZYLOPRIM        TAKE these medications    acetaminophen 325 MG tablet Commonly known as: TYLENOL Take 2 tablets (650 mg total) by mouth every 4 (four) hours as needed for mild pain.   b complex vitamins capsule Take 1 capsule by mouth daily.   HYDROcodone-acetaminophen 5-325 MG tablet Commonly known as: Norco Take 1-2 tablets by mouth every 6 (six) hours as needed for moderate pain.   ibuprofen 200 MG tablet Commonly known as: ADVIL Take 400-600 mg by mouth every 6 (six) hours as needed for mild pain or moderate pain.   lidocaine-prilocaine cream Commonly known as: EMLA Apply to affected area once What changed:  how much to take how to take this when to take this reasons to take this additional instructions   loratadine 10 MG tablet Commonly known as: CLARITIN Take 10 mg by mouth daily as needed for allergies.   LORazepam 0.5 MG tablet Commonly known as: Ativan Take 1 tablet (0.5 mg total) by mouth every 6 (six) hours as needed (Nausea or vomiting).   magic mouthwash w/lidocaine Soln Take 5 mLs by mouth 4 (four) times daily as needed for mouth pain. Rx: 50 mL viscous lidocaine 2% 50 mL Maalox 50 mL  diphenhydramine (Benadryl) at 12.5 mg per 5 ml elixir 50 mL nystatin (100,000U per 5 mL) suspension 50 mL distilled water   ondansetron 8 MG tablet Commonly known as: Zofran Take 1 tablet (8 mg total) by mouth 2 (two) times daily as needed for refractory nausea / vomiting. Start on day 3 after cyclophosphamide chemotherapy.   pantoprazole 40 MG tablet Commonly known as: PROTONIX Take 1 tablet (40 mg total) by mouth daily before breakfast. What changed:  when to take this reasons to take this   prochlorperazine 10 MG tablet Commonly known as: COMPAZINE Take 1 tablet (10 mg total) by mouth every 6 (six) hours as needed (Nausea or vomiting).   senna-docusate 8.6-50 MG tablet Commonly known as: Senokot-S Take 1 tablet by mouth at bedtime as needed for mild constipation.    VITAMIN C PO Take 1 tablet by mouth daily.   Vitamin D-3 125 MCG (5000 UT) Tabs Take 5,000 Units by mouth 2 (two) times daily.       HPI: Allen Hodge is a 41 year old male with diffuse large B-cell lymphoma.  He initially received 2 cycles of R-CHOP and has tolerated well.  Although patient does not have double hit lymphoma, recommendation was made to switch to [facility] beginning with cycle #3.  The patient reported feeling well on the day of admission.  He was not having any fevers, chills, mucositis, chest pain, shortness of breath, abdominal pain, nausea, vomiting, constipation, diarrhea.  No constipation reported. The patient presents to the hospital on the day of admission for cycle #3 of chemotherapy-switching to EPOCH-R from R-CHOP.  Hospital Course: Chemotherapy started as planned on the day of admission.  He overall tolerated his chemotherapy well with the exception of grade 1 nausea.  Nausea was well controlled with as needed antiemetics.  On the day of discharge, he felt well.  Labs stable with the exception of mild hypokalemia with a potassium of 3.4.  He was given a one-time dose of potassium chloride 20 mEq.  Also discussed potassium rich foods.  The patient is stable for discharge after completion of chemotherapy on 09/15/2021.  He will discharged home.  Outpatient follow-up appointments have been scheduled including rituximab and Udenyca on 09/18/2021, PET scan on 09/21/2021, and follow-up visit for toxicity check and to review PET scan results on 09/25/2021   Discharge Instructions     Diet general   Complete by: As directed    Increase activity slowly   Complete by: As directed       Future Appointments  Date Time Provider [place]  09/18/2021  8:15 AM [facility] [place] None  09/18/2021  9:00 AM [facility] INFUSION [facility] None  09/21/2021  9:00 AM [facility] PET CT 1 [facility] [place]  09/25/2021  8:30 AM [facility] [place] FLUSH [facility] None  09/25/2021  9:20  AM Brunetta Genera, MD [facility] None   Signed: Mikey Bussing 09/15/2021, 8:44 AM    ADDENDUM  .Patient was Personally and independently interviewed, examined and relevant elements of the discharge plan and discussed details with Mikey Bussing DNP. The above documentation reflects our combined findings assessment and plan.   Sullivan Lone MD MS TT>44mins

## 2021-09-15 NOTE — Progress Notes (Signed)
Patient was contacted by Dr. Grier Mitts nursing staff to notify of follow up and all other appointments and will have first visit Monday 09/18/21.

## 2021-09-15 NOTE — Progress Notes (Signed)
Oncology Discharge Planning Note  [facility] at [facility] Address: [address], [place], [place] [address] Hours of Operation:  Nena Polio, Monday - Friday  Clinic Contact Information:  [phone]) [phone]  Oncology Care Team: Medical Oncologist:  Dr. Irene Limbo  Patient Details: Name:  Allen Hodge, Allen Hodge MRN:   [id number] DOB:   05/02/1981 Reason for Current Admission: Lymphoma malignant, large cell [facility])  Discharge Planning Narrative: Discharge follow-up appointments for oncology are current and available on the AVS and MyChart.   Upon discharge from the hospital, hematology/oncology's post discharge plan of care for the outpatient setting is: 2/6 with port flush/lab and treatment with PET on 2/9 with follow up with Dr Irene Limbo on 2/13 with labs.   Fread Kottke will be called within two business days after discharge to review hematology/oncology's plan of care for full understanding.    Outpatient Oncology Specific Care Only: Oncology appointment transportation needs addressed?:  not applicable Oncology medication management for symptom management addressed?:  not applicable Chemo Alert Card reviewed?:  not applicable Immunotherapy Alert Card reviewed?:  not applicable

## 2021-09-18 ENCOUNTER — Inpatient Hospital Stay: Payer: No Typology Code available for payment source | Attending: Hematology

## 2021-09-18 ENCOUNTER — Inpatient Hospital Stay: Payer: No Typology Code available for payment source

## 2021-09-18 ENCOUNTER — Other Ambulatory Visit: Payer: Self-pay

## 2021-09-18 VITALS — BP 117/73 | HR 66 | Temp 98.0°F | Resp 18 | Wt 183.8 lb

## 2021-09-18 DIAGNOSIS — Z5189 Encounter for other specified aftercare: Secondary | ICD-10-CM | POA: Insufficient documentation

## 2021-09-18 DIAGNOSIS — C8590 Non-Hodgkin lymphoma, unspecified, unspecified site: Secondary | ICD-10-CM | POA: Diagnosis present

## 2021-09-18 DIAGNOSIS — Z7189 Other specified counseling: Secondary | ICD-10-CM

## 2021-09-18 DIAGNOSIS — Z5112 Encounter for antineoplastic immunotherapy: Secondary | ICD-10-CM | POA: Diagnosis present

## 2021-09-18 DIAGNOSIS — C8338 Diffuse large B-cell lymphoma, lymph nodes of multiple sites: Secondary | ICD-10-CM

## 2021-09-18 DIAGNOSIS — Z95828 Presence of other vascular implants and grafts: Secondary | ICD-10-CM

## 2021-09-18 LAB — CBC WITH DIFFERENTIAL (CANCER CENTER ONLY)
Abs Immature Granulocytes: 0.01 10*3/uL (ref 0.00–0.07)
Basophils Absolute: 0 10*3/uL (ref 0.0–0.1)
Basophils Relative: 0 %
Eosinophils Absolute: 0.4 10*3/uL (ref 0.0–0.5)
Eosinophils Relative: 7 %
HCT: 33.9 % — ABNORMAL LOW (ref 39.0–52.0)
Hemoglobin: 11.8 g/dL — ABNORMAL LOW (ref 13.0–17.0)
Immature Granulocytes: 0 %
Lymphocytes Relative: 29 %
Lymphs Abs: 1.7 10*3/uL (ref 0.7–4.0)
MCH: 29.9 pg (ref 26.0–34.0)
MCHC: 34.8 g/dL (ref 30.0–36.0)
MCV: 86 fL (ref 80.0–100.0)
Monocytes Absolute: 0.1 10*3/uL (ref 0.1–1.0)
Monocytes Relative: 1 %
Neutro Abs: 3.7 10*3/uL (ref 1.7–7.7)
Neutrophils Relative %: 63 %
Platelet Count: 267 10*3/uL (ref 150–400)
RBC: 3.94 MIL/uL — ABNORMAL LOW (ref 4.22–5.81)
RDW: 17.1 % — ABNORMAL HIGH (ref 11.5–15.5)
WBC Count: 5.9 10*3/uL (ref 4.0–10.5)
nRBC: 0 % (ref 0.0–0.2)

## 2021-09-18 LAB — CMP (CANCER CENTER ONLY)
ALT: 10 U/L (ref 0–44)
AST: 7 U/L — ABNORMAL LOW (ref 15–41)
Albumin: 4 g/dL (ref 3.5–5.0)
Alkaline Phosphatase: 36 U/L — ABNORMAL LOW (ref 38–126)
Anion gap: 7 (ref 5–15)
BUN: 14 mg/dL (ref 6–20)
CO2: 29 mmol/L (ref 22–32)
Calcium: 9.2 mg/dL (ref 8.9–10.3)
Chloride: 102 mmol/L (ref 98–111)
Creatinine: 0.64 mg/dL (ref 0.61–1.24)
GFR, Estimated: >60 mL/min (ref >60)
Glucose, Bld: 93 mg/dL (ref 70–99)
Potassium: 3.6 mmol/L (ref 3.5–5.1)
Sodium: 138 mmol/L (ref 135–145)
Total Bilirubin: 0.8 mg/dL (ref 0.3–1.2)
Total Protein: 6 g/dL — ABNORMAL LOW (ref 6.5–8.1)

## 2021-09-18 LAB — LACTATE DEHYDROGENASE: LDH: 93 U/L — ABNORMAL LOW (ref 98–192)

## 2021-09-18 MED ORDER — METHYLPREDNISOLONE SODIUM SUCC 125 MG IJ SOLR
125.0000 mg | Freq: Every day | INTRAMUSCULAR | Status: DC
  Administered 2021-09-18: 125 mg via INTRAVENOUS
  Filled 2021-09-18: qty 2

## 2021-09-18 MED ORDER — LORAZEPAM 1 MG PO TABS
0.5000 mg | ORAL_TABLET | Freq: Once | ORAL | Status: AC
  Administered 2021-09-18: 0.5 mg via ORAL
  Filled 2021-09-18: qty 1

## 2021-09-18 MED ORDER — ACETAMINOPHEN 325 MG PO TABS
650.0000 mg | ORAL_TABLET | Freq: Once | ORAL | Status: AC
  Administered 2021-09-18: 650 mg via ORAL
  Filled 2021-09-18: qty 2

## 2021-09-18 MED ORDER — SODIUM CHLORIDE 0.9 % IV SOLN: Freq: Once | INTRAVENOUS | Status: AC

## 2021-09-18 MED ORDER — FAMOTIDINE IN NACL 20-0.9 MG/50ML-% IV SOLN
20.0000 mg | Freq: Once | INTRAVENOUS | Status: AC
  Administered 2021-09-18: 20 mg via INTRAVENOUS
  Filled 2021-09-18: qty 50

## 2021-09-18 MED ORDER — SODIUM CHLORIDE 0.9% FLUSH
10.0000 mL | INTRAVENOUS | Status: DC | PRN
  Administered 2021-09-18: 10 mL

## 2021-09-18 MED ORDER — PEGFILGRASTIM-BMEZ 6 MG/0.6ML ~~LOC~~ SOSY
6.0000 mg | PREFILLED_SYRINGE | Freq: Once | SUBCUTANEOUS | Status: AC
  Administered 2021-09-18: 6 mg via SUBCUTANEOUS
  Filled 2021-09-18: qty 0.6

## 2021-09-18 MED ORDER — DIPHENHYDRAMINE HCL 25 MG PO CAPS
50.0000 mg | ORAL_CAPSULE | Freq: Once | ORAL | Status: AC
  Administered 2021-09-18: 50 mg via ORAL
  Filled 2021-09-18: qty 2

## 2021-09-18 MED ORDER — HEPARIN SOD (PORK) LOCK FLUSH 100 UNIT/ML IV SOLN
500.0000 [IU] | Freq: Once | INTRAVENOUS | Status: AC | PRN
  Administered 2021-09-18: 500 [IU]

## 2021-09-18 MED ORDER — SODIUM CHLORIDE 0.9% FLUSH
10.0000 mL | Freq: Once | INTRAVENOUS | Status: AC
  Administered 2021-09-18: 10 mL

## 2021-09-18 MED ORDER — SODIUM CHLORIDE 0.9 % IV SOLN
375.0000 mg/m2 | Freq: Once | INTRAVENOUS | Status: AC
  Administered 2021-09-18: 800 mg via INTRAVENOUS
  Filled 2021-09-18: qty 50

## 2021-09-18 NOTE — Patient Instructions (Addendum)
[facility]ICAL [facility]COLOGY  Discharge Instructions: Thank you for choosing [facility] to provide your oncology and hematology care.   If you have a lab appointment with the Cancer Center, please go directly to the Cancer Center and check in at the registration area.   Wear comfortable clothing and clothing appropriate for easy access to any Portacath or PICC line.   We strive to give you quality time with your provider. You may need to reschedule your appointment if you arrive late (15 or more minutes).  Arriving late affects you and other patients whose appointments are after yours.  Also, if you miss three or more appointments without notifying the office, you may be dismissed from the clinic at the provider's discretion.      For prescription refill requests, have your pharmacy contact our office and allow 72 hours for refills to be completed.    Today you received the following chemotherapy and/or immunotherapy agents rituxan      To help prevent nausea and vomiting after your treatment, we encourage you to take your nausea medication as directed.  BELOW ARE SYMPTOMS THAT SHOULD BE REPORTED IMMEDIATELY: *FEVER GREATER THAN 100.4 F (38 C) OR HIGHER *CHILLS OR SWEATING *NAUSEA AND VOMITING THAT IS NOT CONTROLLED WITH YOUR NAUSEA MEDICATION *UNUSUAL SHORTNESS OF BREATH *UNUSUAL BRUISING OR BLEEDING *URINARY PROBLEMS (pain or burning when urinating, or frequent urination) *BOWEL PROBLEMS (unusual diarrhea, constipation, pain near the anus) TENDERNESS IN MOUTH AND THROAT WITH OR WITHOUT PRESENCE OF ULCERS (sore throat, sores in mouth, or a toothache) UNUSUAL RASH, SWELLING OR PAIN  UNUSUAL VAGINAL DISCHARGE OR ITCHING   Items with * indicate a potential emergency and should be followed up as soon as possible or go to the Emergency Department if any problems should occur.  Please show the CHEMOTHERAPY ALERT CARD or IMMUNOTHERAPY ALERT CARD at check-in to the  Emergency Department and triage nurse.  Should you have questions after your visit or need to cancel or reschedule your appointment, please contact [facility]  Dept: [phone]  and follow the prompts.  Office hours are 8:00 a.m. to 4:30 p.m. Monday - Friday. Please note that voicemails left after 4:00 p.m. may not be returned until the following business day.  We are closed weekends and major holidays. You have access to a nurse at all times for urgent questions. Please call the main number to the clinic Dept: [phone] and follow the prompts.   For any non-urgent questions, you may also contact your provider using MyChart. We now offer e-Visits for anyone 18 and older to request care online for non-urgent symptoms. For details visit [email or web address].   Also download the MyChart app! Go to the app store, search "MyChart", open the app, select , and log in with your MyChart username and password.  Due to Covid, a mask is required upon entering the hospital/clinic. If you do not have a mask, one will be given to you upon arrival. For doctor visits, patients may have 1 support person aged 18 or older with them. For treatment visits, patients cannot have anyone with them due to current Covid guidelines and our immunocompromised population.   

## 2021-09-21 ENCOUNTER — Encounter (HOSPITAL_COMMUNITY)
Admission: RE | Admit: 2021-09-21 | Discharge: 2021-09-21 | Disposition: A | Discharge status: Home / Self Care | Payer: No Typology Code available for payment source | Source: Ambulatory Visit | Attending: Hematology | Admitting: Hematology

## 2021-09-21 ENCOUNTER — Other Ambulatory Visit: Payer: Self-pay

## 2021-09-21 DIAGNOSIS — C8338 Diffuse large B-cell lymphoma, lymph nodes of multiple sites: Secondary | ICD-10-CM | POA: Diagnosis not present

## 2021-09-21 LAB — GLUCOSE, CAPILLARY: Glucose-Capillary: 85 mg/dL (ref 70–99)

## 2021-09-21 MED ORDER — FLUDEOXYGLUCOSE F - 18 (FDG) INJECTION
10.0000 | Freq: Once | INTRAVENOUS | Status: AC | PRN
  Administered 2021-09-21: 9.06 via INTRAVENOUS

## 2021-09-24 ENCOUNTER — Other Ambulatory Visit: Payer: Self-pay | Admitting: Hematology

## 2021-09-24 DIAGNOSIS — C8338 Diffuse large B-cell lymphoma, lymph nodes of multiple sites: Secondary | ICD-10-CM

## 2021-09-25 ENCOUNTER — Inpatient Hospital Stay: Payer: No Typology Code available for payment source

## 2021-09-25 ENCOUNTER — Other Ambulatory Visit: Payer: Self-pay

## 2021-09-25 ENCOUNTER — Encounter: Payer: Self-pay | Admitting: Hematology

## 2021-09-25 ENCOUNTER — Inpatient Hospital Stay: Payer: No Typology Code available for payment source | Admitting: Hematology

## 2021-09-25 VITALS — BP 95/64 | HR 68 | Temp 97.8°F | Resp 16 | Ht 74.0 in | Wt 178.7 lb

## 2021-09-25 DIAGNOSIS — Z95828 Presence of other vascular implants and grafts: Secondary | ICD-10-CM

## 2021-09-25 DIAGNOSIS — C8338 Diffuse large B-cell lymphoma, lymph nodes of multiple sites: Secondary | ICD-10-CM

## 2021-09-25 DIAGNOSIS — Z5111 Encounter for antineoplastic chemotherapy: Secondary | ICD-10-CM

## 2021-09-25 DIAGNOSIS — Z5112 Encounter for antineoplastic immunotherapy: Secondary | ICD-10-CM | POA: Diagnosis not present

## 2021-09-25 DIAGNOSIS — Z7189 Other specified counseling: Secondary | ICD-10-CM

## 2021-09-25 LAB — CMP (CANCER CENTER ONLY)
ALT: 23 U/L (ref 0–44)
AST: 14 U/L — ABNORMAL LOW (ref 15–41)
Albumin: 4.1 g/dL (ref 3.5–5.0)
Alkaline Phosphatase: 58 U/L (ref 38–126)
Anion gap: 6 (ref 5–15)
BUN: 13 mg/dL (ref 6–20)
CO2: 27 mmol/L (ref 22–32)
Calcium: 9.2 mg/dL (ref 8.9–10.3)
Chloride: 106 mmol/L (ref 98–111)
Creatinine: 0.79 mg/dL (ref 0.61–1.24)
GFR, Estimated: >60 mL/min (ref >60)
Glucose, Bld: 100 mg/dL — ABNORMAL HIGH (ref 70–99)
Potassium: 3.8 mmol/L (ref 3.5–5.1)
Sodium: 139 mmol/L (ref 135–145)
Total Bilirubin: 0.2 mg/dL — ABNORMAL LOW (ref 0.3–1.2)
Total Protein: 6.1 g/dL — ABNORMAL LOW (ref 6.5–8.1)

## 2021-09-25 LAB — CBC WITH DIFFERENTIAL (CANCER CENTER ONLY)
Abs Immature Granulocytes: 2.23 10*3/uL — ABNORMAL HIGH (ref 0.00–0.07)
Basophils Absolute: 0.3 10*3/uL — ABNORMAL HIGH (ref 0.0–0.1)
Basophils Relative: 2 %
Eosinophils Absolute: 0.3 10*3/uL (ref 0.0–0.5)
Eosinophils Relative: 2 %
HCT: 32.4 % — ABNORMAL LOW (ref 39.0–52.0)
Hemoglobin: 10.9 g/dL — ABNORMAL LOW (ref 13.0–17.0)
Immature Granulocytes: 13 %
Lymphocytes Relative: 12 %
Lymphs Abs: 2 10*3/uL (ref 0.7–4.0)
MCH: 30.1 pg (ref 26.0–34.0)
MCHC: 33.6 g/dL (ref 30.0–36.0)
MCV: 89.5 fL (ref 80.0–100.0)
Monocytes Absolute: 1.9 10*3/uL — ABNORMAL HIGH (ref 0.1–1.0)
Monocytes Relative: 11 %
Neutro Abs: 10.1 10*3/uL — ABNORMAL HIGH (ref 1.7–7.7)
Neutrophils Relative %: 60 %
Platelet Count: 191 10*3/uL (ref 150–400)
RBC: 3.62 MIL/uL — ABNORMAL LOW (ref 4.22–5.81)
RDW: 18.4 % — ABNORMAL HIGH (ref 11.5–15.5)
Smear Review: NORMAL
WBC Count: 16.8 10*3/uL — ABNORMAL HIGH (ref 4.0–10.5)
nRBC: 0.2 % (ref 0.0–0.2)

## 2021-09-25 LAB — LACTATE DEHYDROGENASE: LDH: 193 U/L — ABNORMAL HIGH (ref 98–192)

## 2021-09-25 MED ORDER — HEPARIN SOD (PORK) LOCK FLUSH 100 UNIT/ML IV SOLN
500.0000 [IU] | Freq: Once | INTRAVENOUS | Status: AC
  Administered 2021-09-25: 500 [IU]

## 2021-09-25 MED ORDER — SODIUM CHLORIDE 0.9% FLUSH
10.0000 mL | Freq: Once | INTRAVENOUS | Status: AC
  Administered 2021-09-25: 10 mL

## 2021-10-01 ENCOUNTER — Encounter: Payer: Self-pay | Admitting: Hematology

## 2021-10-01 NOTE — Progress Notes (Addendum)
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  He has tolerated treatment well and has no acute new toxicities.  Eating well.  Nausea is controlled.  Good bowel movements. No infection issues. No fevers no chills no night sweats no unexpected weight loss. Minimal temporary tingling in his hands and feet but no significant persistent neuropathy.  Patient had a PET scan on 09/21/2021 which shows marked response to treatment of his lymphoma with no overt FDG avid lesions more than a Deauville 2. Patient is glad with the response.  Labs are stable.  We discussed that we will proceed with cycle 4 dose adjusted EPOCH without additional dose escalations at this time.  MEDICAL HISTORY:  Past Medical History:  Diagnosis Date   Allergy    Cancer ([place])    skin cancer   Herniated disc    Hx of skin cancer, basal cell    Hyperlipidemia    borderline- no meds     SURGICAL HISTORY: Past Surgical History:  Procedure Laterality Date   COLONOSCOPY     last 2013   ESOPHAGOGASTRODUODENOSCOPY (EGD) WITH PROPOFOL N/A 06/29/2021   Procedure: ESOPHAGOGASTRODUODENOSCOPY (EGD) WITH PROPOFOL;  Surgeon: Milus Banister, MD;  Location: [facility];  Service: Endoscopy;  Laterality: N/A;   EUS N/A 06/29/2021   Procedure: UPPER ENDOSCOPIC ULTRASOUND (EUS) RADIAL;  Surgeon: Milus Banister, MD;  Location: [facility];  Service: Endoscopy;  Laterality: N/A;   FINE NEEDLE ASPIRATION N/A  06/29/2021   Procedure: FINE NEEDLE ASPIRATION (FNA) LINEAR;  Surgeon: Milus Banister, MD;  Location: [facility];  Service: Endoscopy;  Laterality: N/A;   IR IMAGING GUIDED PORT INSERTION  07/26/2021   VASECTOMY      SOCIAL HISTORY: Social History   Socioeconomic History   Marital status: Married    Spouse name: Not on file   Number of children: 1   Years of education: Not on file   Highest education level: Not on file  Occupational History   Occupation: Sales   Tobacco Use   Smoking status: Never   Smokeless tobacco: Never  Substance and Sexual Activity   Alcohol use: Not Currently    Alcohol/week: 1.0 standard drink    Types: 1 Glasses of wine per week    Comment: Rare    Drug use: No   Sexual activity: Not on file  Other Topics Concern   Not on file  Social History Narrative   Daily caffeine    Social Determinants of Health   Financial Resource Strain: Not on file  Food Insecurity: Not on file  Transportation Needs: Not on file  Physical Activity: Not on file  Stress: Not on file  Social Connections: Not on file  Intimate Partner Violence: Not on file    FAMILY HISTORY: Family History  Problem Relation Age of Onset   Colon cancer Mother 37   Colon polyps Mother    Colon cancer Paternal Grandfather    Alzheimer's disease Paternal Grandfather    Breast cancer Maternal Aunt  Diabetes Maternal Grandmother    Prostate cancer Maternal Grandfather    Esophageal cancer Neg Hx    Rectal cancer Neg Hx    Stomach cancer Neg Hx     ALLERGIES:  is allergic to bee pollen-1000-royal jelly [nutritional supplements], gluten meal, and lactose intolerance (gi).  MEDICATIONS:  No current facility-administered medications for this visit.   Current Outpatient Medications  Medication Sig Dispense Refill   LORazepam (ATIVAN) 0.5 MG tablet Take 1 tablet (0.5 mg total) by mouth every 6 (six) hours as needed (Nausea or vomiting). 30 tablet 0   Facility-Administered  Medications Ordered in Other Visits  Medication Dose Route Frequency Provider Last Rate Last Admin   0.9 %  sodium chloride infusion   Intravenous Continuous Brunetta Genera, MD 10 mL/hr at 10/05/21 1246 New Bag at 10/05/21 1246   acetaminophen (TYLENOL) tablet 650 mg  650 mg Oral Q4H PRN Maryanna Shape, NP       Chlorhexidine Gluconate Cloth 2 % PADS 6 each  6 each Topical Daily Brunetta Genera, MD   6 each at 10/05/21 1047   Cold Pack 1 packet  1 packet Topical Once PRN Brunetta Genera, MD       Derrill Memo ON 10/06/2021] cyclophosphamide (CYTOXAN) 1,520 mg in sodium chloride 0.9 % 250 mL chemo infusion  750 mg/m2 (Treatment Plan Recorded) Intravenous Once Brunetta Genera, MD       DOXOrubicin (ADRIAMYCIN) 20 mg, etoposide (VEPESID) 102 mg, vinCRIStine (ONCOVIN) 0.8 mg in sodium chloride 0.9 % 1,000 mL chemo infusion   Intravenous Once Brunetta Genera, MD 51 mL/hr at 10/05/21 1322 New Bag at 10/05/21 1322   Hot Pack 1 packet  1 packet Topical Once PRN Brunetta Genera, MD       HYDROcodone-acetaminophen (NORCO/VICODIN) 5-325 MG per tablet 1-2 tablet  1-2 tablet Oral Q6H PRN Maryanna Shape, NP       lidocaine-prilocaine (EMLA) cream 1 application  1 application Topical Daily PRN Maryanna Shape, NP       loratadine (CLARITIN) tablet 10 mg  10 mg Oral Daily PRN Maryanna Shape, NP       LORazepam (ATIVAN) tablet 0.5 mg  0.5 mg Oral Q6H PRN Maryanna Shape, NP   0.5 mg at 10/05/21 1418   magic mouthwash w/lidocaine  5 mL Oral QID PRN Maryanna Shape, NP       [START ON 10/06/2021] ondansetron (ZOFRAN) 16 mg, dexamethasone (DECADRON) 20 mg in sodium chloride 0.9 % 50 mL IVPB   Intravenous Once Brunetta Genera, MD       pantoprazole (PROTONIX) EC tablet 40 mg  40 mg Oral QAC breakfast Mikey Bussing R, NP   40 mg at 10/05/21 0843   predniSONE (DELTASONE) tablet 60 mg  60 mg Oral QAC breakfast Brunetta Genera, MD   60 mg at 10/05/21 8453   prochlorperazine  (COMPAZINE) tablet 10 mg  10 mg Oral Q6H PRN Maryanna Shape, NP       senna-docusate (Senokot-S) tablet 1 tablet  1 tablet Oral Q1200 Maryanna Shape, NP   1 tablet at 10/05/21 1246   sodium bicarbonate/sodium chloride mouthwash 1049ml   Mouth Rinse PRN Mikey Bussing R, NP       sodium chloride flush (NS) 0.9 % injection 10-40 mL  10-40 mL Intracatheter Q12H Curcio, Kristin R, NP   10 mL at 10/02/21 2200   sodium chloride flush (NS) 0.9 % injection 10-40 mL  10-40 mL Intracatheter PRN Maryanna Shape, NP        REVIEW OF SYSTEMS:   10 Point review of Systems was done is negative except as noted above.  PHYSICAL EXAMINATION: ECOG PERFORMANCE STATUS: 1 - Symptomatic but completely ambulatory  Vitals:   09/25/21 0918  BP: 95/64  Pulse: 68  Resp: 16  Temp: 97.8 F (36.6 C)  SpO2: 100%   Filed Weights   09/25/21 0918  Weight: 178 lb 11.2 oz (81.1 kg)   .Body mass index is 22.94 kg/m.  NAD GENERAL:alert, in no acute distress and comfortable SKIN: no acute rashes, no significant lesions EYES: conjunctiva are pink and non-injected, sclera anicteric OROPHARYNX: MMM, no exudates, no oropharyngeal erythema or ulceration NECK: supple, no JVD LYMPH:  no palpable lymphadenopathy in the cervical, axillary or inguinal regions LUNGS: clear to auscultation b/l with normal respiratory effort HEART: regular rate & rhythm ABDOMEN:  normoactive bowel sounds , non tender, not distended. Extremity: no pedal edema PSYCH: alert & oriented x 3 with fluent speech NEURO: no focal motor/sensory deficits   LABORATORY DATA:   I have reviewed the data as listed  CBC Latest Ref Rng & Units 10/05/2021 10/04/2021 10/03/2021  WBC 4.0 - 10.5 K/uL 15.3(H) 18.2(H) 24.4(H)  Hemoglobin 13.0 - 17.0 g/dL 11.3(L) 11.5(L) 12.3(L)  Hematocrit 39.0 - 52.0 % 33.2(L) 34.9(L) 36.7(L)  Platelets 150 - 400 K/uL 213 200 206    . CMP Latest Ref Rng & Units 10/05/2021 10/04/2021 10/03/2021  Glucose 70 - 99 mg/dL  110(H) 123(H) 123(H)  BUN 6 - 20 mg/dL 18 20 18   Creatinine 0.61 - 1.24 mg/dL 0.72 0.74 0.78  Sodium 135 - 145 mmol/L 137 136 136  Potassium 3.5 - 5.1 mmol/L 3.5 3.7 3.9  Chloride 98 - 111 mmol/L 105 107 106  CO2 22 - 32 mmol/L 26 24 23   Calcium 8.9 - 10.3 mg/dL 8.9 8.9 9.4  Total Protein 6.5 - 8.1 g/dL 5.8(L) 6.1(L) 6.6  Total Bilirubin 0.3 - 1.2 mg/dL 0.4 0.2(L) 0.4  Alkaline Phos 38 - 126 U/L 43 45 55  AST 15 - 41 U/L 14(L) 14(L) 17  ALT 0 - 44 U/L 17 15 18    Component     Latest Ref Rng & Units 07/11/2021  Color, Urine     YELLOW YELLOW  Appearance     CLEAR CLEAR  Specific Gravity, Urine     1.005 - 1.030 1.016  pH     5.0 - 8.0 5.0  Glucose, UA     NEGATIVE mg/dL NEGATIVE  Hgb urine dipstick     NEGATIVE NEGATIVE  Bilirubin Urine     NEGATIVE NEGATIVE  Ketones, ur     NEGATIVE mg/dL 20 (A)  Protein     NEGATIVE mg/dL NEGATIVE  Nitrite     NEGATIVE NEGATIVE  Leukocytes,Ua     NEGATIVE NEGATIVE  HIV Screen 4th Generation wRfx     Non Reactive Non Reactive  Hep B Core Total Ab     NON REACTIVE NON REACTIVE  Hepatitis B Surface Ag     NON REACTIVE NON REACTIVE  HCV Ab     NON REACTIVE NON REACTIVE  Uric Acid, Serum     3.7 - 8.6 mg/dL 8.0  LDH     98 - 192 U/L 300 (H)   SURGICAL PATHOLOGY  CASE: [phone]  PATIENT: Allen Hodge  Surgical Pathology Report      Specimen Submitted:  A. Lymph node, right neck  Clinical History: History of malignant non Hodgkin's lymphoma after EUS  biopsy on 11/17, however sample was insufficient to subclassify for  treatment planning.  Now post US guided bx of hypermetabolic right  supraclavicular lymph node       DIAGNOSIS:  A. LYMPH NODE, RIGHT NECK; ULTRASOUND-GUIDED BIOPSY:  - LARGE B-CELL LYMPHOMA.  - SEE COMMENT.   Comment:  Sections demonstrate sheets of large, markedly atypical cells with  prominent macronucleoli.  Mitotic activity is brisk.  Flow cytometry  demonstrates a CD5 negative,  CD10 negative monoclonal B cell population.  Definitive classification is dependent on prognostically significant IHC  and FISH studies, which will be reported in an addendum.      RADIOGRAPHIC STUDIES: I have personally reviewed the radiological images as listed and agreed with the findings in the report. NM PET Image Restag (PS) Skull Base To Thigh  Result Date: 09/22/2021 CLINICAL DATA:  Subsequent treatment strategy for lymphoma. EXAM: NUCLEAR MEDICINE PET SKULL BASE TO THIGH TECHNIQUE: 9.1 mCi F-18 FDG was injected intravenously. Full-ring PET imaging was performed from the skull base to thigh after the radiotracer. CT data was obtained and used for attenuation correction and anatomic localization. Fasting blood glucose: 85 mg/dl COMPARISON:  07/04/2021 FINDINGS: Mediastinal blood pool activity: SUV max 2.2 Liver activity: SUV max 3.2 NECK: Mild asymmetric activity along the vicinity of the right palatine tonsil with maximum SUV 3 point 4, contralateral side with maximum SUV 4.2. Previously the right palatine tonsil region had a maximum SUV of 5.6. No discernible mass on the CT data. This may well be physiologic. A previous 0.6 cm right level V lymph node with a maximum SUV of 4.1 shown on the prior exam has completely resolved (Deauville 1). Incidental CT findings: Mild chronic bilateral maxillary sinusitis. CHEST: Previously hypermetabolic paratracheal, subcarinal, and left paraesophageal adenopathy has completely resolved (Deauville 1). 0.5 cm right supraclavicular lymph node with maximum SUV 1.7 on image 55 series 4 (Deauville 2), previously 1.2 cm with maximum SUV 6.3. Accentuated distal esophageal activity without CT abnormality, maximum SUV 4.4, probably physiologic. Incidental CT findings: Left Port-A-Cath tip: Right atrium. Aberrant right subclavian artery passes behind the esophagus. Mild linear scarring in the right lower lobe. Previous sub solid nodularity in the right upper lobe has  resolved. ABDOMEN/PELVIS: The previous bulky adenopathy in the mesentery and retroperitoneum has mostly resolved, with stranding in the mesentery as is often encountered in the setting of successful treatment in lymphoma. Index lymph node anterior to the left adrenal gland measuring 0.6 cm in short axis on image 124 series 4 has a maximum SUV of 2.1 (Deauville 2), previously measuring 1.7 cm with maximum SUV 7.3. No new adenopathy in the abdomen/pelvis. The previously thickened and hypermetabolic loop of small bowel in the central pelvis shown on the prior exam currently appears normal (Deauville 1). Incidental CT findings: Prominent stool throughout the colon favors constipation. SKELETON: Diffuse accentuated metabolic activity in the skeleton. Incidental CT findings: none IMPRESSION: 1. Marked interval reduction in size and activity of prior adenopathy in the and abdomen. Much of the prior adenopathy has completely resolved (Deauville 1), with some small remaining lymph nodes demonstrating Deauville 2 levels of activity. The previous abnormal hypermetabolic small bowel wall thickening in the pelvis has completely resolved. 2. Diffuse accentuated metabolic activity in the skeleton, probably attributable to granulocyte stimulation. 3. 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 Prominent stool throughout the colon favors constipation. Electronically Signed  By: Van Clines M.D.   On: 09/22/2021 09:35    ASSESSMENT & PLAN:   Very pleasant 42 year old gentleman with some chronic food related bowel irregularities with  1) Stage IVA large B-cell lymphoma NOS.  Double expressor ABC type non-germinal center (activated B cell) immunophenotype. Co-expression of BCL2 and C-MYC by IHC may be associated with a more aggressive clinical course.   Immunohistochemical studies demonstrate the tumor cells to be positive   for CD20, BCL2, BCL6, MUM1, and C-MYC, and negative for CD5, CD10, CD30,  and EBER ISH.   non-germinal center (activated B cell) immunophenotype.  Co-expression of BCL2 and C-MYC by IHC may be associated with a more aggressive clinical course.   Lymphoma FISH panel shows no evidence of c-Myc rearrangement ruling out double hit or triple hit lymphoma. Patient presented with extensive abdominal and retroperitoneal lymphadenopathy, right supraclavicular lymphadenopathy and lung nodules concerning for lymphoma involvement as well as concern for small bowel involvement. 2) pulmonary nodules concerning for lymphoma involvement 3) 4.4 cm small bowel thickening concerning for involvement by lymphoma.  Cannot rule out adenocarcinoma but less likely. 4) acute kidney injury.  Patient's creatinine had bumped to 1.6 but on repeat is back down to normal. 5) Food intolerances including gluten and dairy avoidance.  PLAN -We discussed lab results as noted above which are stable. -Patient has no significant toxicities from cycle 3 of treatment (1st cycle with da EPOCH-R) -Patient's CT scan results were discussed as above.  He has had excellent response after 3 cycles of treatment. -We will plan to proceed with cycle 4 of treatment with dose adjusted EPOCH-R with same doses without dose escalation.  Follow-up note: Please schedule for next cycle of dose adjusted [facility] as inpatient starting 10/02/2021 for 5 days Outpatient Udenyca and Rituxan on 10/09/2021 Return to clinic with Dr. Irene Limbo with port flush and labs on 10/16/2021   The total time spent in the appointment was 32 minutes*.  All of the patient's questions were answered with apparent satisfaction. The patient knows to call the clinic with any problems, questions or concerns.   Sullivan Lone MD MS AAHIVMS [facility] Hematology/Oncology Physician [facility]  .*Total Encounter Time as defined by the Centers for Medicare and Medicaid  Services includes, in addition to the face-to-face time of a patient visit (documented in the note above) non-face-to-face time: obtaining and reviewing outside history, ordering and reviewing medications, tests or procedures, care coordination (communications with other health care professionals or caregivers) and documentation in the medical record.

## 2021-10-02 ENCOUNTER — Other Ambulatory Visit: Payer: Self-pay

## 2021-10-02 ENCOUNTER — Encounter (HOSPITAL_COMMUNITY): Payer: Self-pay | Admitting: Hematology

## 2021-10-02 ENCOUNTER — Inpatient Hospital Stay (HOSPITAL_COMMUNITY)
Admission: RE | Admit: 2021-10-02 | Discharge: 2021-10-06 | DRG: 847 | Disposition: A | Discharge status: Home / Self Care | Payer: No Typology Code available for payment source | Source: Ambulatory Visit | Attending: Hematology | Admitting: Hematology

## 2021-10-02 DIAGNOSIS — Z85828 Personal history of other malignant neoplasm of skin: Secondary | ICD-10-CM

## 2021-10-02 DIAGNOSIS — C8338 Diffuse large B-cell lymphoma, lymph nodes of multiple sites: Secondary | ICD-10-CM | POA: Diagnosis present

## 2021-10-02 DIAGNOSIS — Q278 Other specified congenital malformations of peripheral vascular system: Secondary | ICD-10-CM

## 2021-10-02 DIAGNOSIS — E739 Lactose intolerance, unspecified: Secondary | ICD-10-CM | POA: Diagnosis present

## 2021-10-02 DIAGNOSIS — N179 Acute kidney failure, unspecified: Secondary | ICD-10-CM | POA: Diagnosis present

## 2021-10-02 DIAGNOSIS — T380X5A Adverse effect of glucocorticoids and synthetic analogues, initial encounter: Secondary | ICD-10-CM | POA: Diagnosis not present

## 2021-10-02 DIAGNOSIS — Z833 Family history of diabetes mellitus: Secondary | ICD-10-CM | POA: Diagnosis not present

## 2021-10-02 DIAGNOSIS — Z79899 Other long term (current) drug therapy: Secondary | ICD-10-CM

## 2021-10-02 DIAGNOSIS — Z5111 Encounter for antineoplastic chemotherapy: Secondary | ICD-10-CM | POA: Diagnosis not present

## 2021-10-02 DIAGNOSIS — K5900 Constipation, unspecified: Secondary | ICD-10-CM | POA: Diagnosis present

## 2021-10-02 DIAGNOSIS — Z8371 Family history of colonic polyps: Secondary | ICD-10-CM | POA: Diagnosis not present

## 2021-10-02 DIAGNOSIS — R918 Other nonspecific abnormal finding of lung field: Secondary | ICD-10-CM | POA: Diagnosis present

## 2021-10-02 DIAGNOSIS — E876 Hypokalemia: Secondary | ICD-10-CM | POA: Diagnosis not present

## 2021-10-02 DIAGNOSIS — Z20822 Contact with and (suspected) exposure to covid-19: Secondary | ICD-10-CM | POA: Diagnosis present

## 2021-10-02 DIAGNOSIS — C8580 Other specified types of non-Hodgkin lymphoma, unspecified site: Secondary | ICD-10-CM

## 2021-10-02 DIAGNOSIS — Z803 Family history of malignant neoplasm of breast: Secondary | ICD-10-CM

## 2021-10-02 DIAGNOSIS — D72829 Elevated white blood cell count, unspecified: Secondary | ICD-10-CM | POA: Diagnosis not present

## 2021-10-02 DIAGNOSIS — Z8042 Family history of malignant neoplasm of prostate: Secondary | ICD-10-CM

## 2021-10-02 DIAGNOSIS — Z820 Family history of epilepsy and other diseases of the nervous system: Secondary | ICD-10-CM

## 2021-10-02 DIAGNOSIS — Z7189 Other specified counseling: Secondary | ICD-10-CM

## 2021-10-02 DIAGNOSIS — Z800 Family history of malignant neoplasm of digestive organs: Secondary | ICD-10-CM

## 2021-10-02 LAB — CBC WITH DIFFERENTIAL/PLATELET
Abs Immature Granulocytes: 0.31 10*3/uL — ABNORMAL HIGH (ref 0.00–0.07)
Basophils Absolute: 0.2 10*3/uL — ABNORMAL HIGH (ref 0.0–0.1)
Basophils Relative: 1 %
Eosinophils Absolute: 0.1 10*3/uL (ref 0.0–0.5)
Eosinophils Relative: 1 %
HCT: 38.1 % — ABNORMAL LOW (ref 39.0–52.0)
Hemoglobin: 12.6 g/dL — ABNORMAL LOW (ref 13.0–17.0)
Immature Granulocytes: 2 %
Lymphocytes Relative: 11 %
Lymphs Abs: 1.5 10*3/uL (ref 0.7–4.0)
MCH: 30.1 pg (ref 26.0–34.0)
MCHC: 33.1 g/dL (ref 30.0–36.0)
MCV: 90.9 fL (ref 80.0–100.0)
Monocytes Absolute: 0.9 10*3/uL (ref 0.1–1.0)
Monocytes Relative: 7 %
Neutro Abs: 10.2 10*3/uL — ABNORMAL HIGH (ref 1.7–7.7)
Neutrophils Relative %: 78 %
Platelets: 206 10*3/uL (ref 150–400)
RBC: 4.19 MIL/uL — ABNORMAL LOW (ref 4.22–5.81)
RDW: 17.9 % — ABNORMAL HIGH (ref 11.5–15.5)
WBC: 13.1 10*3/uL — ABNORMAL HIGH (ref 4.0–10.5)
nRBC: 0 % (ref 0.0–0.2)

## 2021-10-02 LAB — COMPREHENSIVE METABOLIC PANEL
ALT: 16 U/L (ref 0–44)
AST: 15 U/L (ref 15–41)
Albumin: 4.4 g/dL (ref 3.5–5.0)
Alkaline Phosphatase: 61 U/L (ref 38–126)
Anion gap: 6 (ref 5–15)
BUN: 13 mg/dL (ref 6–20)
CO2: 26 mmol/L (ref 22–32)
Calcium: 9.2 mg/dL (ref 8.9–10.3)
Chloride: 103 mmol/L (ref 98–111)
Creatinine, Ser: 0.72 mg/dL (ref 0.61–1.24)
GFR, Estimated: >60 mL/min (ref >60)
Glucose, Bld: 106 mg/dL — ABNORMAL HIGH (ref 70–99)
Potassium: 4 mmol/L (ref 3.5–5.1)
Sodium: 135 mmol/L (ref 135–145)
Total Bilirubin: 0.2 mg/dL — ABNORMAL LOW (ref 0.3–1.2)
Total Protein: 6.8 g/dL (ref 6.5–8.1)

## 2021-10-02 LAB — LACTATE DEHYDROGENASE: LDH: 158 U/L (ref 98–192)

## 2021-10-02 LAB — RESP PANEL BY RT-PCR (FLU A&B, COVID) ARPGX2
Influenza A by PCR: NEGATIVE
Influenza B by PCR: NEGATIVE
SARS Coronavirus 2 by RT PCR: NEGATIVE

## 2021-10-02 MED ORDER — PREDNISONE 20 MG PO TABS
60.0000 mg | ORAL_TABLET | Freq: Every day | ORAL | Status: AC
  Administered 2021-10-02 – 2021-10-06 (×5): 60 mg via ORAL
  Filled 2021-10-02 (×5): qty 3

## 2021-10-02 MED ORDER — SODIUM BICARBONATE/SODIUM CHLORIDE MOUTHWASH
OROMUCOSAL | Status: DC | PRN
  Filled 2021-10-02: qty 1000

## 2021-10-02 MED ORDER — LORAZEPAM 0.5 MG PO TABS
0.5000 mg | ORAL_TABLET | Freq: Four times a day (QID) | ORAL | Status: DC | PRN
  Administered 2021-10-02 – 2021-10-06 (×10): 0.5 mg via ORAL
  Filled 2021-10-02 (×10): qty 1

## 2021-10-02 MED ORDER — ENOXAPARIN SODIUM 40 MG/0.4ML IJ SOSY: 40.0000 mg | PREFILLED_SYRINGE | INTRAMUSCULAR | Status: DC

## 2021-10-02 MED ORDER — SODIUM CHLORIDE 0.9% FLUSH
10.0000 mL | Freq: Two times a day (BID) | INTRAVENOUS | Status: DC
  Administered 2021-10-02 – 2021-10-06 (×3): 10 mL

## 2021-10-02 MED ORDER — SODIUM CHLORIDE 0.9 % IV SOLN
Freq: Once | INTRAVENOUS | Status: AC
  Administered 2021-10-02: 18 mg via INTRAVENOUS
  Filled 2021-10-02: qty 4

## 2021-10-02 MED ORDER — COLD PACK MISC ONCOLOGY
1.0000 | Freq: Once | Status: DC | PRN
  Filled 2021-10-02: qty 1

## 2021-10-02 MED ORDER — VINCRISTINE SULFATE CHEMO INJECTION 1 MG/ML
Freq: Once | INTRAVENOUS | Status: AC
  Filled 2021-10-02: qty 10

## 2021-10-02 MED ORDER — MAGIC MOUTHWASH W/LIDOCAINE
5.0000 mL | Freq: Four times a day (QID) | ORAL | Status: DC | PRN
  Filled 2021-10-02: qty 5

## 2021-10-02 MED ORDER — ACETAMINOPHEN 325 MG PO TABS: 650.0000 mg | ORAL_TABLET | ORAL | Status: DC | PRN

## 2021-10-02 MED ORDER — SODIUM CHLORIDE 0.9 % IV SOLN: INTRAVENOUS | Status: DC

## 2021-10-02 MED ORDER — HOT PACK MISC ONCOLOGY
1.0000 | Freq: Once | Status: DC | PRN
  Filled 2021-10-02: qty 1

## 2021-10-02 MED ORDER — PANTOPRAZOLE SODIUM 40 MG PO TBEC
40.0000 mg | DELAYED_RELEASE_TABLET | Freq: Every day | ORAL | Status: DC
  Administered 2021-10-03 – 2021-10-06 (×4): 40 mg via ORAL
  Filled 2021-10-02 (×4): qty 1

## 2021-10-02 MED ORDER — LIDOCAINE-PRILOCAINE 2.5-2.5 % EX CREA: 1.0000 "application " | TOPICAL_CREAM | Freq: Every day | CUTANEOUS | Status: DC | PRN

## 2021-10-02 MED ORDER — SENNOSIDES-DOCUSATE SODIUM 8.6-50 MG PO TABS: 1.0000 | ORAL_TABLET | Freq: Every evening | ORAL | Status: DC | PRN

## 2021-10-02 MED ORDER — LORATADINE 10 MG PO TABS: 10.0000 mg | ORAL_TABLET | Freq: Every day | ORAL | Status: DC | PRN

## 2021-10-02 MED ORDER — HYDROCODONE-ACETAMINOPHEN 5-325 MG PO TABS: 1.0000 | ORAL_TABLET | Freq: Four times a day (QID) | ORAL | Status: DC | PRN

## 2021-10-02 MED ORDER — PROCHLORPERAZINE MALEATE 10 MG PO TABS: 10.0000 mg | ORAL_TABLET | Freq: Four times a day (QID) | ORAL | Status: DC | PRN

## 2021-10-02 MED ORDER — SODIUM CHLORIDE 0.9% FLUSH: 10.0000 mL | INTRAVENOUS | Status: DC | PRN

## 2021-10-02 MED ORDER — CHLORHEXIDINE GLUCONATE CLOTH 2 % EX PADS: 6.0000 | MEDICATED_PAD | Freq: Every day | CUTANEOUS | Status: DC

## 2021-10-02 NOTE — H&P (Addendum)
[place]  [phone] Fax:([phone]) [phone]   MEDICAL ONCOLOGY - ADMISSION H&P  Reason for Admission: Diffuse large B-cell lymphoma, chemotherapy administration  HPI: [name] is a 41 year old male with diffuse large B-cell lymphoma.  He initially received 2 cycles of R-CHOP and has tolerated well.  Although patient does not have double hit lymphoma, recommendation was made to switch to [facility] beginning with cycle #3.  He received his first cycle of  EPOCH-R beginning on 09/11/2021.  He tolerated this very well.  Since his last cycle of chemotherapy, he had a PET scan performed on 09/21/2021 which showed a significant response to treatment.  The patient reports that he is feeling well today.  He is not having any fevers, chills, mucositis, chest pain, shortness of breath, abdominal pain, nausea, vomiting, constipation, diarrhea. The patient presents to the hospital today for admission for cycle #4 of chemotherapy.  Past Medical History:  Diagnosis Date   Allergy    Cancer ([place])    skin cancer   Herniated disc    Hx of skin cancer, basal cell    Hyperlipidemia    borderline- no meds   :   Past Surgical History:  Procedure Laterality Date   COLONOSCOPY     last 2013   ESOPHAGOGASTRODUODENOSCOPY (EGD) WITH PROPOFOL N/A 06/29/2021   Procedure: ESOPHAGOGASTRODUODENOSCOPY (EGD) WITH PROPOFOL;  Surgeon: Milus Banister, MD;  Location: [facility];  Service: Endoscopy;  Laterality: N/A;   EUS N/A 06/29/2021   Procedure: UPPER ENDOSCOPIC ULTRASOUND (EUS) RADIAL;  Surgeon: Milus Banister, MD;  Location: [facility];  Service: Endoscopy;  Laterality: N/A;   FINE NEEDLE ASPIRATION N/A 06/29/2021   Procedure: FINE NEEDLE ASPIRATION (FNA) LINEAR;  Surgeon: Milus Banister, MD;  Location: [facility];  Service: Endoscopy;  Laterality: N/A;   IR IMAGING GUIDED PORT INSERTION  07/26/2021   VASECTOMY    :   No current facility-administered medications for  this encounter.      Allergies  Allergen Reactions   Bee Pollen-1000-Royal Jelly [Nutritional Supplements] Anaphylaxis    And Bee's Wax - inflammatory reaction per pt    Gluten Meal Other (See Comments)    Abdominal cramping    Lactose Intolerance (Gi) Other (See Comments)    Abdominal cramping  :   Family History  Problem Relation Age of Onset   Colon cancer Mother 68   Colon polyps Mother    Colon cancer Paternal Grandfather    Alzheimer's disease Paternal Grandfather    Breast cancer Maternal Aunt    Diabetes Maternal Grandmother    Prostate cancer Maternal Grandfather    Esophageal cancer Neg Hx    Rectal cancer Neg Hx    Stomach cancer Neg Hx   :   Social History   Socioeconomic History   Marital status: Married    Spouse name: Not on file   Number of children: 1   Years of education: Not on file   Highest education level: Not on file  Occupational History   Occupation: Sales   Tobacco Use   Smoking status: Never   Smokeless tobacco: Never  Substance and Sexual Activity   Alcohol use: Not Currently    Alcohol/week: 1.0 standard drink    Types: 1 Glasses of wine per week    Comment: Rare    Drug use: No   Sexual activity: Not on file  Other Topics Concern   Not on file  Social History Narrative   Daily caffeine  Social Determinants of Health   Financial Resource Strain: Not on file  Food Insecurity: Not on file  Transportation Needs: Not on file  Physical Activity: Not on file  Stress: Not on file  Social Connections: Not on file  Intimate Partner Violence: Not on file  :  Review of Systems: A comprehensive 14 point review of systems was negative except as noted in the HPI.  Exam: Patient Vitals for the past 24 hrs:  BP Temp Temp src Pulse SpO2 Weight  10/02/21 1127 116/77 97.8 F (36.6 C) Oral 91 98 % 82.3 kg    General:  well-nourished in no acute distress.   Eyes:  no scleral icterus.   ENT:  There were no oropharyngeal lesions.    Lymphatics:  Negative cervical, supraclavicular or axillary adenopathy.   Respiratory: lungs were clear bilaterally without wheezing or crackles.   Cardiovascular:  Regular rate and rhythm, S1/S2, without murmur, rub or gallop.  There was no pedal edema.   GI:  abdomen was soft, flat, nontender, nondistended, without organomegaly.   Skin exam was without echymosis, petichae.   Neuro exam was nonfocal. Patient was alert and oriented.  Attention was good.   Language was appropriate.  Mood was normal without depression.  Speech was not pressured.  Thought content was not tangential.     Lab Results  Component Value Date   WBC 16.8 (H) 09/25/2021   HGB 10.9 (L) 09/25/2021   HCT 32.4 (L) 09/25/2021   PLT 191 09/25/2021   GLUCOSE 100 (H) 09/25/2021   ALT 23 09/25/2021   AST 14 (L) 09/25/2021   NA 139 09/25/2021   K 3.8 09/25/2021   CL 106 09/25/2021   CREATININE 0.79 09/25/2021   BUN 13 09/25/2021   CO2 27 09/25/2021    NM PET Image Restag (PS) Skull Base To Thigh  Result Date: 09/22/2021 CLINICAL DATA:  Subsequent treatment strategy for lymphoma. EXAM: NUCLEAR MEDICINE PET SKULL BASE TO THIGH TECHNIQUE: 9.1 mCi F-18 FDG was injected intravenously. Full-ring PET imaging was performed from the skull base to thigh after the radiotracer. CT data was obtained and used for attenuation correction and anatomic localization. Fasting blood glucose: 85 mg/dl COMPARISON:  07/04/2021 FINDINGS: Mediastinal blood pool activity: SUV max 2.2 Liver activity: SUV max 3.2 NECK: Mild asymmetric activity along the vicinity of the right palatine tonsil with maximum SUV 3 point 4, contralateral side with maximum SUV 4.2. Previously the right palatine tonsil region had a maximum SUV of 5.6. No discernible mass on the CT data. This may well be physiologic. A previous 0.6 cm right level V lymph node with a maximum SUV of 4.1 shown on the prior exam has completely resolved (Deauville 1). Incidental CT findings: Mild  chronic bilateral maxillary sinusitis. CHEST: Previously hypermetabolic paratracheal, subcarinal, and left paraesophageal adenopathy has completely resolved (Deauville 1). 0.5 cm right supraclavicular lymph node with maximum SUV 1.7 on image 55 series 4 (Deauville 2), previously 1.2 cm with maximum SUV 6.3. Accentuated distal esophageal activity without CT abnormality, maximum SUV 4.4, probably physiologic. Incidental CT findings: Left Port-A-Cath tip: Right atrium. Aberrant right subclavian artery passes behind the esophagus. Mild linear scarring in the right lower lobe. Previous sub solid nodularity in the right upper lobe has resolved. ABDOMEN/PELVIS: The previous bulky adenopathy in the mesentery and retroperitoneum has mostly resolved, with stranding in the mesentery as is often encountered in the setting of successful treatment in lymphoma. Index lymph node anterior to the left adrenal gland measuring 0.6  cm in short axis on image 124 series 4 has a maximum SUV of 2.1 (Deauville 2), previously measuring 1.7 cm with maximum SUV 7.3. No new adenopathy in the abdomen/pelvis. The previously thickened and hypermetabolic loop of small bowel in the central pelvis shown on the prior exam currently appears normal (Deauville 1). Incidental CT findings: Prominent stool throughout the colon favors constipation. SKELETON: Diffuse accentuated metabolic activity in the skeleton. Incidental CT findings: none IMPRESSION: 1. Marked interval reduction in size and activity of prior adenopathy in the and abdomen. Much of the prior adenopathy has completely resolved (Deauville 1), with some small remaining lymph nodes demonstrating Deauville 2 levels of activity. The previous abnormal hypermetabolic small bowel wall thickening in the pelvis has completely resolved. 2. Diffuse accentuated metabolic activity in the skeleton, probably attributable to granulocyte stimulation. 3. Other imaging findings of potential clinical  significance: Mild chronic bilateral maxillary sinusitis. Aberrant right subclavian artery. Prominent stool throughout the colon favors constipation. Electronically Signed   By: Van Clines M.D.   On: 09/22/2021 09:35     NM PET Image Restag (PS) Skull Base To Thigh  Result Date: 09/22/2021 CLINICAL DATA:  Subsequent treatment strategy for lymphoma. EXAM: NUCLEAR MEDICINE PET SKULL BASE TO THIGH TECHNIQUE: 9.1 mCi F-18 FDG was injected intravenously. Full-ring PET imaging was performed from the skull base to thigh after the radiotracer. CT data was obtained and used for attenuation correction and anatomic localization. Fasting blood glucose: 85 mg/dl COMPARISON:  07/04/2021 FINDINGS: Mediastinal blood pool activity: SUV max 2.2 Liver activity: SUV max 3.2 NECK: Mild asymmetric activity along the vicinity of the right palatine tonsil with maximum SUV 3 point 4, contralateral side with maximum SUV 4.2. Previously the right palatine tonsil region had a maximum SUV of 5.6. No discernible mass on the CT data. This may well be physiologic. A previous 0.6 cm right level V lymph node with a maximum SUV of 4.1 shown on the prior exam has completely resolved (Deauville 1). Incidental CT findings: Mild chronic bilateral maxillary sinusitis. CHEST: Previously hypermetabolic paratracheal, subcarinal, and left paraesophageal adenopathy has completely resolved (Deauville 1). 0.5 cm right supraclavicular lymph node with maximum SUV 1.7 on image 55 series 4 (Deauville 2), previously 1.2 cm with maximum SUV 6.3. Accentuated distal esophageal activity without CT abnormality, maximum SUV 4.4, probably physiologic. Incidental CT findings: Left Port-A-Cath tip: Right atrium. Aberrant right subclavian artery passes behind the esophagus. Mild linear scarring in the right lower lobe. Previous sub solid nodularity in the right upper lobe has resolved. ABDOMEN/PELVIS: The previous bulky adenopathy in the mesentery and  retroperitoneum has mostly resolved, with stranding in the mesentery as is often encountered in the setting of successful treatment in lymphoma. Index lymph node anterior to the left adrenal gland measuring 0.6 cm in short axis on image 124 series 4 has a maximum SUV of 2.1 (Deauville 2), previously measuring 1.7 cm with maximum SUV 7.3. No new adenopathy in the abdomen/pelvis. The previously thickened and hypermetabolic loop of small bowel in the central pelvis shown on the prior exam currently appears normal (Deauville 1). Incidental CT findings: Prominent stool throughout the colon favors constipation. SKELETON: Diffuse accentuated metabolic activity in the skeleton. Incidental CT findings: none IMPRESSION: 1. Marked interval reduction in size and activity of prior adenopathy in the and abdomen. Much of the prior adenopathy has completely resolved (Deauville 1), with some small remaining lymph nodes demonstrating Deauville 2 levels of activity. The previous abnormal hypermetabolic small bowel wall thickening in the  pelvis has completely resolved. 2. Diffuse accentuated metabolic activity in the skeleton, probably attributable to granulocyte stimulation. 3. 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 Prominent stool throughout the colon favors constipation. Electronically Signed   By: Van Clines M.D.   On: 09/22/2021 09:35    Assessment and Plan:   Very pleasant 41 year old gentleman with some chronic food related bowel irregularities with   1) recently diagnosed stage IVA large B-cell lymphoma. Lymphoma FISH panel shows no evidence of c-Myc rearrangement ruling out double hit or triple hit lymphoma. Patient presented with extensive abdominal and retroperitoneal lymphadenopathy, right supraclavicular lymphadenopathy and lung nodules concerning for lymphoma involvement as well as concern for small bowel  involvement. -PET scan 09/21/2021- "1. Marked interval reduction in size and activity of prior adenopathy in the and abdomen. Much of the prior adenopathy has completely resolved (Deauville 1), with some small remaining lymph nodes demonstrating Deauville 2 levels of activity. The previous abnormal hypermetabolic small bowel wall thickening in the pelvis has completely resolved. 2. Diffuse accentuated metabolic activity in the skeleton, probably attributable to granulocyte stimulation. 3. 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 Prominent stool throughout the colon favors constipation." 2) pulmonary nodules concerning for lymphoma involvement 3) 4.4 cm small bowel thickening concerning for involvement by lymphoma.  Cannot rule out adenocarcinoma but less likely. 4) acute kidney injury.  Patient's creatinine had bumped to 1.6 but on repeat is back down to normal. 5) Food intolerances including gluten and dairy avoidance.   PLAN -Baseline labs including CBC with differential, CMET, LDH have been drawn and reviewed.  Labs are adequate to proceed with chemotherapy today.  Will continue to check daily CBC with differential and CMET. -Baseline COVID-19 test has been ordered.  Results pending. -Discussed DVT prophylaxis with the patient.  He does not want prophylactic Lovenox dosing.  He indicates that he will ambulate as much as possible.  Lovenox was not ordered per his request. -Home medications including as needed Tylenol, as needed hydrocodone, and as needed Ativan have been ordered. -Protonix 40 mg daily has been ordered. -Senokot as as needed for constipation. -Rituxan and Udenyca will be on day 8 as outpatient-office to arrange these appointments   Allen Bussing, DNP, AGPCNP-BC, AOCNP     ADDENDUM  .Patient was Personally and independently interviewed, examined and relevant elements of the history  of present illness were reviewed in details and an assessment and plan was created. All elements of the patient's history of present illness , assessment and plan were discussed in details with Allen Bussing, DNP, AGPCNP-BC, AOCNP . The above documentation reflects our combined findings assessment and plan.   Sullivan Lone MD MS

## 2021-10-03 ENCOUNTER — Encounter: Payer: Self-pay | Admitting: Hematology

## 2021-10-03 DIAGNOSIS — C8338 Diffuse large B-cell lymphoma, lymph nodes of multiple sites: Secondary | ICD-10-CM | POA: Diagnosis not present

## 2021-10-03 LAB — COMPREHENSIVE METABOLIC PANEL
ALT: 18 U/L (ref 0–44)
AST: 17 U/L (ref 15–41)
Albumin: 4 g/dL (ref 3.5–5.0)
Alkaline Phosphatase: 55 U/L (ref 38–126)
Anion gap: 7 (ref 5–15)
BUN: 18 mg/dL (ref 6–20)
CO2: 23 mmol/L (ref 22–32)
Calcium: 9.4 mg/dL (ref 8.9–10.3)
Chloride: 106 mmol/L (ref 98–111)
Creatinine, Ser: 0.78 mg/dL (ref 0.61–1.24)
GFR, Estimated: >60 mL/min (ref >60)
Glucose, Bld: 123 mg/dL — ABNORMAL HIGH (ref 70–99)
Potassium: 3.9 mmol/L (ref 3.5–5.1)
Sodium: 136 mmol/L (ref 135–145)
Total Bilirubin: 0.4 mg/dL (ref 0.3–1.2)
Total Protein: 6.6 g/dL (ref 6.5–8.1)

## 2021-10-03 LAB — CBC WITH DIFFERENTIAL/PLATELET
Abs Immature Granulocytes: 0.46 10*3/uL — ABNORMAL HIGH (ref 0.00–0.07)
Basophils Absolute: 0.1 10*3/uL (ref 0.0–0.1)
Basophils Relative: 0 %
Eosinophils Absolute: 0 10*3/uL (ref 0.0–0.5)
Eosinophils Relative: 0 %
HCT: 36.7 % — ABNORMAL LOW (ref 39.0–52.0)
Hemoglobin: 12.3 g/dL — ABNORMAL LOW (ref 13.0–17.0)
Immature Granulocytes: 2 %
Lymphocytes Relative: 4 %
Lymphs Abs: 1.1 10*3/uL (ref 0.7–4.0)
MCH: 30.2 pg (ref 26.0–34.0)
MCHC: 33.5 g/dL (ref 30.0–36.0)
MCV: 90.2 fL (ref 80.0–100.0)
Monocytes Absolute: 0.8 10*3/uL (ref 0.1–1.0)
Monocytes Relative: 3 %
Neutro Abs: 22.1 10*3/uL — ABNORMAL HIGH (ref 1.7–7.7)
Neutrophils Relative %: 91 %
Platelets: 206 10*3/uL (ref 150–400)
RBC: 4.07 MIL/uL — ABNORMAL LOW (ref 4.22–5.81)
RDW: 17.4 % — ABNORMAL HIGH (ref 11.5–15.5)
WBC: 24.4 10*3/uL — ABNORMAL HIGH (ref 4.0–10.5)
nRBC: 0 % (ref 0.0–0.2)

## 2021-10-03 MED ORDER — SENNOSIDES-DOCUSATE SODIUM 8.6-50 MG PO TABS
1.0000 | ORAL_TABLET | Freq: Every day | ORAL | Status: DC
  Administered 2021-10-03 – 2021-10-06 (×4): 1 via ORAL
  Filled 2021-10-03 (×4): qty 1

## 2021-10-03 MED ORDER — SODIUM CHLORIDE 0.9 % IV SOLN
Freq: Once | INTRAVENOUS | Status: AC
  Administered 2021-10-03: 18 mg via INTRAVENOUS
  Filled 2021-10-03: qty 4

## 2021-10-03 MED ORDER — CHLORHEXIDINE GLUCONATE CLOTH 2 % EX PADS
6.0000 | MEDICATED_PAD | Freq: Every day | CUTANEOUS | Status: DC
  Administered 2021-10-04 – 2021-10-06 (×3): 6 via TOPICAL

## 2021-10-03 MED ORDER — VINCRISTINE SULFATE CHEMO INJECTION 1 MG/ML
Freq: Once | INTRAVENOUS | Status: AC
  Filled 2021-10-03: qty 10

## 2021-10-03 NOTE — Progress Notes (Addendum)
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 Lymphoma FISH panel shows no evidence of c-Myc rearrangement ruling out double hit or triple hit lymphoma. Patient presented with extensive abdominal and retroperitoneal lymphadenopathy, right supraclavicular lymphadenopathy and lung nodules concerning for lymphoma involvement as well as concern for small bowel involvement. -PET scan 09/21/2021- "1. Marked interval reduction in size and activity of prior adenopathy in the and abdomen. Much of the prior adenopathy has completely resolved (Deauville 1), with some small remaining lymph nodes demonstrating Deauville 2 levels of activity. The previous abnormal hypermetabolic small bowel wall thickening in the pelvis has completely resolved. 2. Diffuse accentuated metabolic activity in the skeleton, probably attributable to granulocyte stimulation. 3. 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 Prominent stool throughout the colon favors constipation." 2) pulmonary nodules concerning for lymphoma involvement 3) 4.4 cm small bowel thickening concerning for involvement by lymphoma.  Cannot rule out adenocarcinoma but less likely. 4) acute kidney injury.  Patient's creatinine had bumped to 1.6 but on repeat is back down to normal. 5) Food intolerances including gluten and dairy avoidance.   PLAN -Labs from this morning have been reviewed and WBC is more elevated likely due to steroids, hemoglobin stable, and platelets are normal.  CMET shows normal renal and liver function.  Labs adequate to continue with day 2 of chemotherapy as planned.  We will continue to check CBC with differential and CMET daily. -Discussed DVT prophylaxis with the patient.  He does not want  prophylactic Lovenox dosing.  He indicates that he will ambulate as much as possible.  Lovenox was not ordered per his request. -Continue home medications and Protonix 40 mg daily. -Adjusted Senokot-S dose to be scheduled 1 tablet daily. -Rituxan and Udenyca are scheduled already at the cancer center on day 8.  Mikey Bussing, DNP, AGPCNP-BC, AOCNP  SUBJECTIVE: Feels well this morning.  No specific complaints.  Tolerating chemotherapy well so far.  He is not having any nausea or vomiting.  Reports some very mild constipation.   Oncology History  Diffuse large B-cell lymphoma of lymph nodes of multiple regions ([place])  07/21/2021 Initial Diagnosis   Diffuse large B-cell lymphoma of lymph nodes of multiple regions ([place])   07/27/2021 - 08/19/2021 Chemotherapy   Patient is on Treatment Plan : NON-HODGKINS LYMPHOMA R-CHOP q21d     09/04/2021 Cancer Staging   Staging form: Hodgkin and Non-Hodgkin Lymphoma, AJCC 8th Edition - Clinical: Stage IV (Diffuse large B-cell lymphoma) - Signed by Brunetta Genera, MD on 09/04/2021 Stage prefix: Initial diagnosis    09/11/2021 -  Chemotherapy   Patient is on Treatment Plan : IP NON-HODGKINS LYMPHOMA EPOCH q21d     09/18/2021 -  Chemotherapy   Patient is on Treatment Plan : NON-HODGKINS LYMPHOMA Rituximab q21d        REVIEW OF SYSTEMS:   Review of Systems  Constitutional: Negative.   HENT: Negative.    Eyes: Negative.   Respiratory: Negative.    Cardiovascular: Negative.   Gastrointestinal:  Positive for constipation. Negative for nausea and vomiting.       Reports mild constipation this morning  Genitourinary: Negative.   Musculoskeletal: Negative.   Skin: Negative.   Neurological: Negative.   Psychiatric/Behavioral: Negative.     I have reviewed the past medical history, past surgical history, social history and family  history with the patient and they are unchanged from previous note.   PHYSICAL EXAMINATION: ECOG PERFORMANCE STATUS: 1 -  Symptomatic but completely ambulatory  Vitals:   10/02/21 2328 10/03/21 0503  BP: (!) 91/57 105/77  Pulse: 99 91  Resp: 16 16  Temp: 98.1 F (36.7 C) 97.6 F (36.4 C)  SpO2: 96% 97%   Filed Weights   10/02/21 1127  Weight: 82.3 kg    Intake/Output from previous day: 02/20 0701 - 02/21 0700 In: 1158.6 [P.O.:240; I.V.:277.1; IV Piggyback:641.5] Out: -   Physical Exam Vitals reviewed.  Constitutional:      General: He is not in acute distress. HENT:     Head: Normocephalic.     Mouth/Throat:     Mouth: Mucous membranes are moist.     Pharynx: No oropharyngeal exudate.  Eyes:     General: No scleral icterus.    Conjunctiva/sclera: Conjunctivae normal.  Cardiovascular:     Rate and Rhythm: Normal rate and regular rhythm.  Pulmonary:     Effort: Pulmonary effort is normal.     Breath sounds: Normal breath sounds.  Abdominal:     General: Bowel sounds are normal.     Palpations: Abdomen is soft.     Tenderness: There is no abdominal tenderness.  Musculoskeletal:     Right lower leg: No edema.     Left lower leg: No edema.  Skin:    General: Skin is warm and dry.  Neurological:     Mental Status: He is alert and oriented to person, place, and time.  Psychiatric:        Mood and Affect: Mood normal.        Behavior: Behavior normal.        Thought Content: Thought content normal.        Judgment: Judgment normal.    LABORATORY DATA:  I have reviewed the data as listed CMP Latest Ref Rng & Units 10/02/2021 09/25/2021 09/18/2021  Glucose 70 - 99 mg/dL 106(H) 100(H) 93  BUN 6 - 20 mg/dL 13 13 14   Creatinine 0.61 - 1.24 mg/dL 0.72 0.79 0.64  Sodium 135 - 145 mmol/L 135 139 138  Potassium 3.5 - 5.1 mmol/L 4.0 3.8 3.6  Chloride 98 - 111 mmol/L 103 106 102  CO2 22 - 32 mmol/L 26 27 29   Calcium 8.9 - 10.3 mg/dL 9.2 9.2 9.2  Total Protein 6.5 - 8.1 g/dL 6.8 6.1(L) 6.0(L)  Total Bilirubin 0.3 - 1.2 mg/dL 0.2(L) 0.2(L) 0.8  Alkaline Phos 38 - 126 U/L 61 58 36(L)  AST 15  - 41 U/L 15 14(L) 7(L)  ALT 0 - 44 U/L 16 23 10     Lab Results  Component Value Date   WBC 13.1 (H) 10/02/2021   HGB 12.6 (L) 10/02/2021   HCT 38.1 (L) 10/02/2021   MCV 90.9 10/02/2021   PLT 206 10/02/2021   NEUTROABS 10.2 (H) 10/02/2021    No results found for: CEA1, CEA, FYB017, CA125, PSA1  NM PET Image Restag (PS) Skull Base To Thigh  Result Date: 09/22/2021 CLINICAL DATA:  Subsequent treatment strategy for lymphoma. EXAM: NUCLEAR MEDICINE PET SKULL BASE TO THIGH TECHNIQUE: 9.1 mCi F-18 FDG was injected intravenously. Full-ring PET imaging was performed from the skull base to thigh after the radiotracer. CT data was obtained and used for attenuation correction and anatomic localization. Fasting blood glucose: 85 mg/dl COMPARISON:  07/04/2021 FINDINGS: Mediastinal blood pool activity: SUV max 2.2 Liver activity: SUV max 3.2 NECK: Mild  asymmetric activity along the vicinity of the right palatine tonsil with maximum SUV 3 point 4, contralateral side with maximum SUV 4.2. Previously the right palatine tonsil region had a maximum SUV of 5.6. No discernible mass on the CT data. This may well be physiologic. A previous 0.6 cm right level V lymph node with a maximum SUV of 4.1 shown on the prior exam has completely resolved (Deauville 1). Incidental CT findings: Mild chronic bilateral maxillary sinusitis. CHEST: Previously hypermetabolic paratracheal, subcarinal, and left paraesophageal adenopathy has completely resolved (Deauville 1). 0.5 cm right supraclavicular lymph node with maximum SUV 1.7 on image 55 series 4 (Deauville 2), previously 1.2 cm with maximum SUV 6.3. Accentuated distal esophageal activity without CT abnormality, maximum SUV 4.4, probably physiologic. Incidental CT findings: Left Port-A-Cath tip: Right atrium. Aberrant right subclavian artery passes behind the esophagus. Mild linear scarring in the right lower lobe. Previous sub solid nodularity in the right upper lobe has resolved.  ABDOMEN/PELVIS: The previous bulky adenopathy in the mesentery and retroperitoneum has mostly resolved, with stranding in the mesentery as is often encountered in the setting of successful treatment in lymphoma. Index lymph node anterior to the left adrenal gland measuring 0.6 cm in short axis on image 124 series 4 has a maximum SUV of 2.1 (Deauville 2), previously measuring 1.7 cm with maximum SUV 7.3. No new adenopathy in the abdomen/pelvis. The previously thickened and hypermetabolic loop of small bowel in the central pelvis shown on the prior exam currently appears normal (Deauville 1). Incidental CT findings: Prominent stool throughout the colon favors constipation. SKELETON: Diffuse accentuated metabolic activity in the skeleton. Incidental CT findings: none IMPRESSION: 1. Marked interval reduction in size and activity of prior adenopathy in the and abdomen. Much of the prior adenopathy has completely resolved (Deauville 1), with some small remaining lymph nodes demonstrating Deauville 2 levels of activity. The previous abnormal hypermetabolic small bowel wall thickening in the pelvis has completely resolved. 2. Diffuse accentuated metabolic activity in the skeleton, probably attributable to granulocyte stimulation. 3. 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 Prominent stool throughout the colon favors constipation. Electronically Signed   By: Van Clines M.D.   On: 09/22/2021 09:35     Future Appointments  Date Time Provider [place]  10/09/2021  8:00 AM [facility] [place] FLUSH [facility] None  10/09/2021  9:00 AM [facility] INFUSION [facility] None  10/16/2021 10:00 AM [facility] [place] None  10/16/2021 10:40 AM Brunetta Genera, MD [facility] None  10/30/2021  9:15 AM [facility] [place] FLUSH [facility] None  10/30/2021 10:15 AM [facility] INFUSION [facility] None      LOS: 1 day     ADDENDUM  .Patient was Personally and independently interviewed, examined and relevant elements of the history of present illness were reviewed in details and an assessment and plan was created. All elements of the patient's history of present illness , assessment and plan were discussed in details with Mikey Bussing, DNP, AGPCNP-BC, AOCNP. The above documentation reflects our combined findings assessment and plan.   Sullivan Lone MD MS

## 2021-10-03 NOTE — Progress Notes (Signed)
°  Transition of Care [facility]) Screening Note   Patient Details  Name: Asael Pann Date of Birth: Nov 23, 1980   Transition of Care [facility]) CM/SW Contact:    Shayaan Parke, Marjie Skiff, RN Phone Number: 10/03/2021, 10:09 AM    Transition of Care Department [facility]) has reviewed patient and no TOC needs have been identified at this time. We will continue to monitor patient advancement through interdisciplinary progression rounds. If new patient transition needs arise, please place a TOC consult.

## 2021-10-04 ENCOUNTER — Encounter: Payer: Self-pay | Admitting: Hematology

## 2021-10-04 DIAGNOSIS — C8338 Diffuse large B-cell lymphoma, lymph nodes of multiple sites: Secondary | ICD-10-CM | POA: Diagnosis not present

## 2021-10-04 LAB — COMPREHENSIVE METABOLIC PANEL
ALT: 15 U/L (ref 0–44)
AST: 14 U/L — ABNORMAL LOW (ref 15–41)
Albumin: 3.7 g/dL (ref 3.5–5.0)
Alkaline Phosphatase: 45 U/L (ref 38–126)
Anion gap: 5 (ref 5–15)
BUN: 20 mg/dL (ref 6–20)
CO2: 24 mmol/L (ref 22–32)
Calcium: 8.9 mg/dL (ref 8.9–10.3)
Chloride: 107 mmol/L (ref 98–111)
Creatinine, Ser: 0.74 mg/dL (ref 0.61–1.24)
GFR, Estimated: >60 mL/min (ref >60)
Glucose, Bld: 123 mg/dL — ABNORMAL HIGH (ref 70–99)
Potassium: 3.7 mmol/L (ref 3.5–5.1)
Sodium: 136 mmol/L (ref 135–145)
Total Bilirubin: 0.2 mg/dL — ABNORMAL LOW (ref 0.3–1.2)
Total Protein: 6.1 g/dL — ABNORMAL LOW (ref 6.5–8.1)

## 2021-10-04 LAB — CBC WITH DIFFERENTIAL/PLATELET
Abs Immature Granulocytes: 0.2 10*3/uL — ABNORMAL HIGH (ref 0.00–0.07)
Basophils Absolute: 0 10*3/uL (ref 0.0–0.1)
Basophils Relative: 0 %
Eosinophils Absolute: 0 10*3/uL (ref 0.0–0.5)
Eosinophils Relative: 0 %
HCT: 34.9 % — ABNORMAL LOW (ref 39.0–52.0)
Hemoglobin: 11.5 g/dL — ABNORMAL LOW (ref 13.0–17.0)
Immature Granulocytes: 1 %
Lymphocytes Relative: 5 %
Lymphs Abs: 0.9 10*3/uL (ref 0.7–4.0)
MCH: 30.1 pg (ref 26.0–34.0)
MCHC: 33 g/dL (ref 30.0–36.0)
MCV: 91.4 fL (ref 80.0–100.0)
Monocytes Absolute: 1.3 10*3/uL — ABNORMAL HIGH (ref 0.1–1.0)
Monocytes Relative: 7 %
Neutro Abs: 15.8 10*3/uL — ABNORMAL HIGH (ref 1.7–7.7)
Neutrophils Relative %: 87 %
Platelets: 200 10*3/uL (ref 150–400)
RBC: 3.82 MIL/uL — ABNORMAL LOW (ref 4.22–5.81)
RDW: 17.8 % — ABNORMAL HIGH (ref 11.5–15.5)
WBC: 18.2 10*3/uL — ABNORMAL HIGH (ref 4.0–10.5)
nRBC: 0 % (ref 0.0–0.2)

## 2021-10-04 MED ORDER — SODIUM CHLORIDE 0.9 % IV SOLN
Freq: Once | INTRAVENOUS | Status: AC
  Administered 2021-10-04: 18 mg via INTRAVENOUS
  Filled 2021-10-04: qty 4

## 2021-10-04 MED ORDER — VINCRISTINE SULFATE CHEMO INJECTION 1 MG/ML
Freq: Once | INTRAVENOUS | Status: AC
  Filled 2021-10-04: qty 10

## 2021-10-04 NOTE — Progress Notes (Signed)
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 Lymphoma FISH panel shows no evidence of c-Myc rearrangement ruling out double hit or triple hit lymphoma. Patient presented with extensive abdominal and retroperitoneal lymphadenopathy, right supraclavicular lymphadenopathy and lung nodules concerning for lymphoma involvement as well as concern for small bowel involvement. -PET scan 09/21/2021- "1. Marked interval reduction in size and activity of prior adenopathy in the and abdomen. Much of the prior adenopathy has completely resolved (Deauville 1), with some small remaining lymph nodes demonstrating Deauville 2 levels of activity. The previous abnormal hypermetabolic small bowel wall thickening in the pelvis has completely resolved. 2. Diffuse accentuated metabolic activity in the skeleton, probably attributable to granulocyte stimulation. 3. 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 Prominent stool throughout the colon favors constipation." 2) pulmonary nodules concerning for lymphoma involvement 3) 4.4 cm small bowel thickening concerning for involvement by lymphoma.  Not visible on repeat PET scan after 3 cycles of treatment suggesting this is most likely lymphoma 4) acute kidney injury.  Patient's creatinine had bumped to 1.6 but on repeat is back down to normal. 5) Food intolerances including gluten and dairy avoidance.   PLAN -Labs from this morning reviewed stable.  -Leukocytosis related to steroids and previous G-CSF use.  No fevers or signs of infection. -No new toxicities from his chemotherapy.  No significant nausea. -Patient appropriate to proceed with cycle 4-day 3 of EPOCH -DVT prophylaxis with ambulation as  much as possible.  Patient prefers to hold off on Lovenox shots. -Continue home medications and Protonix 40 mg daily. - Senokot-S dose to be scheduled 1 tablet daily. -Rituxan and Udenyca are scheduled already at the cancer center on day 8 on 10/09/2021    SUBJECTIVE:   Allen Hodge is doing well this morning has no acute new concerns overnight.  Minimal acneform rash over the scalp between treatments likely due to steroids.  No fevers no chills no night sweats. No uncontrolled nausea or vomiting. Some muscle stiffness but no significant tingling or numbness in his hands or feet. Good p.o. intake. No other acute new issues overnight.  Oncology History  Diffuse large B-cell lymphoma of lymph nodes of multiple regions ([name])  07/21/2021 Initial Diagnosis   Diffuse large B-cell lymphoma of lymph nodes of multiple regions ([place])   07/27/2021 - 08/19/2021 Chemotherapy   Patient is on Treatment Plan : NON-HODGKINS LYMPHOMA R-CHOP q21d     09/04/2021 Cancer Staging   Staging form: Hodgkin and Non-Hodgkin Lymphoma, AJCC 8th Edition - Clinical: Stage IV (Diffuse large B-cell lymphoma) - Signed by Brunetta Genera, MD on 09/04/2021 Stage prefix: Initial diagnosis   09/11/2021 -  Chemotherapy   Patient is on Treatment Plan : IP NON-HODGKINS LYMPHOMA EPOCH q21d     09/18/2021 -  Chemotherapy   Patient is on Treatment Plan : NON-HODGKINS LYMPHOMA Rituximab q21d        REVIEW OF SYSTEMS:  10 Point review of Systems was done is negative except as noted above.  I have reviewed the past medical history, past surgical history, social history and family history with the patient and they are unchanged from previous note.   PHYSICAL EXAMINATION: ECOG PERFORMANCE STATUS: 1 - Symptomatic but completely ambulatory  Vitals:   10/03/21 2020 10/04/21 0539  BP: 110/68 115/73  Pulse: 80 65  Resp: 18 16  Temp: 98 F (36.7 C) 97.8 F (36.6 C)  SpO2: 96% 96%   Filed Weights   10/02/21 1127  Weight: 181 lb  6.4 oz (82.3 kg)    Intake/Output from previous day: 02/21 0701 - 02/22 0700 In: 1184.3 [P.O.:695; I.V.:1.2; IV Piggyback:488.1] Out: -  NAD GENERAL:alert, in no acute distress and comfortable SKIN: no acute rashes, no significant lesions EYES: conjunctiva are pink and non-injected, sclera anicteric OROPHARYNX: MMM, no exudates, no oropharyngeal erythema or ulceration NECK: supple, no JVD LYMPH:  no palpable lymphadenopathy in the cervical, axillary or inguinal regions LUNGS: clear to auscultation b/l with normal respiratory effort HEART: regular rate & rhythm ABDOMEN:  normoactive bowel sounds , non tender, not distended. Extremity: no pedal edema PSYCH: alert & oriented x 3 with fluent speech NEURO: no focal motor/sensory deficits   LABORATORY DATA:  I have reviewed the data as listed CMP Latest Ref Rng & Units 10/04/2021 10/03/2021 10/02/2021  Glucose 70 - 99 mg/dL 123(H) 123(H) 106(H)  BUN 6 - 20 mg/dL 20 18 13   Creatinine 0.61 - 1.24 mg/dL 0.74 0.78 0.72  Sodium 135 - 145 mmol/L 136 136 135  Potassium 3.5 - 5.1 mmol/L 3.7 3.9 4.0  Chloride 98 - 111 mmol/L 107 106 103  CO2 22 - 32 mmol/L 24 23 26   Calcium 8.9 - 10.3 mg/dL 8.9 9.4 9.2  Total Protein 6.5 - 8.1 g/dL 6.1(L) 6.6 6.8  Total Bilirubin 0.3 - 1.2 mg/dL 0.2(L) 0.4 0.2(L)  Alkaline Phos 38 - 126 U/L 45 55 61  AST 15 - 41 U/L 14(L) 17 15  ALT 0 - 44 U/L 15 18 16    . CBC Latest Ref Rng & Units 10/04/2021 10/03/2021 10/02/2021  WBC 4.0 - 10.5 K/uL 18.2(H) 24.4(H) 13.1(H)  Hemoglobin 13.0 - 17.0 g/dL 11.5(L) 12.3(L) 12.6(L)  Hematocrit 39.0 - 52.0 % 34.9(L) 36.7(L) 38.1(L)  Platelets 150 - 400 K/uL 200 206 206     Lab Results  Component Value Date   WBC 18.2 (H) 10/04/2021   HGB 11.5 (L) 10/04/2021   HCT 34.9 (L) 10/04/2021   MCV 91.4 10/04/2021   PLT 200 10/04/2021   NEUTROABS 15.8 (H) 10/04/2021    No results found for: CEA1, CEA, TIW580, CA125, PSA1  NM PET Image Restag (PS) Skull Base To Thigh  Result  Date: 09/22/2021 CLINICAL DATA:  Subsequent treatment strategy for lymphoma. EXAM: NUCLEAR MEDICINE PET SKULL BASE TO THIGH TECHNIQUE: 9.1 mCi F-18 FDG was injected intravenously. Full-ring PET imaging was performed from the skull base to thigh after the radiotracer. CT data was obtained and used for attenuation correction and anatomic localization. Fasting blood glucose: 85 mg/dl COMPARISON:  07/04/2021 FINDINGS: Mediastinal blood pool activity: SUV max 2.2 Liver activity: SUV max 3.2 NECK: Mild asymmetric activity along the vicinity of the right palatine tonsil with maximum SUV 3 point 4, contralateral side with maximum SUV 4.2. Previously the right palatine tonsil region had a maximum SUV of 5.6. No discernible mass on the CT data. This may well be physiologic. A previous 0.6 cm right level V lymph node with a maximum SUV of 4.1 shown on the prior exam has completely resolved (Deauville 1). Incidental CT findings: Mild chronic bilateral maxillary sinusitis. CHEST: Previously hypermetabolic paratracheal, subcarinal, and left paraesophageal adenopathy has completely resolved (Deauville 1). 0.5 cm right supraclavicular lymph node with maximum SUV 1.7 on image 55 series 4 (Deauville 2), previously 1.2 cm with maximum SUV 6.3. Accentuated distal esophageal activity without  CT abnormality, maximum SUV 4.4, probably physiologic. Incidental CT findings: Left Port-A-Cath tip: Right atrium. Aberrant right subclavian artery passes behind the esophagus. Mild linear scarring in the right lower lobe. Previous sub solid nodularity in the right upper lobe has resolved. ABDOMEN/PELVIS: The previous bulky adenopathy in the mesentery and retroperitoneum has mostly resolved, with stranding in the mesentery as is often encountered in the setting of successful treatment in lymphoma. Index lymph node anterior to the left adrenal gland measuring 0.6 cm in short axis on image 124 series 4 has a maximum SUV of 2.1 (Deauville 2),  previously measuring 1.7 cm with maximum SUV 7.3. No new adenopathy in the abdomen/pelvis. The previously thickened and hypermetabolic loop of small bowel in the central pelvis shown on the prior exam currently appears normal (Deauville 1). Incidental CT findings: Prominent stool throughout the colon favors constipation. SKELETON: Diffuse accentuated metabolic activity in the skeleton. Incidental CT findings: none IMPRESSION: 1. Marked interval reduction in size and activity of prior adenopathy in the and abdomen. Much of the prior adenopathy has completely resolved (Deauville 1), with some small remaining lymph nodes demonstrating Deauville 2 levels of activity. The previous abnormal hypermetabolic small bowel wall thickening in the pelvis has completely resolved. 2. Diffuse accentuated metabolic activity in the skeleton, probably attributable to granulocyte stimulation. 3. 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 Prominent stool throughout the colon favors constipation. Electronically Signed   By: Van Clines M.D.   On: 09/22/2021 09:35     Future Appointments  Date Time Provider [place]  10/09/2021  8:00 AM [facility] [place] FLUSH [facility] None  10/09/2021  9:00 AM [facility] INFUSION [facility] None  10/16/2021 10:00 AM [facility] [place] None  10/16/2021 10:40 AM Brunetta Genera, MD [facility] None  10/30/2021  9:15 AM [facility] [place] FLUSH [facility] None  10/30/2021 10:15 AM [facility] INFUSION [facility] None      LOS: 2 days   Sullivan Lone MD MS

## 2021-10-04 NOTE — Discharge Planning (Signed)
Oncology Discharge Planning Admission Note  [facility] at [facility] Address: [address], [place], [place] [address] Hours of Operation:  8am - 5pm, Monday - Friday  Clinic Contact Information:  [phone]) [phone]  Oncology Care Team: Medical Oncologist:  Dr. Irene Limbo  Dr. Irene Limbo is aware of this hospital admission dated 10/02/21 and has assessed patient at bedside. The cancer center will follow Cloyd Stagers inpatient care to assist with discharge planning as indicated by the oncologist.  We will arrange for outpatient follow up closer to discharge.  Disclaimer:  This [place] note does not imply a formal consult request has been made by the admitting attending for this admission or there will be an inpatient consult completed by oncology.  Please request oncology consults as per standard process as indicated.

## 2021-10-05 ENCOUNTER — Encounter: Payer: Self-pay | Admitting: Hematology

## 2021-10-05 DIAGNOSIS — Z5111 Encounter for antineoplastic chemotherapy: Principal | ICD-10-CM

## 2021-10-05 LAB — CBC WITH DIFFERENTIAL/PLATELET
Abs Immature Granulocytes: 0.13 10*3/uL — ABNORMAL HIGH (ref 0.00–0.07)
Basophils Absolute: 0 10*3/uL (ref 0.0–0.1)
Basophils Relative: 0 %
Eosinophils Absolute: 0 10*3/uL (ref 0.0–0.5)
Eosinophils Relative: 0 %
HCT: 33.2 % — ABNORMAL LOW (ref 39.0–52.0)
Hemoglobin: 11.3 g/dL — ABNORMAL LOW (ref 13.0–17.0)
Immature Granulocytes: 1 %
Lymphocytes Relative: 10 %
Lymphs Abs: 1.5 10*3/uL (ref 0.7–4.0)
MCH: 30.8 pg (ref 26.0–34.0)
MCHC: 34 g/dL (ref 30.0–36.0)
MCV: 90.5 fL (ref 80.0–100.0)
Monocytes Absolute: 1 10*3/uL (ref 0.1–1.0)
Monocytes Relative: 7 %
Neutro Abs: 12.7 10*3/uL — ABNORMAL HIGH (ref 1.7–7.7)
Neutrophils Relative %: 82 %
Platelets: 213 10*3/uL (ref 150–400)
RBC: 3.67 MIL/uL — ABNORMAL LOW (ref 4.22–5.81)
RDW: 17.7 % — ABNORMAL HIGH (ref 11.5–15.5)
WBC: 15.3 10*3/uL — ABNORMAL HIGH (ref 4.0–10.5)
nRBC: 0 % (ref 0.0–0.2)

## 2021-10-05 LAB — COMPREHENSIVE METABOLIC PANEL
ALT: 17 U/L (ref 0–44)
AST: 14 U/L — ABNORMAL LOW (ref 15–41)
Albumin: 3.5 g/dL (ref 3.5–5.0)
Alkaline Phosphatase: 43 U/L (ref 38–126)
Anion gap: 6 (ref 5–15)
BUN: 18 mg/dL (ref 6–20)
CO2: 26 mmol/L (ref 22–32)
Calcium: 8.9 mg/dL (ref 8.9–10.3)
Chloride: 105 mmol/L (ref 98–111)
Creatinine, Ser: 0.72 mg/dL (ref 0.61–1.24)
GFR, Estimated: >60 mL/min (ref >60)
Glucose, Bld: 110 mg/dL — ABNORMAL HIGH (ref 70–99)
Potassium: 3.5 mmol/L (ref 3.5–5.1)
Sodium: 137 mmol/L (ref 135–145)
Total Bilirubin: 0.4 mg/dL (ref 0.3–1.2)
Total Protein: 5.8 g/dL — ABNORMAL LOW (ref 6.5–8.1)

## 2021-10-05 MED ORDER — VINCRISTINE SULFATE CHEMO INJECTION 1 MG/ML
Freq: Once | INTRAVENOUS | Status: AC
  Filled 2021-10-05: qty 10

## 2021-10-05 MED ORDER — LORAZEPAM 0.5 MG PO TABS: 0.5000 mg | ORAL_TABLET | Freq: Four times a day (QID) | ORAL | 0 refills | Status: DC | PRN

## 2021-10-05 MED ORDER — SODIUM CHLORIDE 0.9 % IV SOLN
750.0000 mg/m2 | Freq: Once | INTRAVENOUS | Status: AC
  Administered 2021-10-06: 1520 mg via INTRAVENOUS
  Filled 2021-10-05: qty 76

## 2021-10-05 MED ORDER — SODIUM CHLORIDE 0.9 % IV SOLN
Freq: Once | INTRAVENOUS | Status: AC
  Administered 2021-10-06: 36 mg via INTRAVENOUS
  Filled 2021-10-05: qty 8

## 2021-10-05 MED ORDER — SODIUM CHLORIDE 0.9 % IV SOLN
Freq: Once | INTRAVENOUS | Status: AC
  Administered 2021-10-05: 68 mg via INTRAVENOUS
  Filled 2021-10-05: qty 4

## 2021-10-05 MED ORDER — POTASSIUM CHLORIDE CRYS ER 20 MEQ PO TBCR
20.0000 meq | EXTENDED_RELEASE_TABLET | Freq: Once | ORAL | Status: AC
  Administered 2021-10-05: 20 meq via ORAL
  Filled 2021-10-05: qty 1

## 2021-10-05 NOTE — Progress Notes (Addendum)
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 Lymphoma FISH panel shows no evidence of c-Myc rearrangement ruling out double hit or triple hit lymphoma. Patient presented with extensive abdominal and retroperitoneal lymphadenopathy, right supraclavicular lymphadenopathy and lung nodules concerning for lymphoma involvement as well as concern for small bowel involvement. -PET scan 09/21/2021- "1. Marked interval reduction in size and activity of prior adenopathy in the and abdomen. Much of the prior adenopathy has completely resolved (Deauville 1), with some small remaining lymph nodes demonstrating Deauville 2 levels of activity. The previous abnormal hypermetabolic small bowel wall thickening in the pelvis has completely resolved. 2. Diffuse accentuated metabolic activity in the skeleton, probably attributable to granulocyte stimulation. 3. 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 Prominent stool throughout the colon favors constipation." 2) pulmonary nodules concerning for lymphoma involvement 3) 4.4 cm small bowel thickening concerning for involvement by lymphoma.  Not visible on repeat PET scan after 3 cycles of treatment suggesting this is most likely lymphoma 4) acute kidney injury.  Patient's creatinine had bumped to 1.6 but on repeat is back down to normal. 5) Food intolerances including gluten and dairy avoidance.   PLAN -Labs from this morning reviewed stable.  -Leukocytosis related to steroids and previous G-CSF use.  No fevers or signs of infection. -No new toxicities from his chemotherapy.  No significant nausea. -Patient appropriate to proceed with cycle 4-day 4 of EPOCH -DVT prophylaxis with ambulation as  much as possible.  Patient prefers to hold off on Lovenox shots. -Continue home medications and Protonix 40 mg daily. - Senokot-S dose to be scheduled 1 tablet daily. -Potassium level at the low end of normal.  We will give potassium chloride 20 mEq x 1 today. -Rituxan and Ellen Henri are scheduled already at the cancer center on day 8 on 10/09/2021  SUBJECTIVE:   Allen Hodge is doing well this morning has no acute new concerns overnight.  Minimal acneform rash over the scalp between treatments likely due to steroids.  No fevers no chills no night sweats. No uncontrolled nausea or vomiting. Some muscle stiffness but no significant tingling or numbness in his hands or feet. Good p.o. intake. No other acute new issues overnight.  Oncology History  Diffuse large B-cell lymphoma of lymph nodes of multiple regions ([place])  07/21/2021 Initial Diagnosis   Diffuse large B-cell lymphoma of lymph nodes of multiple regions ([place])   07/27/2021 - 08/19/2021 Chemotherapy   Patient is on Treatment Plan : NON-HODGKINS LYMPHOMA R-CHOP q21d     09/04/2021 Cancer Staging   Staging form: Hodgkin and Non-Hodgkin Lymphoma, AJCC 8th Edition - Clinical: Stage IV (Diffuse large B-cell lymphoma) - Signed by Brunetta Genera, MD on 09/04/2021 Stage prefix: Initial diagnosis    09/11/2021 -  Chemotherapy   Patient is on Treatment Plan : IP NON-HODGKINS LYMPHOMA EPOCH q21d     09/18/2021 -  Chemotherapy   Patient is on Treatment Plan : NON-HODGKINS LYMPHOMA Rituximab q21d        REVIEW OF SYSTEMS:  10 Point review of Systems was done is negative except as noted above.  I have reviewed the past medical history, past surgical history, social history and family history with the patient and they are unchanged from previous note.   PHYSICAL EXAMINATION: ECOG PERFORMANCE STATUS: 1 - Symptomatic but completely  ambulatory  Vitals:   10/04/21 2116 10/05/21 0507  BP: 101/69 116/74  Pulse: 81 66  Resp: 17 14  Temp: 98.4 F  (36.9 C) 97.9 F (36.6 C)  SpO2: 96% 98%   Filed Weights   10/02/21 1127  Weight: 82.3 kg    Intake/Output from previous day: 02/22 0701 - 02/23 0700 In: 2037.9 [P.O.:480; I.V.:487; IV Piggyback:1070.9] Out: -  NAD GENERAL:alert, in no acute distress and comfortable SKIN: no acute rashes, no significant lesions EYES: conjunctiva are pink and non-injected, sclera anicteric OROPHARYNX: MMM, no exudates, no oropharyngeal erythema or ulceration NECK: supple, no JVD LYMPH:  no palpable lymphadenopathy in the cervical, axillary or inguinal regions LUNGS: clear to auscultation b/l with normal respiratory effort HEART: regular rate & rhythm ABDOMEN:  normoactive bowel sounds , non tender, not distended. Extremity: no pedal edema PSYCH: alert & oriented x 3 with fluent speech NEURO: no focal motor/sensory deficits   LABORATORY DATA:  I have reviewed the data as listed CMP Latest Ref Rng & Units 10/05/2021 10/04/2021 10/03/2021  Glucose 70 - 99 mg/dL 110(H) 123(H) 123(H)  BUN 6 - 20 mg/dL 18 20 18   Creatinine 0.61 - 1.24 mg/dL 0.72 0.74 0.78  Sodium 135 - 145 mmol/L 137 136 136  Potassium 3.5 - 5.1 mmol/L 3.5 3.7 3.9  Chloride 98 - 111 mmol/L 105 107 106  CO2 22 - 32 mmol/L 26 24 23   Calcium 8.9 - 10.3 mg/dL 8.9 8.9 9.4  Total Protein 6.5 - 8.1 g/dL 5.8(L) 6.1(L) 6.6  Total Bilirubin 0.3 - 1.2 mg/dL 0.4 0.2(L) 0.4  Alkaline Phos 38 - 126 U/L 43 45 55  AST 15 - 41 U/L 14(L) 14(L) 17  ALT 0 - 44 U/L 17 15 18    . CBC Latest Ref Rng & Units 10/05/2021 10/04/2021 10/03/2021  WBC 4.0 - 10.5 K/uL 15.3(H) 18.2(H) 24.4(H)  Hemoglobin 13.0 - 17.0 g/dL 11.3(L) 11.5(L) 12.3(L)  Hematocrit 39.0 - 52.0 % 33.2(L) 34.9(L) 36.7(L)  Platelets 150 - 400 K/uL 213 200 206     Lab Results  Component Value Date   WBC 15.3 (H) 10/05/2021   HGB 11.3 (L) 10/05/2021   HCT 33.2 (L) 10/05/2021   MCV 90.5 10/05/2021   PLT 213 10/05/2021   NEUTROABS 12.7 (H) 10/05/2021    No results found for:  CEA1, CEA, IWP809, CA125, PSA1  NM PET Image Restag (PS) Skull Base To Thigh  Result Date: 09/22/2021 CLINICAL DATA:  Subsequent treatment strategy for lymphoma. EXAM: NUCLEAR MEDICINE PET SKULL BASE TO THIGH TECHNIQUE: 9.1 mCi F-18 FDG was injected intravenously. Full-ring PET imaging was performed from the skull base to thigh after the radiotracer. CT data was obtained and used for attenuation correction and anatomic localization. Fasting blood glucose: 85 mg/dl COMPARISON:  07/04/2021 FINDINGS: Mediastinal blood pool activity: SUV max 2.2 Liver activity: SUV max 3.2 NECK: Mild asymmetric activity along the vicinity of the right palatine tonsil with maximum SUV 3 point 4, contralateral side with maximum SUV 4.2. Previously the right palatine tonsil region had a maximum SUV of 5.6. No discernible mass on the CT data. This may well be physiologic. A previous 0.6 cm right level V lymph node with a maximum SUV of 4.1 shown on the prior exam has completely resolved (Deauville 1). Incidental CT findings: Mild chronic bilateral maxillary sinusitis. CHEST: Previously hypermetabolic paratracheal, subcarinal, and left paraesophageal adenopathy has completely resolved (Deauville 1). 0.5 cm right supraclavicular lymph node with maximum SUV 1.7 on image 55 series 4 (  Deauville 2), previously 1.2 cm with maximum SUV 6.3. Accentuated distal esophageal activity without CT abnormality, maximum SUV 4.4, probably physiologic. Incidental CT findings: Left Port-A-Cath tip: Right atrium. Aberrant right subclavian artery passes behind the esophagus. Mild linear scarring in the right lower lobe. Previous sub solid nodularity in the right upper lobe has resolved. ABDOMEN/PELVIS: The previous bulky adenopathy in the mesentery and retroperitoneum has mostly resolved, with stranding in the mesentery as is often encountered in the setting of successful treatment in lymphoma. Index lymph node anterior to the left adrenal gland measuring 0.6  cm in short axis on image 124 series 4 has a maximum SUV of 2.1 (Deauville 2), previously measuring 1.7 cm with maximum SUV 7.3. No new adenopathy in the abdomen/pelvis. The previously thickened and hypermetabolic loop of small bowel in the central pelvis shown on the prior exam currently appears normal (Deauville 1). Incidental CT findings: Prominent stool throughout the colon favors constipation. SKELETON: Diffuse accentuated metabolic activity in the skeleton. Incidental CT findings: none IMPRESSION: 1. Marked interval reduction in size and activity of prior adenopathy in the and abdomen. Much of the prior adenopathy has completely resolved (Deauville 1), with some small remaining lymph nodes demonstrating Deauville 2 levels of activity. The previous abnormal hypermetabolic small bowel wall thickening in the pelvis has completely resolved. 2. Diffuse accentuated metabolic activity in the skeleton, probably attributable to granulocyte stimulation. 3. 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 Prominent stool throughout the colon favors constipation. Electronically Signed   By: Van Clines M.D.   On: 09/22/2021 09:35     Future Appointments  Date Time Provider [place]  10/09/2021  8:00 AM [facility] [place] FLUSH [facility] None  10/09/2021  9:00 AM [facility] INFUSION [facility] None  10/16/2021 10:00 AM [facility] [place] None  10/16/2021 10:40 AM Brunetta Genera, MD [facility] None  10/30/2021  9:15 AM [facility] [place] FLUSH [facility] None  10/30/2021 10:15 AM [facility] INFUSION [facility] None      LOS: 3 days    ADDENDUM  .Patient was Personally and independently interviewed, examined and relevant elements of the history of present illness were reviewed in details and an assessment and plan was created. All elements of the patient's history of present illness , assessment and  plan were discussed in details with Allen Bussing NP. The above documentation reflects our combined findings assessment and plan.   Sullivan Lone MD MS

## 2021-10-06 ENCOUNTER — Encounter: Payer: Self-pay | Admitting: Hematology

## 2021-10-06 LAB — COMPREHENSIVE METABOLIC PANEL
ALT: 17 U/L (ref 0–44)
AST: 15 U/L (ref 15–41)
Albumin: 3.5 g/dL (ref 3.5–5.0)
Alkaline Phosphatase: 41 U/L (ref 38–126)
Anion gap: 6 (ref 5–15)
BUN: 22 mg/dL — ABNORMAL HIGH (ref 6–20)
CO2: 26 mmol/L (ref 22–32)
Calcium: 8.9 mg/dL (ref 8.9–10.3)
Chloride: 106 mmol/L (ref 98–111)
Creatinine, Ser: 0.83 mg/dL (ref 0.61–1.24)
GFR, Estimated: >60 mL/min (ref >60)
Glucose, Bld: 106 mg/dL — ABNORMAL HIGH (ref 70–99)
Potassium: 3.7 mmol/L (ref 3.5–5.1)
Sodium: 138 mmol/L (ref 135–145)
Total Bilirubin: 0.5 mg/dL (ref 0.3–1.2)
Total Protein: 5.6 g/dL — ABNORMAL LOW (ref 6.5–8.1)

## 2021-10-06 LAB — CBC WITH DIFFERENTIAL/PLATELET
Abs Immature Granulocytes: 0.1 10*3/uL — ABNORMAL HIGH (ref 0.00–0.07)
Basophils Absolute: 0 10*3/uL (ref 0.0–0.1)
Basophils Relative: 0 %
Eosinophils Absolute: 0 10*3/uL (ref 0.0–0.5)
Eosinophils Relative: 0 %
HCT: 32.4 % — ABNORMAL LOW (ref 39.0–52.0)
Hemoglobin: 11 g/dL — ABNORMAL LOW (ref 13.0–17.0)
Immature Granulocytes: 1 %
Lymphocytes Relative: 9 %
Lymphs Abs: 1 10*3/uL (ref 0.7–4.0)
MCH: 30.1 pg (ref 26.0–34.0)
MCHC: 34 g/dL (ref 30.0–36.0)
MCV: 88.8 fL (ref 80.0–100.0)
Monocytes Absolute: 0.8 10*3/uL (ref 0.1–1.0)
Monocytes Relative: 7 %
Neutro Abs: 8.7 10*3/uL — ABNORMAL HIGH (ref 1.7–7.7)
Neutrophils Relative %: 83 %
Platelets: 215 10*3/uL (ref 150–400)
RBC: 3.65 MIL/uL — ABNORMAL LOW (ref 4.22–5.81)
RDW: 17.2 % — ABNORMAL HIGH (ref 11.5–15.5)
WBC: 10.6 10*3/uL — ABNORMAL HIGH (ref 4.0–10.5)
nRBC: 0 % (ref 0.0–0.2)

## 2021-10-06 MED ORDER — SODIUM BICARBONATE/SODIUM CHLORIDE MOUTHWASH: 1.0000 "application " | OROMUCOSAL | Status: DC | PRN

## 2021-10-06 MED ORDER — HEPARIN SOD (PORK) LOCK FLUSH 100 UNIT/ML IV SOLN
500.0000 [IU] | Freq: Once | INTRAVENOUS | Status: AC
  Administered 2021-10-06: 500 [IU] via INTRAVENOUS
  Filled 2021-10-06: qty 5

## 2021-10-06 NOTE — Discharge Summary (Addendum)
Discharge Summary  Patient ID: Allen Hodge MRN: [id number] DOB/AGE: 41/18/1982 41 y.o.  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 [facility]) Active Problems:   Diffuse large B-cell lymphoma of lymph nodes of multiple regions [facility])   Encounter for antineoplastic chemotherapy   Discharged Condition: good  Discharge Labs:  CBC    Component Value Date/Time   WBC 10.6 (H) 10/06/2021 0500   RBC 3.65 (L) 10/06/2021 0500   HGB 11.0 (L) 10/06/2021 0500   HGB 10.9 (L) 09/25/2021 0905   HCT 32.4 (L) 10/06/2021 0500   PLT 215 10/06/2021 0500   PLT 191 09/25/2021 0905   MCV 88.8 10/06/2021 0500   MCH 30.1 10/06/2021 0500   MCHC 34.0 10/06/2021 0500   RDW 17.2 (H) 10/06/2021 0500   LYMPHSABS 1.0 10/06/2021 0500   MONOABS 0.8 10/06/2021 0500   EOSABS 0.0 10/06/2021 0500   BASOSABS 0.0 10/06/2021 0500   CMP Latest Ref Rng & Units 10/06/2021 10/05/2021 10/04/2021  Glucose 70 - 99 mg/dL 106(H) 110(H) 123(H)  BUN 6 - 20 mg/dL 22(H) 18 20  Creatinine 0.61 - 1.24 mg/dL 0.83 0.72 0.74  Sodium 135 - 145 mmol/L 138 137 136  Potassium 3.5 - 5.1 mmol/L 3.7 3.5 3.7  Chloride 98 - 111 mmol/L 106 105 107  CO2 22 - 32 mmol/L 26 26 24   Calcium 8.9 - 10.3 mg/dL 8.9 8.9 8.9  Total Protein 6.5 - 8.1 g/dL 5.6(L) 5.8(L) 6.1(L)  Total Bilirubin 0.3 - 1.2 mg/dL 0.5 0.4 0.2(L)  Alkaline Phos 38 - 126 U/L 41 43 45  AST 15 - 41 U/L 15 14(L) 14(L)  ALT 0 - 44 U/L 17 17 15     Consults: None  Procedures: None  Disposition:  Discharge disposition: 01-Home or Self Care      Allergies as of 10/06/2021       Reactions   Bee Pollen-1000-royal Jelly [nutritional Supplements] Anaphylaxis   And Bee's Wax - inflammatory reaction per pt    Gluten Meal Other (See Comments)   Abdominal cramping    Lactose Intolerance (gi) Other (See Comments)   Abdominal cramping        Medication List      STOP taking these medications    allopurinol 100 MG tablet Commonly known as: ZYLOPRIM   COLACE PO       TAKE these medications    acetaminophen 325 MG tablet Commonly known as: TYLENOL Take 2 tablets (650 mg total) by mouth every 4 (four) hours as needed for mild pain. What changed: Another medication with the same name was removed. Continue taking this medication, and follow the directions you see here.   b complex vitamins capsule Take 1 capsule by mouth 2 (two) times a week.   HYDROcodone-acetaminophen 5-325 MG tablet Commonly known as: Norco Take 1-2 tablets by mouth every 6 (six) hours as needed for moderate pain.   ibuprofen 200 MG tablet Commonly known as: ADVIL Take 400 mg by mouth every 6 (six) hours as needed (pain).   lidocaine-prilocaine cream Commonly known as: EMLA Apply to affected area once What changed:  how much to take how to take this when to take this reasons to take this additional instructions   loratadine 10 MG tablet Commonly known as: CLARITIN Take 10 mg by mouth daily as needed (seasonal allergies).   LORazepam 0.5 MG tablet Commonly known as: Ativan Take 1 tablet (0.5  mg total) by mouth every 6 (six) hours as needed (Nausea or vomiting).   magic mouthwash w/lidocaine Soln Take 5 mLs by mouth 4 (four) times daily as needed for mouth pain. Rx: 50 mL viscous lidocaine 2% 50 mL Maalox 50 mL diphenhydramine (Benadryl) at 12.5 mg per 5 ml elixir 50 mL nystatin (100,000U per 5 mL) suspension 50 mL distilled water   ondansetron 8 MG tablet Commonly known as: Zofran Take 1 tablet (8 mg total) by mouth 2 (two) times daily as needed for refractory nausea / vomiting. Start on day 3 after cyclophosphamide chemotherapy.   pantoprazole 40 MG tablet Commonly known as: PROTONIX Take 1 tablet (40 mg total) by mouth daily before breakfast. What changed:  when to take this reasons to take this   polyethylene glycol 17 g packet Commonly  known as: MIRALAX / GLYCOLAX Take 17 g by mouth daily as needed (constipation).   prochlorperazine 10 MG tablet Commonly known as: COMPAZINE Take 1 tablet (10 mg total) by mouth every 6 (six) hours as needed (Nausea or vomiting). What changed: reasons to take this   senna-docusate 8.6-50 MG tablet Commonly known as: Senokot-S Take 1 tablet by mouth at bedtime as needed for mild constipation.   sodium bicarbonate/sodium chloride Soln 1 application by Mouth Rinse route as needed for dry mouth.   VITAMIN C PO Take 1 tablet by mouth 2 (two) times daily.   VITAMIN D-3 PO Take 1 tablet by mouth 2 (two) times daily.       HPI: Allen Hodge is a 41 year old male with diffuse large B-cell lymphoma. He initially received 2 cycles of R-CHOP and has tolerated well.  Although patient does not have double hit lymphoma, recommendation was made to switch to [facility] beginning with cycle #3.  He received his first cycle of EPOCH-R beginning on 09/11/2021.  He tolerated this very well.  He had a PET scan performed on 09/21/2021 which showed a significant response to treatment.  On the day of admission, he was feeling well.  He was not having any fevers, chills, mucositis, chest pain, shortness of breath, abdominal pain, nausea, vomiting, constipation, diarrhea.  He was admitted to the hospital on 10/02/2021 for cycle #4 of chemotherapy.  Hospital Course: Chemotherapy started as planned on the day of admission.  He overall tolerated his chemotherapy well with no reported side effects.  He developed borderline hypokalemia on 10/05/2021 with a potassium level of 3.5.  He was given a one-time dose of potassium chloride 20 mEq with improvement of his potassium level on the day of discharge.  The remainder of his labs on the day of discharge were also reviewed and stable. The patient is stable for discharge after completion of chemotherapy on 10/06/2021.  He will discharge to home.  Outpatient follow-up appointments  have been scheduled as noted below.  Discharge Instructions     Diet general   Complete by: As directed    Increase activity slowly   Complete by: As directed       Future Appointments  Date Time Provider [place]  10/09/2021  8:00 AM [facility] [place] None  10/09/2021  9:00 AM [facility] INFUSION [facility] None  10/16/2021 10:00 AM [facility] [place] None  10/16/2021 10:40 AM Brunetta Genera, MD [facility] None  10/30/2021  9:15 AM [facility] [place] FLUSH [facility] None  10/30/2021 10:15 AM [facility] INFUSION [facility] None   Signed: Mikey Bussing 10/06/2021, 8:51 AM    ADDENDUM  All aspects of  the discharge planning, orders and discussed with Mikey Bussing DNP.  Documentation reflects our combined assessment and discharge plan. Appointments noted.     Sullivan Lone MD MS  TT>30 mins

## 2021-10-06 NOTE — Discharge Planning (Signed)
Oncology Discharge Planning Note  [facility] Address: [address], [place], [place] [address] Hours of Operation:  Nena Polio, Monday - Friday  Clinic Contact Information:  [phone]) [phone]  Oncology Care Team: Medical Oncologist:  Dr. 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   Upon discharge from the hospital, hematology/oncology's post discharge plan of care for the outpatient setting is: 10/09/21 with port flush/Lab and treatment   Truett Mcfarlan will be called within two business days after discharge to review hematology/oncology's plan of care for full understanding.    Outpatient Oncology Specific Care Only: Oncology appointment transportation needs addressed?:  not applicable Oncology medication management for symptom management addressed?:  not applicable Chemo Alert Card reviewed?:  not applicable Immunotherapy Alert Card reviewed?:  not applicable

## 2021-10-09 ENCOUNTER — Inpatient Hospital Stay: Payer: No Typology Code available for payment source

## 2021-10-09 ENCOUNTER — Encounter (HOSPITAL_COMMUNITY): Payer: Self-pay | Admitting: *Deleted

## 2021-10-09 ENCOUNTER — Inpatient Hospital Stay (HOSPITAL_BASED_OUTPATIENT_CLINIC_OR_DEPARTMENT_OTHER): Payer: No Typology Code available for payment source

## 2021-10-09 ENCOUNTER — Other Ambulatory Visit: Payer: Self-pay

## 2021-10-09 ENCOUNTER — Other Ambulatory Visit: Payer: Self-pay | Admitting: Hematology

## 2021-10-09 VITALS — BP 113/74 | HR 82 | Temp 97.7°F | Resp 16 | Wt 184.2 lb

## 2021-10-09 DIAGNOSIS — Z7189 Other specified counseling: Secondary | ICD-10-CM

## 2021-10-09 DIAGNOSIS — Z95828 Presence of other vascular implants and grafts: Secondary | ICD-10-CM

## 2021-10-09 DIAGNOSIS — C8338 Diffuse large B-cell lymphoma, lymph nodes of multiple sites: Secondary | ICD-10-CM

## 2021-10-09 DIAGNOSIS — C8590 Non-Hodgkin lymphoma, unspecified, unspecified site: Secondary | ICD-10-CM | POA: Diagnosis present

## 2021-10-09 DIAGNOSIS — Z5189 Encounter for other specified aftercare: Secondary | ICD-10-CM | POA: Diagnosis not present

## 2021-10-09 DIAGNOSIS — Z5112 Encounter for antineoplastic immunotherapy: Secondary | ICD-10-CM | POA: Diagnosis not present

## 2021-10-09 LAB — CMP (CANCER CENTER ONLY)
ALT: 12 U/L (ref 0–44)
AST: 7 U/L — ABNORMAL LOW (ref 15–41)
Albumin: 4 g/dL (ref 3.5–5.0)
Alkaline Phosphatase: 39 U/L (ref 38–126)
Anion gap: 5 (ref 5–15)
BUN: 19 mg/dL (ref 6–20)
CO2: 31 mmol/L (ref 22–32)
Calcium: 9.5 mg/dL (ref 8.9–10.3)
Chloride: 100 mmol/L (ref 98–111)
Creatinine: 0.66 mg/dL (ref 0.61–1.24)
GFR, Estimated: >60 mL/min (ref >60)
Glucose, Bld: 86 mg/dL (ref 70–99)
Potassium: 4.1 mmol/L (ref 3.5–5.1)
Sodium: 136 mmol/L (ref 135–145)
Total Bilirubin: 0.5 mg/dL (ref 0.3–1.2)
Total Protein: 6.2 g/dL — ABNORMAL LOW (ref 6.5–8.1)

## 2021-10-09 LAB — CBC WITH DIFFERENTIAL (CANCER CENTER ONLY)
Abs Immature Granulocytes: 0.05 10*3/uL (ref 0.00–0.07)
Basophils Absolute: 0 10*3/uL (ref 0.0–0.1)
Basophils Relative: 0 %
Eosinophils Absolute: 0.1 10*3/uL (ref 0.0–0.5)
Eosinophils Relative: 2 %
HCT: 35.6 % — ABNORMAL LOW (ref 39.0–52.0)
Hemoglobin: 12.1 g/dL — ABNORMAL LOW (ref 13.0–17.0)
Immature Granulocytes: 1 %
Lymphocytes Relative: 27 %
Lymphs Abs: 1.7 10*3/uL (ref 0.7–4.0)
MCH: 29.9 pg (ref 26.0–34.0)
MCHC: 34 g/dL (ref 30.0–36.0)
MCV: 87.9 fL (ref 80.0–100.0)
Monocytes Absolute: 0.1 10*3/uL (ref 0.1–1.0)
Monocytes Relative: 1 %
Neutro Abs: 4.4 10*3/uL (ref 1.7–7.7)
Neutrophils Relative %: 69 %
Platelet Count: 284 10*3/uL (ref 150–400)
RBC: 4.05 MIL/uL — ABNORMAL LOW (ref 4.22–5.81)
RDW: 17.1 % — ABNORMAL HIGH (ref 11.5–15.5)
WBC Count: 6.3 10*3/uL (ref 4.0–10.5)
nRBC: 0 % (ref 0.0–0.2)

## 2021-10-09 LAB — LACTATE DEHYDROGENASE: LDH: 101 U/L (ref 98–192)

## 2021-10-09 MED ORDER — SODIUM CHLORIDE 0.9 % IV SOLN: Freq: Once | INTRAVENOUS | Status: AC

## 2021-10-09 MED ORDER — HYPROMELLOSE (GONIOSCOPIC) 2.5 % OP SOLN: 1.0000 [drp] | Freq: Four times a day (QID) | OPHTHALMIC | 12 refills | Status: DC | PRN

## 2021-10-09 MED ORDER — HEPARIN SOD (PORK) LOCK FLUSH 100 UNIT/ML IV SOLN
500.0000 [IU] | Freq: Once | INTRAVENOUS | Status: AC | PRN
  Administered 2021-10-09: 500 [IU]

## 2021-10-09 MED ORDER — SODIUM CHLORIDE 0.9 % IV SOLN
375.0000 mg/m2 | Freq: Once | INTRAVENOUS | Status: AC
  Administered 2021-10-09: 800 mg via INTRAVENOUS
  Filled 2021-10-09: qty 50

## 2021-10-09 MED ORDER — LORAZEPAM 1 MG PO TABS
0.5000 mg | ORAL_TABLET | ORAL | Status: DC | PRN
  Administered 2021-10-09: 0.5 mg via ORAL
  Filled 2021-10-09: qty 1

## 2021-10-09 MED ORDER — PEGFILGRASTIM-BMEZ 6 MG/0.6ML ~~LOC~~ SOSY
6.0000 mg | PREFILLED_SYRINGE | Freq: Once | SUBCUTANEOUS | Status: AC
  Administered 2021-10-09: 6 mg via SUBCUTANEOUS
  Filled 2021-10-09: qty 0.6

## 2021-10-09 MED ORDER — METHYLPREDNISOLONE SODIUM SUCC 125 MG IJ SOLR
125.0000 mg | Freq: Every day | INTRAMUSCULAR | Status: DC
  Administered 2021-10-09: 125 mg via INTRAVENOUS
  Filled 2021-10-09: qty 2

## 2021-10-09 MED ORDER — SODIUM CHLORIDE 0.9% FLUSH
10.0000 mL | INTRAVENOUS | Status: DC | PRN
  Administered 2021-10-09: 10 mL

## 2021-10-09 MED ORDER — DIPHENHYDRAMINE HCL 25 MG PO CAPS
50.0000 mg | ORAL_CAPSULE | Freq: Once | ORAL | Status: AC
  Administered 2021-10-09: 50 mg via ORAL
  Filled 2021-10-09: qty 2

## 2021-10-09 MED ORDER — ARTIFICIAL TEARS OPHTHALMIC OINT: TOPICAL_OINTMENT | Freq: Every day | OPHTHALMIC | 2 refills | Status: DC

## 2021-10-09 MED ORDER — FAMOTIDINE IN NACL 20-0.9 MG/50ML-% IV SOLN
20.0000 mg | Freq: Once | INTRAVENOUS | Status: AC
  Administered 2021-10-09: 20 mg via INTRAVENOUS
  Filled 2021-10-09: qty 50

## 2021-10-09 MED ORDER — SODIUM CHLORIDE 0.9% FLUSH
10.0000 mL | Freq: Once | INTRAVENOUS | Status: AC
  Administered 2021-10-09: 10 mL

## 2021-10-09 MED ORDER — ACETAMINOPHEN 325 MG PO TABS
650.0000 mg | ORAL_TABLET | Freq: Once | ORAL | Status: AC
  Administered 2021-10-09: 650 mg via ORAL
  Filled 2021-10-09: qty 2

## 2021-10-09 NOTE — Progress Notes (Signed)
Patient arrived to outpatient appointments today and was not called post hospital, as he has already been seen and has knowledge of upcoming appointments.

## 2021-10-09 NOTE — Progress Notes (Signed)
Pt informed RN that he has new onset blurry vision that comes and goes. He stated that it is not affecting his daily life right now, but he wanted to make [place] aware.  RN informed MD of this change.  MD recommended the Pt use OTC artificial tears 3-4 times a day and prescribed Lacrilube at night.  RN educated the Pt on the MD's recommendations.  Pt verbalized understanding and stated that he would inform us if it gets worse.

## 2021-10-09 NOTE — Patient Instructions (Signed)
[facility] MEDICAL ONCOLOGY  Discharge Instructions: Thank you for choosing [facility] to provide your oncology and hematology care.   If you have a lab appointment with the Cancer Center, please go directly to the Cancer Center and check in at the registration area.   Wear comfortable clothing and clothing appropriate for easy access to any Portacath or PICC line.   We strive to give you quality time with your provider. You may need to reschedule your appointment if you arrive late (15 or more minutes).  Arriving late affects you and other patients whose appointments are after yours.  Also, if you miss three or more appointments without notifying the office, you may be dismissed from the clinic at the provider's discretion.      For prescription refill requests, have your pharmacy contact our office and allow 72 hours for refills to be completed.    Today you received the following chemotherapy and/or immunotherapy agents Rituxan      To help prevent nausea and vomiting after your treatment, we encourage you to take your nausea medication as directed.  BELOW ARE SYMPTOMS THAT SHOULD BE REPORTED IMMEDIATELY: *FEVER GREATER THAN 100.4 F (38 C) OR HIGHER *CHILLS OR SWEATING *NAUSEA AND VOMITING THAT IS NOT CONTROLLED WITH YOUR NAUSEA MEDICATION *UNUSUAL SHORTNESS OF BREATH *UNUSUAL BRUISING OR BLEEDING *URINARY PROBLEMS (pain or burning when urinating, or frequent urination) *BOWEL PROBLEMS (unusual diarrhea, constipation, pain near the anus) TENDERNESS IN MOUTH AND THROAT WITH OR WITHOUT PRESENCE OF ULCERS (sore throat, sores in mouth, or a toothache) UNUSUAL RASH, SWELLING OR PAIN  UNUSUAL VAGINAL DISCHARGE OR ITCHING   Items with * indicate a potential emergency and should be followed up as soon as possible or go to the Emergency Department if any problems should occur.  Please show the CHEMOTHERAPY ALERT CARD or IMMUNOTHERAPY ALERT CARD at check-in to the  Emergency Department and triage nurse.  Should you have questions after your visit or need to cancel or reschedule your appointment, please contact [facility] MEDICAL [facility]COL[facility]  Dept: [phone]  and follow the prompts.  Office hours are 8:00 a.m. to 4:30 p.m. Monday - Friday. Please note that voicemails left after 4:00 p.m. may not be returned until the following business day.  We are closed weekends and major holidays. You have access to a nurse at all times for urgent questions. Please call the main number to the clinic Dept: [phone] and follow the prompts.   For any non-urgent questions, you may also contact your provider using MyChart. We now offer e-Visits for anyone 18 and older to request care online for non-urgent symptoms. For details visit [email or web address].   Also download the MyChart app! Go to the app store, search "MyChart", open the app, select [facility], and log in with your MyChart username and password.  Due to Covid, a mask is required upon entering the hospital/clinic. If you do not have a mask, one will be given to you upon arrival. For doctor visits, patients may have 1 support person aged 18 or older with them. For treatment visits, patients cannot have anyone with them due to current Covid guidelines and our immunocompromised population.   

## 2021-10-16 ENCOUNTER — Inpatient Hospital Stay (HOSPITAL_BASED_OUTPATIENT_CLINIC_OR_DEPARTMENT_OTHER): Payer: No Typology Code available for payment source | Admitting: Hematology

## 2021-10-16 ENCOUNTER — Other Ambulatory Visit: Payer: Self-pay

## 2021-10-16 ENCOUNTER — Inpatient Hospital Stay: Payer: No Typology Code available for payment source | Attending: Hematology

## 2021-10-16 ENCOUNTER — Encounter: Payer: Self-pay | Admitting: Hematology

## 2021-10-16 VITALS — BP 92/67 | HR 75 | Temp 97.7°F | Resp 20 | Wt 185.3 lb

## 2021-10-16 DIAGNOSIS — Z5112 Encounter for antineoplastic immunotherapy: Secondary | ICD-10-CM | POA: Insufficient documentation

## 2021-10-16 DIAGNOSIS — C8338 Diffuse large B-cell lymphoma, lymph nodes of multiple sites: Secondary | ICD-10-CM

## 2021-10-16 DIAGNOSIS — Z5189 Encounter for other specified aftercare: Secondary | ICD-10-CM | POA: Insufficient documentation

## 2021-10-16 DIAGNOSIS — Z95828 Presence of other vascular implants and grafts: Secondary | ICD-10-CM

## 2021-10-16 DIAGNOSIS — C8590 Non-Hodgkin lymphoma, unspecified, unspecified site: Secondary | ICD-10-CM | POA: Diagnosis present

## 2021-10-16 DIAGNOSIS — Z7189 Other specified counseling: Secondary | ICD-10-CM

## 2021-10-16 DIAGNOSIS — Z5111 Encounter for antineoplastic chemotherapy: Secondary | ICD-10-CM

## 2021-10-16 LAB — CMP (CANCER CENTER ONLY)
ALT: 26 U/L (ref 0–44)
AST: 24 U/L (ref 15–41)
Albumin: 4.1 g/dL (ref 3.5–5.0)
Alkaline Phosphatase: 64 U/L (ref 38–126)
Anion gap: 6 (ref 5–15)
BUN: 9 mg/dL (ref 6–20)
CO2: 28 mmol/L (ref 22–32)
Calcium: 9.7 mg/dL (ref 8.9–10.3)
Chloride: 104 mmol/L (ref 98–111)
Creatinine: 0.84 mg/dL (ref 0.61–1.24)
GFR, Estimated: >60 mL/min (ref >60)
Glucose, Bld: 102 mg/dL — ABNORMAL HIGH (ref 70–99)
Potassium: 3.9 mmol/L (ref 3.5–5.1)
Sodium: 138 mmol/L (ref 135–145)
Total Bilirubin: 0.3 mg/dL (ref 0.3–1.2)
Total Protein: 6.5 g/dL (ref 6.5–8.1)

## 2021-10-16 LAB — CBC WITH DIFFERENTIAL (CANCER CENTER ONLY)
Abs Immature Granulocytes: 3.2 10*3/uL — ABNORMAL HIGH (ref 0.00–0.07)
Band Neutrophils: 25 %
Basophils Absolute: 0 10*3/uL (ref 0.0–0.1)
Basophils Relative: 0 %
Eosinophils Absolute: 0 10*3/uL (ref 0.0–0.5)
Eosinophils Relative: 0 %
HCT: 34 % — ABNORMAL LOW (ref 39.0–52.0)
Hemoglobin: 11.4 g/dL — ABNORMAL LOW (ref 13.0–17.0)
Lymphocytes Relative: 13 %
Lymphs Abs: 3.2 10*3/uL (ref 0.7–4.0)
MCH: 30.4 pg (ref 26.0–34.0)
MCHC: 33.5 g/dL (ref 30.0–36.0)
MCV: 90.7 fL (ref 80.0–100.0)
Metamyelocytes Relative: 7 %
Monocytes Absolute: 2.2 10*3/uL — ABNORMAL HIGH (ref 0.1–1.0)
Monocytes Relative: 9 %
Myelocytes: 6 %
Neutro Abs: 15.9 10*3/uL — ABNORMAL HIGH (ref 1.7–7.7)
Neutrophils Relative %: 40 %
Platelet Count: 210 10*3/uL (ref 150–400)
RBC: 3.75 MIL/uL — ABNORMAL LOW (ref 4.22–5.81)
RDW: 18.2 % — ABNORMAL HIGH (ref 11.5–15.5)
Smear Review: NORMAL
WBC Count: 24.5 10*3/uL — ABNORMAL HIGH (ref 4.0–10.5)
nRBC: 0.4 % — ABNORMAL HIGH (ref 0.0–0.2)

## 2021-10-16 LAB — LACTATE DEHYDROGENASE: LDH: 469 U/L — ABNORMAL HIGH (ref 98–192)

## 2021-10-16 MED ORDER — HEPARIN SOD (PORK) LOCK FLUSH 100 UNIT/ML IV SOLN
500.0000 [IU] | Freq: Once | INTRAVENOUS | Status: AC
  Administered 2021-10-16: 500 [IU]

## 2021-10-16 MED ORDER — SODIUM CHLORIDE 0.9% FLUSH
10.0000 mL | Freq: Once | INTRAVENOUS | Status: AC
  Administered 2021-10-16: 10 mL

## 2021-10-17 ENCOUNTER — Telehealth: Payer: Self-pay | Admitting: Hematology

## 2021-10-17 NOTE — Telephone Encounter (Signed)
Unable to leave message with follow-up appointment per 3/6 los. Mailed calendar. ?

## 2021-10-23 ENCOUNTER — Encounter: Payer: Self-pay | Admitting: Hematology

## 2021-10-23 ENCOUNTER — Encounter (HOSPITAL_COMMUNITY): Payer: Self-pay | Admitting: Hematology

## 2021-10-23 ENCOUNTER — Inpatient Hospital Stay (HOSPITAL_COMMUNITY)
Admission: AD | Admit: 2021-10-23 | Discharge: 2021-10-27 | DRG: 847 | Disposition: A | Discharge status: Home / Self Care | Payer: No Typology Code available for payment source | Source: Ambulatory Visit | Attending: Hematology | Admitting: Hematology

## 2021-10-23 ENCOUNTER — Other Ambulatory Visit: Payer: Self-pay

## 2021-10-23 DIAGNOSIS — Z8042 Family history of malignant neoplasm of prostate: Secondary | ICD-10-CM

## 2021-10-23 DIAGNOSIS — C8338 Diffuse large B-cell lymphoma, lymph nodes of multiple sites: Secondary | ICD-10-CM | POA: Diagnosis present

## 2021-10-23 DIAGNOSIS — Z5111 Encounter for antineoplastic chemotherapy: Secondary | ICD-10-CM | POA: Diagnosis present

## 2021-10-23 DIAGNOSIS — G629 Polyneuropathy, unspecified: Secondary | ICD-10-CM | POA: Diagnosis present

## 2021-10-23 DIAGNOSIS — Z85828 Personal history of other malignant neoplasm of skin: Secondary | ICD-10-CM

## 2021-10-23 DIAGNOSIS — Z20822 Contact with and (suspected) exposure to covid-19: Secondary | ICD-10-CM | POA: Diagnosis present

## 2021-10-23 DIAGNOSIS — K5900 Constipation, unspecified: Secondary | ICD-10-CM | POA: Diagnosis present

## 2021-10-23 DIAGNOSIS — Z7189 Other specified counseling: Secondary | ICD-10-CM

## 2021-10-23 DIAGNOSIS — N179 Acute kidney failure, unspecified: Secondary | ICD-10-CM | POA: Diagnosis present

## 2021-10-23 DIAGNOSIS — Z820 Family history of epilepsy and other diseases of the nervous system: Secondary | ICD-10-CM | POA: Diagnosis not present

## 2021-10-23 DIAGNOSIS — Z803 Family history of malignant neoplasm of breast: Secondary | ICD-10-CM | POA: Diagnosis not present

## 2021-10-23 DIAGNOSIS — Z800 Family history of malignant neoplasm of digestive organs: Secondary | ICD-10-CM | POA: Diagnosis not present

## 2021-10-23 DIAGNOSIS — E739 Lactose intolerance, unspecified: Secondary | ICD-10-CM | POA: Diagnosis present

## 2021-10-23 DIAGNOSIS — Z79899 Other long term (current) drug therapy: Secondary | ICD-10-CM

## 2021-10-23 DIAGNOSIS — Z833 Family history of diabetes mellitus: Secondary | ICD-10-CM

## 2021-10-23 LAB — COMPREHENSIVE METABOLIC PANEL
ALT: 22 U/L (ref 0–44)
AST: 21 U/L (ref 15–41)
Albumin: 4 g/dL (ref 3.5–5.0)
Alkaline Phosphatase: 58 U/L (ref 38–126)
Anion gap: 9 (ref 5–15)
BUN: 14 mg/dL (ref 6–20)
CO2: 25 mmol/L (ref 22–32)
Calcium: 9.1 mg/dL (ref 8.9–10.3)
Chloride: 106 mmol/L (ref 98–111)
Creatinine, Ser: 0.71 mg/dL (ref 0.61–1.24)
GFR, Estimated: >60 mL/min (ref >60)
Glucose, Bld: 111 mg/dL — ABNORMAL HIGH (ref 70–99)
Potassium: 3.9 mmol/L (ref 3.5–5.1)
Sodium: 140 mmol/L (ref 135–145)
Total Bilirubin: 0.2 mg/dL — ABNORMAL LOW (ref 0.3–1.2)
Total Protein: 6.5 g/dL (ref 6.5–8.1)

## 2021-10-23 LAB — CBC WITH DIFFERENTIAL/PLATELET
Abs Immature Granulocytes: 0.52 10*3/uL — ABNORMAL HIGH (ref 0.00–0.07)
Basophils Absolute: 0.2 10*3/uL — ABNORMAL HIGH (ref 0.0–0.1)
Basophils Relative: 1 %
Eosinophils Absolute: 0.1 10*3/uL (ref 0.0–0.5)
Eosinophils Relative: 1 %
HCT: 36.6 % — ABNORMAL LOW (ref 39.0–52.0)
Hemoglobin: 11.9 g/dL — ABNORMAL LOW (ref 13.0–17.0)
Immature Granulocytes: 4 %
Lymphocytes Relative: 10 %
Lymphs Abs: 1.6 10*3/uL (ref 0.7–4.0)
MCH: 30.1 pg (ref 26.0–34.0)
MCHC: 32.5 g/dL (ref 30.0–36.0)
MCV: 92.4 fL (ref 80.0–100.0)
Monocytes Absolute: 1 10*3/uL (ref 0.1–1.0)
Monocytes Relative: 7 %
Neutro Abs: 11.7 10*3/uL — ABNORMAL HIGH (ref 1.7–7.7)
Neutrophils Relative %: 77 %
Platelets: 196 10*3/uL (ref 150–400)
RBC: 3.96 MIL/uL — ABNORMAL LOW (ref 4.22–5.81)
RDW: 17.2 % — ABNORMAL HIGH (ref 11.5–15.5)
WBC: 15 10*3/uL — ABNORMAL HIGH (ref 4.0–10.5)
nRBC: 0 % (ref 0.0–0.2)

## 2021-10-23 LAB — RESP PANEL BY RT-PCR (FLU A&B, COVID) ARPGX2
Influenza A by PCR: NEGATIVE
Influenza B by PCR: NEGATIVE
SARS Coronavirus 2 by RT PCR: NEGATIVE

## 2021-10-23 LAB — LACTATE DEHYDROGENASE: LDH: 241 U/L — ABNORMAL HIGH (ref 98–192)

## 2021-10-23 MED ORDER — LORAZEPAM 0.5 MG PO TABS
0.5000 mg | ORAL_TABLET | Freq: Four times a day (QID) | ORAL | Status: DC | PRN
  Administered 2021-10-23 – 2021-10-27 (×12): 0.5 mg via ORAL
  Filled 2021-10-23 (×12): qty 1

## 2021-10-23 MED ORDER — LIDOCAINE-PRILOCAINE 2.5-2.5 % EX CREA
1.0000 "application " | TOPICAL_CREAM | Freq: Every day | CUTANEOUS | Status: DC | PRN
  Administered 2021-10-23: 1 via TOPICAL
  Filled 2021-10-23: qty 5

## 2021-10-23 MED ORDER — POLYETHYLENE GLYCOL 3350 17 G PO PACK
17.0000 g | PACK | Freq: Every day | ORAL | Status: DC
  Filled 2021-10-23 (×3): qty 1

## 2021-10-23 MED ORDER — CHLORHEXIDINE GLUCONATE CLOTH 2 % EX PADS
6.0000 | MEDICATED_PAD | Freq: Every day | CUTANEOUS | Status: DC
  Administered 2021-10-23 – 2021-10-27 (×5): 6 via TOPICAL

## 2021-10-23 MED ORDER — PANTOPRAZOLE SODIUM 40 MG PO TBEC
40.0000 mg | DELAYED_RELEASE_TABLET | Freq: Every day | ORAL | Status: DC
  Administered 2021-10-24 – 2021-10-27 (×4): 40 mg via ORAL
  Filled 2021-10-23 (×4): qty 1

## 2021-10-23 MED ORDER — SODIUM BICARBONATE/SODIUM CHLORIDE MOUTHWASH
1.0000 "application " | OROMUCOSAL | Status: DC | PRN
  Filled 2021-10-23: qty 1000

## 2021-10-23 MED ORDER — SODIUM CHLORIDE 0.9 % IV SOLN: INTRAVENOUS | Status: DC

## 2021-10-23 MED ORDER — IBUPROFEN 200 MG PO TABS: 400.0000 mg | ORAL_TABLET | Freq: Four times a day (QID) | ORAL | Status: DC | PRN

## 2021-10-23 MED ORDER — ACETAMINOPHEN 325 MG PO TABS: 650.0000 mg | ORAL_TABLET | ORAL | Status: DC | PRN

## 2021-10-23 MED ORDER — MAGIC MOUTHWASH W/LIDOCAINE
5.0000 mL | Freq: Four times a day (QID) | ORAL | Status: DC | PRN
  Filled 2021-10-23: qty 5

## 2021-10-23 MED ORDER — SODIUM CHLORIDE 0.9 % IV SOLN
Freq: Once | INTRAVENOUS | Status: AC
  Administered 2021-10-23: 68 mg via INTRAVENOUS
  Filled 2021-10-23: qty 4

## 2021-10-23 MED ORDER — SENNOSIDES-DOCUSATE SODIUM 8.6-50 MG PO TABS: 1.0000 | ORAL_TABLET | Freq: Every evening | ORAL | Status: DC | PRN

## 2021-10-23 MED ORDER — LORATADINE 10 MG PO TABS: 10.0000 mg | ORAL_TABLET | Freq: Every day | ORAL | Status: DC | PRN

## 2021-10-23 MED ORDER — POLYVINYL ALCOHOL 1.4 % OP SOLN
1.0000 [drp] | Freq: Four times a day (QID) | OPHTHALMIC | Status: DC | PRN
  Filled 2021-10-23: qty 15

## 2021-10-23 MED ORDER — POLYETHYLENE GLYCOL 3350 17 G PO PACK: 17.0000 g | PACK | Freq: Every day | ORAL | Status: DC | PRN

## 2021-10-23 MED ORDER — SODIUM CHLORIDE 0.9% FLUSH
10.0000 mL | Freq: Two times a day (BID) | INTRAVENOUS | Status: DC
  Administered 2021-10-23: 30 mL
  Administered 2021-10-24 – 2021-10-27 (×5): 10 mL

## 2021-10-23 MED ORDER — ARTIFICIAL TEARS OPHTHALMIC OINT
TOPICAL_OINTMENT | Freq: Every day | OPHTHALMIC | Status: DC
  Filled 2021-10-23: qty 3.5

## 2021-10-23 MED ORDER — HYDROCODONE-ACETAMINOPHEN 5-325 MG PO TABS: 1.0000 | ORAL_TABLET | Freq: Four times a day (QID) | ORAL | Status: DC | PRN

## 2021-10-23 MED ORDER — PROCHLORPERAZINE MALEATE 10 MG PO TABS: 10.0000 mg | ORAL_TABLET | Freq: Four times a day (QID) | ORAL | Status: DC | PRN

## 2021-10-23 MED ORDER — SODIUM CHLORIDE 0.9% FLUSH: 10.0000 mL | INTRAVENOUS | Status: DC | PRN

## 2021-10-23 MED ORDER — VINCRISTINE SULFATE CHEMO INJECTION 1 MG/ML
Freq: Once | INTRAVENOUS | Status: AC
  Filled 2021-10-23: qty 10

## 2021-10-23 MED ORDER — PREDNISONE 20 MG PO TABS
60.0000 mg | ORAL_TABLET | Freq: Every day | ORAL | Status: AC
  Administered 2021-10-23 – 2021-10-27 (×5): 60 mg via ORAL
  Filled 2021-10-23 (×5): qty 3

## 2021-10-23 NOTE — H&P (Cosign Needed)
[place]  Telephone:([phone] Fax:([phone]) [phone]   MEDICAL ONCOLOGY - ADMISSION H&P  Reason for Admission: Diffuse large B-cell lymphoma, chemotherapy administration  HPI: Mr. Capaldi is a 41 year old male with diffuse large B-cell lymphoma.  He initially received 2 cycles of R-CHOP and has tolerated well.  Although patient does not have double hit lymphoma, recommendation was made to switch to [facility] beginning with cycle #3.  He received his first cycle of  EPOCH-R beginning on 09/11/2021.  He tolerated this very well.  He had a PET scan performed following his third cycle which showed a significant response to treatment.  The patient reports that he is feeling well today.  He continues to have some blurry vision.  He also reports some very mild numbness in the fingertips.  No numbness reported in his feet or toes.  He is not having any fevers, chills, mucositis, chest pain, shortness of breath, abdominal pain, nausea, vomiting, constipation, diarrhea. The patient presents to the hospital today for admission for cycle #5 of chemotherapy.  Past Medical History:  Diagnosis Date   Allergy    Cancer ([place])    skin cancer   Herniated disc    Hx of skin cancer, basal cell    Hyperlipidemia    borderline- no meds   :   Past Surgical History:  Procedure Laterality Date   COLONOSCOPY     last 2013   ESOPHAGOGASTRODUODENOSCOPY (EGD) WITH PROPOFOL N/A 06/29/2021   Procedure: ESOPHAGOGASTRODUODENOSCOPY (EGD) WITH PROPOFOL;  Surgeon: Milus Banister, MD;  Location: [facility] ENDOSCOPY;  Service: Endoscopy;  Laterality: N/A;   EUS N/A 06/29/2021   Procedure: UPPER ENDOSCOPIC ULTRASOUND (EUS) RADIAL;  Surgeon: Milus Banister, MD;  Location: [facility] ENDOSCOPY;  Service: Endoscopy;  Laterality: N/A;   FINE NEEDLE ASPIRATION N/A 06/29/2021   Procedure: FINE NEEDLE ASPIRATION (FNA) LINEAR;  Surgeon: Milus Banister, MD;  Location: [facility] ENDOSCOPY;  Service: Endoscopy;  Laterality: N/A;    IR IMAGING GUIDED PORT INSERTION  07/26/2021   VASECTOMY    :   No current facility-administered medications for this encounter.   Current Outpatient Medications  Medication Sig Dispense Refill   acetaminophen (TYLENOL) 325 MG tablet Take 2 tablets (650 mg total) by mouth every 4 (four) hours as needed for mild pain. (Patient not taking: Reported on 10/02/2021)     artificial tears (LACRILUBE) OINT ophthalmic ointment Place into both eyes at bedtime. 5 g 2   Ascorbic Acid (VITAMIN C PO) Take 1 tablet by mouth 2 (two) times daily.     b complex vitamins capsule Take 1 capsule by mouth 2 (two) times a week.     Cholecalciferol (VITAMIN D-3 PO) Take 1 tablet by mouth 2 (two) times daily.     HYDROcodone-acetaminophen (NORCO) 5-325 MG tablet Take 1-2 tablets by mouth every 6 (six) hours as needed for moderate pain. 50 tablet 0   hydroxypropyl methylcellulose / hypromellose (ISOPTO TEARS / GONIOVISC) 2.5 % ophthalmic solution Place 1 drop into both eyes 4 (four) times daily as needed for dry eyes. 15 mL 12   ibuprofen (ADVIL) 200 MG tablet Take 400 mg by mouth every 6 (six) hours as needed (pain).     lidocaine-prilocaine (EMLA) cream Apply to affected area once (Patient taking differently: Apply 1 application topically daily as needed (prior to port access).) 30 g 3   loratadine (CLARITIN) 10 MG tablet Take 10 mg by mouth daily as needed (seasonal allergies).     LORazepam (ATIVAN) 0.5  MG tablet Take 1 tablet (0.5 mg total) by mouth every 6 (six) hours as needed (Nausea or vomiting). 30 tablet 0   magic mouthwash w/lidocaine SOLN Take 5 mLs by mouth 4 (four) times daily as needed for mouth pain. Rx: 50 mL viscous lidocaine 2% 50 mL Maalox 50 mL diphenhydramine (Benadryl) at 12.5 mg per 5 ml elixir 50 mL nystatin (100,000U per 5 mL) suspension 50 mL distilled water (Patient not taking: Reported on 10/02/2021) 250 mL 1   ondansetron (ZOFRAN) 8 MG tablet Take 1 tablet (8 mg total) by mouth 2 (two)  times daily as needed for refractory nausea / vomiting. Start on day 3 after cyclophosphamide chemotherapy. (Patient not taking: Reported on 09/12/2021) 30 tablet 1   pantoprazole (PROTONIX) 40 MG tablet Take 1 tablet (40 mg total) by mouth daily before breakfast. (Patient taking differently: Take 40 mg by mouth daily as needed (heartburn).) 30 tablet 2   polyethylene glycol (MIRALAX / GLYCOLAX) 17 g packet Take 17 g by mouth daily as needed (constipation).     prochlorperazine (COMPAZINE) 10 MG tablet Take 1 tablet (10 mg total) by mouth every 6 (six) hours as needed (Nausea or vomiting). (Patient taking differently: Take 10 mg by mouth every 6 (six) hours as needed for nausea or vomiting.) 30 tablet 6   senna-docusate (SENOKOT-S) 8.6-50 MG tablet Take 1 tablet by mouth at bedtime as needed for mild constipation. (Patient not taking: Reported on 10/02/2021)     Sodium Chloride-Sodium Bicarb (SODIUM BICARBONATE/SODIUM CHLORIDE) SOLN 1 application by Mouth Rinse route as needed for dry mouth.        Allergies  Allergen Reactions   Bee Pollen-1000-Royal Jelly [Nutritional Supplements] Anaphylaxis    And Bee's Wax - inflammatory reaction per pt    Gluten Meal Other (See Comments)    Abdominal cramping    Lactose Intolerance (Gi) Other (See Comments)    Abdominal cramping  :   Family History  Problem Relation Age of Onset   Colon cancer Mother 58   Colon polyps Mother    Colon cancer Paternal Grandfather    Alzheimer's disease Paternal Grandfather    Breast cancer Maternal Aunt    Diabetes Maternal Grandmother    Prostate cancer Maternal Grandfather    Esophageal cancer Neg Hx    Rectal cancer Neg Hx    Stomach cancer Neg Hx   :   Social History   Socioeconomic History   Marital status: Married    Spouse name: Not on file   Number of children: 1   Years of education: Not on file   Highest education level: Not on file  Occupational History   Occupation: Sales   Tobacco Use    Smoking status: Never   Smokeless tobacco: Never  Substance and Sexual Activity   Alcohol use: Not Currently    Alcohol/week: 1.0 standard drink    Types: 1 Glasses of wine per week    Comment: Rare    Drug use: No   Sexual activity: Not on file  Other Topics Concern   Not on file  Social History Narrative   Daily caffeine    Social Determinants of Health   Financial Resource Strain: Not on file  Food Insecurity: Not on file  Transportation Needs: Not on file  Physical Activity: Not on file  Stress: Not on file  Social Connections: Not on file  Intimate Partner Violence: Not on file  :  Review of Systems: A comprehensive 14 point review  of systems was negative except as noted in the HPI.  Exam: No data found.   General:  well-nourished in no acute distress.   Eyes:  no scleral icterus.   ENT:  There were no oropharyngeal lesions.   Lymphatics:  Negative cervical, supraclavicular or axillary adenopathy.   Respiratory: lungs were clear bilaterally without wheezing or crackles.   Cardiovascular:  Regular rate and rhythm, S1/S2, without murmur, rub or gallop.  There was no pedal edema.   GI:  abdomen was soft, flat, nontender, nondistended, without organomegaly.   Skin exam was without echymosis, petichae.   Neuro exam was nonfocal. Patient was alert and oriented.  Attention was good.   Language was appropriate.  Mood was normal without depression.  Speech was not pressured.  Thought content was not tangential.     Lab Results  Component Value Date   WBC 24.5 (H) 10/16/2021   HGB 11.4 (L) 10/16/2021   HCT 34.0 (L) 10/16/2021   PLT 210 10/16/2021   GLUCOSE 102 (H) 10/16/2021   ALT 26 10/16/2021   AST 24 10/16/2021   NA 138 10/16/2021   K 3.9 10/16/2021   CL 104 10/16/2021   CREATININE 0.84 10/16/2021   BUN 9 10/16/2021   CO2 28 10/16/2021    No results found.   No results found.  Assessment and Plan:   Very pleasant 41 year old gentleman with some chronic  food related bowel irregularities with   1) recently diagnosed stage IVA large B-cell lymphoma. Lymphoma FISH panel shows no evidence of c-Myc rearrangement ruling out double hit or triple hit lymphoma. Patient presented with extensive abdominal and retroperitoneal lymphadenopathy, right supraclavicular lymphadenopathy and lung nodules concerning for lymphoma involvement as well as concern for small bowel involvement. -PET scan 09/21/2021- "1. Marked interval reduction in size and activity of prior adenopathy in the and abdomen. Much of the prior adenopathy has completely resolved (Deauville 1), with some small remaining lymph nodes demonstrating Deauville 2 levels of activity. The previous abnormal hypermetabolic small bowel wall thickening in the pelvis has completely resolved. 2. Diffuse accentuated metabolic activity in the skeleton, probably attributable to granulocyte stimulation. 3. 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 Prominent stool throughout the colon favors constipation." 2) pulmonary nodules concerning for lymphoma involvement 3) 4.4 cm small bowel thickening concerning for involvement by lymphoma.  Cannot rule out adenocarcinoma but less likely. 4) acute kidney injury.  Patient's creatinine had bumped to 1.6 but on repeat is back down to normal. 5) Food intolerances including gluten and dairy avoidance.   PLAN -Baseline labs including CBC with differential, CMET, LDH have been drawn and are currently pending.  We will proceed with day 1 cycle 5 of chemotherapy after review of labs.  Will continue to check daily CBC with differential and CMET. 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  We will continue chemotherapy with same doses. -Baseline COVID-19 test has been ordered.  Results pending. -Low risk for DVT.  He requests no DVT prophylaxis.  He will ambulate on a regular basis. -Home  medications including as needed Tylenol, as needed hydrocodone, and as needed Ativan have been ordered.  I have also ordered his home eyedrops. -Protonix 40 mg daily has been ordered. -We will give MiraLAX daily.  Senokot S is also available as needed for constipation. -Rituxan and Udenyca are scheduled for outpatient on 10/30/2021.  Follow-up visit scheduled for 11/06/2021.  Mikey Bussing, DNP, AGPCNP-BC, AOCNP  Future Appointments  Date Time Provider [place]  10/30/2021  9:15 AM [facility] [place] FLUSH [facility] None  10/30/2021 10:15 AM [facility] INFUSION [facility] None  11/06/2021  2:45 PM [facility] [place] FLUSH [facility] None  11/06/2021  3:20 PM Kale, [place], MD [facility] None

## 2021-10-23 NOTE — Progress Notes (Addendum)
HEMATOLOGY/ONCOLOGY CLINIC NOTE  Date of Service: .10/16/2021   Patient Care Team: [name], MD as PCP - General (Family Medicine)  CHIEF COMPLAINTS/PURPOSE OF CONSULTATION:   Up for continued evaluation and management of double expresser large B-cell lymphoma and toxicity check after cycle 4 of treatment  HISTORY OF PRESENTING ILLNESS:   Please see previous notes for details on initial presentation  INTERVAL HISTORY  [name]  is here for follow-up for continued valuation and management of large B-cell lymphoma.  He has completed his fourth cycle of chemotherapy and second cycle of dose adjusted R-EPOCH. He notes no acute new toxicities from his last visit.  No fevers no chills no night sweats no unexpected weight loss. Good p.o. intake. No overt significant mucositis. Has been doing oral mouthwashes. Labs done today reviewed in details with the patient. No other acute new symptoms.  MEDICAL HISTORY:  Past Medical History:  Diagnosis Date   Allergy    Cancer ([place])    skin cancer   Herniated disc    Hx of skin cancer, basal cell    Hyperlipidemia    borderline- no meds     SURGICAL HISTORY: Past Surgical History:  Procedure Laterality Date   COLONOSCOPY     last 2013   ESOPHAGOGASTRODUODENOSCOPY (EGD) WITH PROPOFOL N/A 06/29/2021   Procedure: ESOPHAGOGASTRODUODENOSCOPY (EGD) WITH PROPOFOL;  Surgeon: Milus Banister, MD;  Location: [facility];  Service: Endoscopy;  Laterality: N/A;   EUS N/A 06/29/2021   Procedure: UPPER ENDOSCOPIC ULTRASOUND (EUS) RADIAL;  Surgeon: Milus Banister, MD;  Location: [facility];  Service: Endoscopy;  Laterality: N/A;   FINE NEEDLE ASPIRATION N/A 06/29/2021   Procedure: FINE NEEDLE ASPIRATION (FNA) LINEAR;  Surgeon: Milus Banister, MD;  Location: [facility];  Service: Endoscopy;  Laterality: N/A;   IR IMAGING GUIDED PORT INSERTION  07/26/2021   VASECTOMY      SOCIAL HISTORY: Social History    Socioeconomic History   Marital status: Married    Spouse name: Not on file   Number of children: 1   Years of education: Not on file   Highest education level: Not on file  Occupational History   Occupation: Sales   Tobacco Use   Smoking status: Never   Smokeless tobacco: Never  Substance and Sexual Activity   Alcohol use: Not Currently    Alcohol/week: 1.0 standard drink    Types: 1 Glasses of wine per week    Comment: Rare    Drug use: No   Sexual activity: Not on file  Other Topics Concern   Not on file  Social History Narrative   Daily caffeine    Social Determinants of Health   Financial Resource Strain: Not on file  Food Insecurity: Not on file  Transportation Needs: Not on file  Physical Activity: Not on file  Stress: Not on file  Social Connections: Not on file  Intimate Partner Violence: Not on file    FAMILY HISTORY: Family History  Problem Relation Age of Onset   Colon cancer Mother 67   Colon polyps Mother    Colon cancer Paternal Grandfather    Alzheimer's disease Paternal Grandfather    Breast cancer Maternal Aunt    Diabetes Maternal Grandmother    Prostate cancer Maternal Grandfather    Esophageal cancer Neg Hx    Rectal cancer Neg Hx    Stomach cancer Neg Hx     ALLERGIES:  is allergic to bee pollen-1000-royal jelly [nutritional  supplements], gluten meal, and lactose intolerance (gi).  MEDICATIONS:  Current Outpatient Medications  Medication Sig Dispense Refill   acetaminophen (TYLENOL) 325 MG tablet Take 2 tablets (650 mg total) by mouth every 4 (four) hours as needed for mild pain. (Patient not taking: Reported on 10/02/2021)     artificial tears (LACRILUBE) OINT ophthalmic ointment Place into both eyes at bedtime. 5 g 2   Ascorbic Acid (VITAMIN C PO) Take 1 tablet by mouth 2 (two) times daily.     b complex vitamins capsule Take 1 capsule by mouth 2 (two) times a week.     Cholecalciferol (VITAMIN D-3 PO) Take 1 tablet by mouth 2 (two)  times daily.     HYDROcodone-acetaminophen (NORCO) 5-325 MG tablet Take 1-2 tablets by mouth every 6 (six) hours as needed for moderate pain. 50 tablet 0   hydroxypropyl methylcellulose / hypromellose (ISOPTO TEARS / GONIOVISC) 2.5 % ophthalmic solution Place 1 drop into both eyes 4 (four) times daily as needed for dry eyes. 15 mL 12   ibuprofen (ADVIL) 200 MG tablet Take 400 mg by mouth every 6 (six) hours as needed (pain).     lidocaine-prilocaine (EMLA) cream Apply to affected area once (Patient taking differently: Apply 1 application topically daily as needed (prior to port access).) 30 g 3   loratadine (CLARITIN) 10 MG tablet Take 10 mg by mouth daily as needed (seasonal allergies).     LORazepam (ATIVAN) 0.5 MG tablet Take 1 tablet (0.5 mg total) by mouth every 6 (six) hours as needed (Nausea or vomiting). 30 tablet 0   magic mouthwash w/lidocaine SOLN Take 5 mLs by mouth 4 (four) times daily as needed for mouth pain. Rx: 50 mL viscous lidocaine 2% 50 mL Maalox 50 mL diphenhydramine (Benadryl) at 12.5 mg per 5 ml elixir 50 mL nystatin (100,000U per 5 mL) suspension 50 mL distilled water (Patient not taking: Reported on 10/02/2021) 250 mL 1   ondansetron (ZOFRAN) 8 MG tablet Take 1 tablet (8 mg total) by mouth 2 (two) times daily as needed for refractory nausea / vomiting. Start on day 3 after cyclophosphamide chemotherapy. (Patient not taking: Reported on 09/12/2021) 30 tablet 1   pantoprazole (PROTONIX) 40 MG tablet Take 1 tablet (40 mg total) by mouth daily before breakfast. (Patient taking differently: Take 40 mg by mouth daily as needed (heartburn).) 30 tablet 2   polyethylene glycol (MIRALAX / GLYCOLAX) 17 g packet Take 17 g by mouth daily as needed (constipation).     prochlorperazine (COMPAZINE) 10 MG tablet Take 1 tablet (10 mg total) by mouth every 6 (six) hours as needed (Nausea or vomiting). (Patient taking differently: Take 10 mg by mouth every 6 (six) hours as needed for nausea or  vomiting.) 30 tablet 6   senna-docusate (SENOKOT-S) 8.6-50 MG tablet Take 1 tablet by mouth at bedtime as needed for mild constipation. (Patient not taking: Reported on 10/02/2021)     Sodium Chloride-Sodium Bicarb (SODIUM BICARBONATE/SODIUM CHLORIDE) SOLN 1 application by Mouth Rinse route as needed for dry mouth.     No current facility-administered medications for this visit.    REVIEW OF SYSTEMS:   10 Point review of Systems was done is negative except as noted above.  PHYSICAL EXAMINATION: ECOG PERFORMANCE STATUS: 1 - Symptomatic but completely ambulatory  Vitals:   10/16/21 1105  BP: 92/67  Pulse: 75  Resp: 20  Temp: 97.7 F (36.5 C)  SpO2: 100%   Filed Weights   10/16/21 1105  Weight: 185  lb 4.8 oz (84.1 kg)   .Body mass index is 23.79 kg/m. NAD GENERAL:alert, in no acute distress and comfortable SKIN: no acute rashes, no significant lesions EYES: conjunctiva are pink and non-injected, sclera anicteric OROPHARYNX: MMM, no exudates, no oropharyngeal erythema or ulceration NECK: supple, no JVD LYMPH:  no palpable lymphadenopathy in the cervical, axillary or inguinal regions LUNGS: clear to auscultation b/l with normal respiratory effort HEART: regular rate & rhythm ABDOMEN:  normoactive bowel sounds , non tender, not distended. Extremity: no pedal edema PSYCH: alert & oriented x 3 with fluent speech NEURO: no focal motor/sensory deficits   LABORATORY DATA:   I have reviewed the data as listed  CBC Latest Ref Rng & Units 10/16/2021 10/09/2021 10/06/2021  WBC 4.0 - 10.5 K/uL 24.5(H) 6.3 10.6(H)  Hemoglobin 13.0 - 17.0 g/dL 11.4(L) 12.1(L) 11.0(L)  Hematocrit 39.0 - 52.0 % 34.0(L) 35.6(L) 32.4(L)  Platelets 150 - 400 K/uL 210 284 215    . CMP Latest Ref Rng & Units 10/16/2021 10/09/2021 10/06/2021  Glucose 70 - 99 mg/dL 102(H) 86 106(H)  BUN 6 - 20 mg/dL 9 19 22(H)  Creatinine 0.61 - 1.24 mg/dL 0.84 0.66 0.83  Sodium 135 - 145 mmol/L 138 136 138  Potassium 3.5 -  5.1 mmol/L 3.9 4.1 3.7  Chloride 98 - 111 mmol/L 104 100 106  CO2 22 - 32 mmol/L '28 31 26  '$ Calcium 8.9 - 10.3 mg/dL 9.7 9.5 8.9  Total Protein 6.5 - 8.1 g/dL 6.5 6.2(L) 5.6(L)  Total Bilirubin 0.3 - 1.2 mg/dL 0.3 0.5 0.5  Alkaline Phos 38 - 126 U/L 64 39 41  AST 15 - 41 U/L 24 7(L) 15  ALT 0 - 44 U/L '26 12 17   '$ . Lab Results  Component Value Date   LDH 469 (H) 10/16/2021    Component     Latest Ref Rng & Units 07/11/2021  Color, Urine     YELLOW YELLOW  Appearance     CLEAR CLEAR  Specific Gravity, Urine     1.005 - 1.030 1.016  pH     5.0 - 8.0 5.0  Glucose, UA     NEGATIVE mg/dL NEGATIVE  Hgb urine dipstick     NEGATIVE NEGATIVE  Bilirubin Urine     NEGATIVE NEGATIVE  Ketones, ur     NEGATIVE mg/dL 20 (A)  Protein     NEGATIVE mg/dL NEGATIVE  Nitrite     NEGATIVE NEGATIVE  Leukocytes,Ua     NEGATIVE NEGATIVE  HIV Screen 4th Generation wRfx     Non Reactive Non Reactive  Hep B Core Total Ab     NON REACTIVE NON REACTIVE  Hepatitis B Surface Ag     NON REACTIVE NON REACTIVE  HCV Ab     NON REACTIVE NON REACTIVE  Uric Acid, Serum     3.7 - 8.6 mg/dL 8.0  LDH     98 - 192 U/L 300 (H)   SURGICAL PATHOLOGY  CASE: [phone]  PATIENT: [name]  Surgical Pathology Report      Specimen Submitted:  A. Lymph node, right neck   Clinical History: History of malignant non Hodgkin's lymphoma after EUS  biopsy on 11/17, however sample was insufficient to subclassify for  treatment planning.  Now post US guided bx of hypermetabolic right  supraclavicular lymph node       DIAGNOSIS:  A. LYMPH NODE, RIGHT NECK; ULTRASOUND-GUIDED BIOPSY:  - LARGE B-CELL LYMPHOMA.  - SEE COMMENT.   Comment:  Sections demonstrate sheets of large, markedly atypical cells with  prominent macronucleoli.  Mitotic activity is brisk.  Flow cytometry  demonstrates a CD5 negative, CD10 negative monoclonal B cell population.  Definitive classification is dependent on  prognostically significant IHC  and FISH studies, which will be reported in an addendum.      RADIOGRAPHIC STUDIES: I have personally reviewed the radiological images as listed and agreed with the findings in the report. No results found.  ASSESSMENT & PLAN:   Very pleasant 41 year old gentleman with some chronic food related bowel irregularities with  1) Stage IVA large B-cell lymphoma NOS.  Double expressor ABC type non-germinal center (activated B cell) immunophenotype. Co-expression of BCL2 and C-MYC by IHC may be associated with a more aggressive clinical course.   Immunohistochemical studies demonstrate the tumor cells to be positive  for CD20, BCL2, BCL6, MUM1, and C-MYC, and negative for CD5, CD10, CD30,  and EBER ISH.   non-germinal center (activated B cell) immunophenotype.  Co-expression of BCL2 and C-MYC by IHC may be associated with a more aggressive clinical course.   Lymphoma FISH panel shows no evidence of c-Myc rearrangement ruling out double hit or triple hit lymphoma. Patient presented with extensive abdominal and retroperitoneal lymphadenopathy, right supraclavicular lymphadenopathy and lung nodules concerning for lymphoma involvement as well as concern for small bowel involvement. 2) pulmonary nodules concerning for lymphoma involvement 3) 4.4 cm small bowel thickening concerning for involvement by lymphoma.  Cannot rule out adenocarcinoma but less likely. 4) acute kidney injury.  Patient's creatinine had bumped to 1.6 but on repeat is back down to normal. 5) Food intolerances including gluten and dairy avoidance.  PLAN -We discussed lab results from today CBC shows hemoglobin of 11.4, platelets of 210k, elevated WBC count of 24.5k suggesting leukocytosis likely from his Udenyca shot. LDH elevated likely from bone marrow stimulation by G-CSF CMP unremarkable -Patient has no significant notable toxicities from cycle 4 of chemoimmunotherapy. -We will continue  dose adjusted EPOCH at same doses with without any additional dose escalation.  Same supportive treatments. -Chemotherapy orders reviewed and signed -Outpatient Udenyca and Rituxan on day 7 of treatment.  Follow-up note: Cycle 3 of [facility] inpatient chemotherapy for 5 days from 10/23/2021 Outpatient Udenyca and Rituxan on 10/30/2021 Port flush and labs and MD visit on 11/06/2021   The total time spent in the appointment was 30 minutes*.  All of the patient's questions were answered with apparent satisfaction. The patient knows to call the clinic with any problems, questions or concerns.   Sullivan Lone MD MS AAHIVMS [facility] Hematology/Oncology Physician [facility]  .*Total Encounter Time as defined by the Centers for Medicare and Medicaid Services includes, in addition to the face-to-face time of a patient visit (documented in the note above) non-face-to-face time: obtaining and reviewing outside history, ordering and reviewing medications, tests or procedures, care coordination (communications with other health care professionals or caregivers) and documentation in the medical record.

## 2021-10-23 NOTE — Progress Notes (Signed)
Chaplain provided paperwork for Advance Directives.  Allen Hodge wishes to review them and will let us know if he wants to fill it out and notarize. ? ?[employer], Bcc ?Pager, [phone] ? ?

## 2021-10-24 ENCOUNTER — Encounter: Payer: Self-pay | Admitting: Hematology

## 2021-10-24 DIAGNOSIS — C8338 Diffuse large B-cell lymphoma, lymph nodes of multiple sites: Secondary | ICD-10-CM | POA: Diagnosis not present

## 2021-10-24 DIAGNOSIS — Z5111 Encounter for antineoplastic chemotherapy: Secondary | ICD-10-CM | POA: Diagnosis not present

## 2021-10-24 LAB — COMPREHENSIVE METABOLIC PANEL
ALT: 23 U/L (ref 0–44)
AST: 20 U/L (ref 15–41)
Albumin: 4 g/dL (ref 3.5–5.0)
Alkaline Phosphatase: 53 U/L (ref 38–126)
Anion gap: 10 (ref 5–15)
BUN: 15 mg/dL (ref 6–20)
CO2: 23 mmol/L (ref 22–32)
Calcium: 9.2 mg/dL (ref 8.9–10.3)
Chloride: 105 mmol/L (ref 98–111)
Creatinine, Ser: 0.74 mg/dL (ref 0.61–1.24)
GFR, Estimated: >60 mL/min (ref >60)
Glucose, Bld: 126 mg/dL — ABNORMAL HIGH (ref 70–99)
Potassium: 4 mmol/L (ref 3.5–5.1)
Sodium: 138 mmol/L (ref 135–145)
Total Bilirubin: 0.2 mg/dL — ABNORMAL LOW (ref 0.3–1.2)
Total Protein: 6.6 g/dL (ref 6.5–8.1)

## 2021-10-24 LAB — CBC WITH DIFFERENTIAL/PLATELET
Abs Immature Granulocytes: 0.47 10*3/uL — ABNORMAL HIGH (ref 0.00–0.07)
Basophils Absolute: 0.1 10*3/uL (ref 0.0–0.1)
Basophils Relative: 0 %
Eosinophils Absolute: 0 10*3/uL (ref 0.0–0.5)
Eosinophils Relative: 0 %
HCT: 35.9 % — ABNORMAL LOW (ref 39.0–52.0)
Hemoglobin: 11.9 g/dL — ABNORMAL LOW (ref 13.0–17.0)
Immature Granulocytes: 2 %
Lymphocytes Relative: 5 %
Lymphs Abs: 1 10*3/uL (ref 0.7–4.0)
MCH: 30.2 pg (ref 26.0–34.0)
MCHC: 33.1 g/dL (ref 30.0–36.0)
MCV: 91.1 fL (ref 80.0–100.0)
Monocytes Absolute: 0.5 10*3/uL (ref 0.1–1.0)
Monocytes Relative: 2 %
Neutro Abs: 19.7 10*3/uL — ABNORMAL HIGH (ref 1.7–7.7)
Neutrophils Relative %: 91 %
Platelets: 194 10*3/uL (ref 150–400)
RBC: 3.94 MIL/uL — ABNORMAL LOW (ref 4.22–5.81)
RDW: 16.9 % — ABNORMAL HIGH (ref 11.5–15.5)
WBC: 21.7 10*3/uL — ABNORMAL HIGH (ref 4.0–10.5)
nRBC: 0 % (ref 0.0–0.2)

## 2021-10-24 MED ORDER — VINCRISTINE SULFATE CHEMO INJECTION 1 MG/ML
Freq: Once | INTRAVENOUS | Status: AC
  Filled 2021-10-24: qty 10

## 2021-10-24 MED ORDER — SODIUM CHLORIDE 0.9 % IV SOLN
Freq: Once | INTRAVENOUS | Status: AC
  Administered 2021-10-24: 8 mg via INTRAVENOUS
  Filled 2021-10-24: qty 4

## 2021-10-24 NOTE — Progress Notes (Signed)
?  Transition of Care (TOC) Screening Note ? ? ?Patient Details  ?Name: Allen Hodge ?Date of Birth: 1980/12/30 ? ? ?Transition of Care (TOC) CM/SW Contact:    ?Lianah Peed, Marjie Skiff, RN ?Phone Number: ?10/24/2021, 1:33 PM ? ? ? ?Transition of Care Department [facility]) has reviewed patient and no TOC needs have been identified at this time. We will continue to monitor patient advancement through interdisciplinary progression rounds. If new patient transition needs arise, please place a TOC consult. ?  ?

## 2021-10-24 NOTE — Progress Notes (Addendum)
HEMATOLOGY-ONCOLOGY PROGRESS NOTE ? ?ASSESSMENT AND PLAN: ?Very pleasant 41 year old gentleman with some chronic food related bowel irregularities with ?  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 ?Lymphoma FISH panel shows no evidence of c-Myc rearrangement ruling out double hit or triple hit lymphoma. ?Patient presented with extensive abdominal and retroperitoneal lymphadenopathy, right supraclavicular lymphadenopathy and lung nodules concerning for lymphoma involvement as well as concern for small bowel involvement. ?-PET scan 09/21/2021- "1. Marked interval reduction in size and activity of prior adenopathy in the and abdomen. Much of the prior adenopathy has completely resolved (Deauville 1), with some small remaining lymph nodes demonstrating Deauville 2 levels of activity. The previous abnormal hypermetabolic small bowel wall thickening in the pelvis has completely resolved. 2. Diffuse accentuated metabolic activity in the skeleton, probably attributable to granulocyte stimulation. 3. 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 Prominent stool throughout the colon favors constipation." ?2) pulmonary nodules concerning for lymphoma involvement ?3) 4.4 cm small bowel thickening concerning for involvement by lymphoma.  Cannot rule out adenocarcinoma but less likely. ?4) acute kidney injury.  Patient's creatinine had bumped to 1.6 but on repeat is back down to normal. ?5) Food intolerances including gluten and dairy avoidance. ?  ?PLAN ?-Labs from this morning have been reviewed.  His WBC is elevated likely due to prednisone.  Hemoglobin stable.  Platelets normal.  Glucose elevated at 126.  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  Labs are adequate to proceed with day 2 of chemotherapy as planned.  We will continue to check a daily CBC with differential and CMET. ?-Low risk for DVT.  Holding  DVT prophylaxis per patient request.  He is ambulating on a regular basis. ?-Continue home medications and Protonix 40 mg daily. ?-Continue daily MiraLAX and has Senokot-S available to him as needed for constipation. ?-Rituxan and Ellen Henri are scheduled already at the cancer center on day 8 and he has a follow-up visit on 11/06/2021. ? ?Mikey Bussing, DNP, AGPCNP-BC, AOCNP ? ?SUBJECTIVE: Feels well overall this morning.  Continues to have some mild peripheral neuropathy in his fingertips which is transient.  He is not having any nausea or vomiting.  Continues to have blurred vision which is unchanged from previous reports. ? ?Oncology History  ?Diffuse large B-cell lymphoma of lymph nodes of multiple regions [facility])  ?07/21/2021 Initial Diagnosis  ? Diffuse large B-cell lymphoma of lymph nodes of multiple regions [facility]) ?  ?07/27/2021 - 08/19/2021 Chemotherapy  ? Patient is on Treatment Plan : NON-HODGKINS LYMPHOMA R-CHOP q21d  ?   ?09/04/2021 Cancer Staging  ? Staging form: Hodgkin and Non-Hodgkin Lymphoma, AJCC 8th Edition ?- Clinical: Stage IV (Diffuse large B-cell lymphoma) - Signed by Brunetta Genera, MD on 09/04/2021 ?Stage prefix: Initial diagnosis ?  ?09/11/2021 -  Chemotherapy  ? Patient is on Treatment Plan : IP NON-HODGKINS LYMPHOMA EPOCH q21d  ?   ?09/18/2021 -  Chemotherapy  ? Patient is on Treatment Plan : NON-HODGKINS LYMPHOMA Rituximab q21d  ?   ? ? ? ?REVIEW OF SYSTEMS:   ?Review of Systems  ?Constitutional: Negative.   ?HENT: Negative.    ?Eyes: Negative.   ?Respiratory: Negative.    ?Cardiovascular: Negative.   ?Gastrointestinal: Negative.  Negative for nausea and vomiting.  ?Genitourinary: Negative.   ?Musculoskeletal: Negative.   ?Skin: Negative.   ?Neurological:   ?     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  ?Psychiatric/Behavioral: Negative.    ? ?  I have reviewed the past medical history, past surgical history, social history and family history with the patient and they are  unchanged from previous note. ? ? ?PHYSICAL EXAMINATION: ?ECOG PERFORMANCE STATUS: 1 - Symptomatic but completely ambulatory ? ?Vitals:  ? 10/23/21 1830 10/23/21 2249  ?BP: 111/68 (!) 95/54  ?Pulse:  100  ?Resp: 18 18  ?Temp: 98.4 ?F (36.9 ?C) 98.1 ?F (36.7 ?C)  ?SpO2: 100% 95%  ? ?Filed Weights  ? 10/23/21 1033  ?Weight: 84.8 kg  ? ? ?Intake/Output from previous day: ?03/13 0701 - 03/14 0700 ?In: 956.4 [P.O.:240; I.V.:113.1; IV Piggyback:603.3] ?Out: -  ? ?Physical Exam ?Vitals reviewed.  ?Constitutional:   ?   General: He is not in acute distress. ?HENT:  ?   Head: Normocephalic.  ?   Mouth/Throat:  ?   Mouth: Mucous membranes are moist.  ?   Pharynx: No oropharyngeal exudate.  ?Eyes:  ?   General: No scleral icterus. ?   Conjunctiva/sclera: Conjunctivae normal.  ?Cardiovascular:  ?   Rate and Rhythm: Normal rate and regular rhythm.  ?Pulmonary:  ?   Effort: Pulmonary effort is normal.  ?   Breath sounds: Normal breath sounds.  ?Abdominal:  ?   General: Bowel sounds are normal.  ?   Palpations: Abdomen is soft.  ?   Tenderness: There is no abdominal tenderness.  ?Musculoskeletal:  ?   Right lower leg: No edema.  ?   Left lower leg: No edema.  ?Skin: ?   General: Skin is warm and dry.  ?Neurological:  ?   Mental Status: He is alert and oriented to person, place, and time.  ?Psychiatric:     ?   Mood and Affect: Mood normal.     ?   Behavior: Behavior normal.     ?   Thought Content: Thought content normal.     ?   Judgment: Judgment normal.  ? ? ?LABORATORY DATA:  ?I have reviewed the data as listed ?CMP Latest Ref Rng & Units 10/24/2021 10/23/2021 10/16/2021  ?Glucose 70 - 99 mg/dL 126(H) 111(H) 102(H)  ?BUN 6 - 20 mg/dL '15 14 9  '$ ?Creatinine 0.61 - 1.24 mg/dL 0.74 0.71 0.84  ?Sodium 135 - 145 mmol/L 138 140 138  ?Potassium 3.5 - 5.1 mmol/L 4.0 3.9 3.9  ?Chloride 98 - 111 mmol/L 105 106 104  ?CO2 22 - 32 mmol/L '23 25 28  '$ ?Calcium 8.9 - 10.3 mg/dL 9.2 9.1 9.7  ?Total Protein 6.5 - 8.1 g/dL 6.6 6.5 6.5  ?Total Bilirubin  0.3 - 1.2 mg/dL 0.2(L) 0.2(L) 0.3  ?Alkaline Phos 38 - 126 U/L 53 58 64  ?AST 15 - 41 U/L '20 21 24  '$ ?ALT 0 - 44 U/L '23 22 26  '$ ? ? ?Lab Results  ?Component Value Date  ? WBC 21.7 (H) 10/24/2021  ? HGB 11.9 (L) 10/24/2021  ? HCT 35.9 (L) 10/24/2021  ? MCV 91.1 10/24/2021  ? PLT 194 10/24/2021  ? NEUTROABS 19.7 (H) 10/24/2021  ? ? ?No results found for: CEA1, CEA, [id number], CA125, PSA1 ? ?No results found. ? ? ?Future Appointments  ?Date Time Provider [place]  ?10/30/2021  9:15 AM [facility] FLUSH [facility] None  ?10/30/2021 10:15 AM [facility] INFUSION [facility] None  ?11/06/2021  2:45 PM [facility] FLUSH [facility] None  ?11/06/2021  3:20 PM Brunetta Genera, MD [facility] None  ?  ? ? LOS: 1 day  ? ? ?ADDENDUM ? ?.Patient was Personally and independently interviewed, examined  and relevant elements of the history of present illness were reviewed in details and an assessment and plan was created. All elements of the patient's history of present illness , assessment and plan were discussed in details with Krisitin Curcio DNP. The above documentation reflects our combined findings assessment and plan. ?  ?Sullivan Lone MD MS ? ?

## 2021-10-25 ENCOUNTER — Encounter: Payer: Self-pay | Admitting: Hematology

## 2021-10-25 DIAGNOSIS — C8338 Diffuse large B-cell lymphoma, lymph nodes of multiple sites: Secondary | ICD-10-CM | POA: Diagnosis not present

## 2021-10-25 DIAGNOSIS — Z5111 Encounter for antineoplastic chemotherapy: Secondary | ICD-10-CM | POA: Diagnosis not present

## 2021-10-25 LAB — CBC WITH DIFFERENTIAL/PLATELET
Abs Immature Granulocytes: 0.27 10*3/uL — ABNORMAL HIGH (ref 0.00–0.07)
Basophils Absolute: 0 10*3/uL (ref 0.0–0.1)
Basophils Relative: 0 %
Eosinophils Absolute: 0 10*3/uL (ref 0.0–0.5)
Eosinophils Relative: 0 %
HCT: 32.9 % — ABNORMAL LOW (ref 39.0–52.0)
Hemoglobin: 11.4 g/dL — ABNORMAL LOW (ref 13.0–17.0)
Immature Granulocytes: 1 %
Lymphocytes Relative: 5 %
Lymphs Abs: 1.1 10*3/uL (ref 0.7–4.0)
MCH: 31.6 pg (ref 26.0–34.0)
MCHC: 34.7 g/dL (ref 30.0–36.0)
MCV: 91.1 fL (ref 80.0–100.0)
Monocytes Absolute: 1.5 10*3/uL — ABNORMAL HIGH (ref 0.1–1.0)
Monocytes Relative: 7 %
Neutro Abs: 19 10*3/uL — ABNORMAL HIGH (ref 1.7–7.7)
Neutrophils Relative %: 87 %
Platelets: 187 10*3/uL (ref 150–400)
RBC: 3.61 MIL/uL — ABNORMAL LOW (ref 4.22–5.81)
RDW: 17.3 % — ABNORMAL HIGH (ref 11.5–15.5)
WBC: 21.9 10*3/uL — ABNORMAL HIGH (ref 4.0–10.5)
nRBC: 0 % (ref 0.0–0.2)

## 2021-10-25 LAB — COMPREHENSIVE METABOLIC PANEL
ALT: 22 U/L (ref 0–44)
AST: 18 U/L (ref 15–41)
Albumin: 3.8 g/dL (ref 3.5–5.0)
Alkaline Phosphatase: 49 U/L (ref 38–126)
Anion gap: 8 (ref 5–15)
BUN: 18 mg/dL (ref 6–20)
CO2: 24 mmol/L (ref 22–32)
Calcium: 9.4 mg/dL (ref 8.9–10.3)
Chloride: 107 mmol/L (ref 98–111)
Creatinine, Ser: 0.78 mg/dL (ref 0.61–1.24)
GFR, Estimated: >60 mL/min (ref >60)
Glucose, Bld: 114 mg/dL — ABNORMAL HIGH (ref 70–99)
Potassium: 3.8 mmol/L (ref 3.5–5.1)
Sodium: 139 mmol/L (ref 135–145)
Total Bilirubin: 0.2 mg/dL — ABNORMAL LOW (ref 0.3–1.2)
Total Protein: 6.2 g/dL — ABNORMAL LOW (ref 6.5–8.1)

## 2021-10-25 MED ORDER — VINCRISTINE SULFATE CHEMO INJECTION 1 MG/ML
Freq: Once | INTRAVENOUS | Status: AC
  Filled 2021-10-25: qty 10

## 2021-10-25 MED ORDER — SODIUM CHLORIDE 0.9 % IV SOLN
Freq: Once | INTRAVENOUS | Status: AC
  Administered 2021-10-25: 18 mg via INTRAVENOUS
  Filled 2021-10-25: qty 4

## 2021-10-25 NOTE — Discharge Planning (Signed)
Oncology Discharge Planning Note ? ?[facility] ?Address: [address], [place], [place] [address] ?Hours of Operation:  8am - 5pm, Monday - Friday  ?Clinic Contact Information:  [phone] ? ?Oncology Care Team: ?Medical Oncologist:  Dr. Sullivan Lone ? ?Patient Details: ?Name:  Allen Hodge, Allen Hodge ?MRN:   [id number] ?DOB:   October 06, 1980 ?Reason for Current Admission: Diffuse large B-cell lymphoma of lymph nodes of multiple sites [facility]) ? ?Discharge Planning Narrative: ?Discharge follow-up appointments for oncology are current and available on the AVS and MyChart.   ?Upon discharge from the hospital, hematology/oncology's post discharge plan of care for the outpatient setting is: 10/30/21 for PF/Lab and treatment, 11/06/21 PF/Lab and ov with Dr. Irene Limbo.  ? ?Jenny Omdahl will be called within two business days after discharge to review hematology/oncology's plan of care for full understanding.   ? ?Outpatient Oncology Specific Care Only: ?Oncology appointment transportation needs addressed?:  yes ?Oncology medication management for symptom management addressed?:  not applicable ?Chemo Alert Card reviewed?:  not applicable ?Immunotherapy Alert Card reviewed?:  not applicable ?

## 2021-10-26 ENCOUNTER — Encounter: Payer: Self-pay | Admitting: Hematology

## 2021-10-26 DIAGNOSIS — C8338 Diffuse large B-cell lymphoma, lymph nodes of multiple sites: Secondary | ICD-10-CM | POA: Diagnosis not present

## 2021-10-26 DIAGNOSIS — Z5111 Encounter for antineoplastic chemotherapy: Secondary | ICD-10-CM | POA: Diagnosis not present

## 2021-10-26 LAB — COMPREHENSIVE METABOLIC PANEL
ALT: 23 U/L (ref 0–44)
AST: 15 U/L (ref 15–41)
Albumin: 3.7 g/dL (ref 3.5–5.0)
Alkaline Phosphatase: 44 U/L (ref 38–126)
Anion gap: 7 (ref 5–15)
BUN: 24 mg/dL — ABNORMAL HIGH (ref 6–20)
CO2: 25 mmol/L (ref 22–32)
Calcium: 9.1 mg/dL (ref 8.9–10.3)
Chloride: 106 mmol/L (ref 98–111)
Creatinine, Ser: 0.75 mg/dL (ref 0.61–1.24)
GFR, Estimated: >60 mL/min (ref >60)
Glucose, Bld: 101 mg/dL — ABNORMAL HIGH (ref 70–99)
Potassium: 3.6 mmol/L (ref 3.5–5.1)
Sodium: 138 mmol/L (ref 135–145)
Total Bilirubin: 0.3 mg/dL (ref 0.3–1.2)
Total Protein: 6.3 g/dL — ABNORMAL LOW (ref 6.5–8.1)

## 2021-10-26 LAB — CBC WITH DIFFERENTIAL/PLATELET
Abs Immature Granulocytes: 0.12 10*3/uL — ABNORMAL HIGH (ref 0.00–0.07)
Basophils Absolute: 0 10*3/uL (ref 0.0–0.1)
Basophils Relative: 0 %
Eosinophils Absolute: 0 10*3/uL (ref 0.0–0.5)
Eosinophils Relative: 0 %
HCT: 33.7 % — ABNORMAL LOW (ref 39.0–52.0)
Hemoglobin: 11 g/dL — ABNORMAL LOW (ref 13.0–17.0)
Immature Granulocytes: 1 %
Lymphocytes Relative: 8 %
Lymphs Abs: 1.2 10*3/uL (ref 0.7–4.0)
MCH: 29.9 pg (ref 26.0–34.0)
MCHC: 32.6 g/dL (ref 30.0–36.0)
MCV: 91.6 fL (ref 80.0–100.0)
Monocytes Absolute: 1.4 10*3/uL — ABNORMAL HIGH (ref 0.1–1.0)
Monocytes Relative: 10 %
Neutro Abs: 12 10*3/uL — ABNORMAL HIGH (ref 1.7–7.7)
Neutrophils Relative %: 81 %
Platelets: 194 10*3/uL (ref 150–400)
RBC: 3.68 MIL/uL — ABNORMAL LOW (ref 4.22–5.81)
RDW: 17.2 % — ABNORMAL HIGH (ref 11.5–15.5)
WBC: 14.7 10*3/uL — ABNORMAL HIGH (ref 4.0–10.5)
nRBC: 0 % (ref 0.0–0.2)

## 2021-10-26 MED ORDER — SODIUM CHLORIDE 0.9 % IV SOLN
Freq: Once | INTRAVENOUS | Status: AC
  Administered 2021-10-27: 36 mg via INTRAVENOUS
  Filled 2021-10-26: qty 3

## 2021-10-26 MED ORDER — VINCRISTINE SULFATE CHEMO INJECTION 1 MG/ML
Freq: Once | INTRAVENOUS | Status: AC
  Filled 2021-10-26: qty 10

## 2021-10-26 MED ORDER — SODIUM CHLORIDE 0.9 % IV SOLN
750.0000 mg/m2 | Freq: Once | INTRAVENOUS | Status: AC
  Administered 2021-10-27: 1520 mg via INTRAVENOUS
  Filled 2021-10-26: qty 76

## 2021-10-26 MED ORDER — SODIUM CHLORIDE 0.9 % IV SOLN
Freq: Once | INTRAVENOUS | Status: AC
  Administered 2021-10-26: 18 mg via INTRAVENOUS
  Filled 2021-10-26: qty 4

## 2021-10-26 MED ORDER — LORAZEPAM 0.5 MG PO TABS: 0.5000 mg | ORAL_TABLET | Freq: Four times a day (QID) | ORAL | 0 refills | Status: DC | PRN

## 2021-10-26 NOTE — Progress Notes (Signed)
. ? ?HEMATOLOGY/ONCOLOGY INPATIENT PROGRESS NOTE ? ?Date of Service: 10/26/2021 ? ?Inpatient Attending: .Brunetta Genera, MD ? ? ?SUBJECTIVE ?Patient was seen in oncologic follow-up.  Receiving cycle 5-day 3 of EPOCH.  No significant toxicities overnight.  No uncontrolled nausea. ?Labs stable. ?Patient in good spirits. ?Good p.o. intake.  Resting well. ?Has been active doing his remote work. ?Notes some mild intermittent visual blurring recommended that he see an ophthalmologist as outpatient.  No ocular pain redness or abnormal discharge. ? ?OBJECTIVE: ? ?NAD ? ?PHYSICAL EXAMINATION: ?. ?Vitals:  ? 10/25/21 0518 10/25/21 1435 10/25/21 2048 10/26/21 0602  ?BP: (!) 94/59 116/81 101/67 104/68  ?Pulse: 73 78 73 (!) 52  ?Resp: '17 15 14 14  '$ ?Temp: 97.7 ?F (36.5 ?C) 97.9 ?F (36.6 ?C) 98.1 ?F (36.7 ?C) 97.9 ?F (36.6 ?C)  ?TempSrc: Oral Oral Oral Oral  ?SpO2: 97% 97% 96% 99%  ?Weight:      ?Height:      ? ?Filed Weights  ? 10/23/21 1033  ?Weight: 187 lb (84.8 kg)  ? ?.Body mass index is 24.01 kg/m?. ? ?GENERAL:alert, in no acute distress and comfortable ?SKIN: skin color, texture, turgor are normal, no rashes or significant lesions ?EYES: normal, conjunctiva are pink and non-injected, sclera clear ?OROPHARYNX:no exudate, no erythema and lips, buccal mucosa, and tongue normal  ?NECK: supple, no JVD, thyroid normal size, non-tender, without nodularity ?LYMPH:  no palpable lymphadenopathy in the cervical, axillary or inguinal ?LUNGS: clear to auscultation with normal respiratory effort ?HEART: regular rate & rhythm,  no murmurs and no lower extremity edema ?ABDOMEN: abdomen soft, non-tender, normoactive bowel sounds  ?Musculoskeletal: no cyanosis of digits and no clubbing  ?PSYCH: alert & oriented x 3 with fluent speech ?NEURO: no focal motor/sensory deficits ? ?MEDICAL HISTORY:  ?Past Medical History:  ?Diagnosis Date  ? Allergy   ? Cancer [facility])   ? skin cancer  ? Herniated disc   ? Hx of skin cancer, basal cell   ?  Hyperlipidemia   ? borderline- no meds   ? ? ?SURGICAL HISTORY: ?Past Surgical History:  ?Procedure Laterality Date  ? COLONOSCOPY    ? last 2013  ? ESOPHAGOGASTRODUODENOSCOPY (EGD) WITH PROPOFOL N/A 06/29/2021  ? Procedure: ESOPHAGOGASTRODUODENOSCOPY (EGD) WITH PROPOFOL;  Surgeon: Milus Banister, MD;  Location: [facility];  Service: Endoscopy;  Laterality: N/A;  ? EUS N/A 06/29/2021  ? Procedure: UPPER ENDOSCOPIC ULTRASOUND (EUS) RADIAL;  Surgeon: Milus Banister, MD;  Location: [facility];  Service: Endoscopy;  Laterality: N/A;  ? FINE NEEDLE ASPIRATION N/A 06/29/2021  ? Procedure: FINE NEEDLE ASPIRATION (FNA) LINEAR;  Surgeon: Milus Banister, MD;  Location: [facility];  Service: Endoscopy;  Laterality: N/A;  ? IR IMAGING GUIDED PORT INSERTION  07/26/2021  ? VASECTOMY    ? ? ?SOCIAL HISTORY: ?Social History  ? ?Socioeconomic History  ? Marital status: Married  ?  Spouse name: Not on file  ? Number of children: 1  ? Years of education: Not on file  ? Highest education level: Not on file  ?Occupational History  ? Occupation: Sales   ?Tobacco Use  ? Smoking status: Never  ? Smokeless tobacco: Never  ?Substance and Sexual Activity  ? Alcohol use: Not Currently  ?  Alcohol/week: 1.0 standard drink  ?  Types: 1 Glasses of wine per week  ?  Comment: Rare   ? Drug use: No  ? Sexual activity: Not on file  ?Other Topics Concern  ? Not on file  ?Social History  Narrative  ? Daily caffeine   ? ?Social Determinants of Health  ? ?Financial Resource Strain: Not on file  ?Food Insecurity: Not on file  ?Transportation Needs: Not on file  ?Physical Activity: Not on file  ?Stress: Not on file  ?Social Connections: Not on file  ?Intimate Partner Violence: Not on file  ? ? ?FAMILY HISTORY: ?Family History  ?Problem Relation Age of Onset  ? Colon cancer Mother 38  ? Colon polyps Mother   ? Colon cancer Paternal Grandfather   ? Alzheimer's disease Paternal Grandfather   ? Breast cancer Maternal Aunt   ? Diabetes Maternal  Grandmother   ? Prostate cancer Maternal Grandfather   ? Esophageal cancer Neg Hx   ? Rectal cancer Neg Hx   ? Stomach cancer Neg Hx   ? ? ?ALLERGIES:  is allergic to bee pollen-1000-royal jelly [nutritional supplements], gluten meal, and lactose intolerance (gi). ? ?MEDICATIONS:  ?Scheduled Meds: ? artificial tears   Both Eyes QHS  ? Chlorhexidine Gluconate Cloth  6 each Topical Daily  ? [START ON 10/27/2021] cyclophosphamide  750 mg/m2 (Treatment Plan Recorded) Intravenous Once  ? DOXOrubicin/vinCRIStine/etoposide CHEMO IV infusion for Inpatient CI   Intravenous Once  ? pantoprazole  40 mg Oral QAC breakfast  ? polyethylene glycol  17 g Oral Daily  ? predniSONE  60 mg Oral QAC breakfast  ? sodium chloride flush  10-40 mL Intracatheter Q12H  ? ?Continuous Infusions: ? sodium chloride 10 mL/hr at 10/26/21 0310  ? [START ON 10/27/2021] ondansetron (ZOFRAN) with dexamethasone (DECADRON) IV    ? ?PRN Meds:.acetaminophen, HYDROcodone-acetaminophen, ibuprofen, lidocaine-prilocaine, loratadine, LORazepam, magic mouthwash w/lidocaine, polyvinyl alcohol, prochlorperazine, senna-docusate, sodium bicarbonate/sodium chloride, sodium chloride flush ? ?REVIEW OF SYSTEMS:   ? ?10 Point review of Systems was done is negative except as noted above. ? ? ?LABORATORY DATA:  ?I have reviewed the data as listed ? ?. ?CBC Latest Ref Rng & Units 10/25/2021 10/24/2021  ?WBC 4.0 - 10.5 K/uL 21.9(H) 21.7(H)  ?Hemoglobin 13.0 - 17.0 g/dL 11.4(L) 11.9(L)  ?Hematocrit 39.0 - 52.0 % 32.9(L) 35.9(L)  ?Platelets 150 - 400 K/uL 187 194  ? ? ?. ?CMP Latest Ref Rng & Units 10/25/2021 10/24/2021  ?Glucose 70 - 99 mg/dL 114(H) 126(H)  ?BUN 6 - 20 mg/dL 18 15  ?Creatinine 0.61 - 1.24 mg/dL 0.78 0.74  ?Sodium 135 - 145 mmol/L 139 138  ?Potassium 3.5 - 5.1 mmol/L 3.8 4.0  ?Chloride 98 - 111 mmol/L 107 105  ?CO2 22 - 32 mmol/L 24 23  ?Calcium 8.9 - 10.3 mg/dL 9.4 9.2  ?Total Protein 6.5 - 8.1 g/dL 6.2(L) 6.6  ?Total Bilirubin 0.3 - 1.2 mg/dL 0.2(L) 0.2(L)   ?Alkaline Phos 38 - 126 U/L 49 53  ?AST 15 - 41 U/L 18 20  ?ALT 0 - 44 U/L 22 23  ? ?. ?Lab Results  ?Component Value Date  ? LDH 241 (H) 10/23/2021  ? ? ?RADIOGRAPHIC STUDIES: ?I have personally reviewed the radiological images as listed and agreed with the findings in the report. ?No results found. ? ?ASSESSMENT & PLAN:  ? ?Very pleasant 41 year old gentleman with some chronic food related bowel irregularities with ?  ?1) Stage IVA large B-cell lymphoma. ?Lymphoma FISH panel shows no evidence of c-Myc rearrangement ruling out double hit or triple hit lymphoma. ?Patient presented with extensive abdominal and retroperitoneal lymphadenopathy, right supraclavicular lymphadenopathy and lung nodules concerning for lymphoma involvement as well as concern for small bowel involvement. ?-PET scan 09/21/2021- "1. Marked interval reduction  in size and activity of prior adenopathy in the and abdomen. Much of the prior adenopathy has completely resolved (Deauville 1), with some small remaining lymph nodes demonstrating Deauville 2 levels of activity. The previous abnormal hypermetabolic small bowel wall thickening in the pelvis has completely resolved. 2. Diffuse accentuated metabolic activity in the skeleton, probably attributable to granulocyte stimulation. 3. 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 Prominent stool throughout the colon favors constipation." ?2) Pulmonary nodules concerning for lymphoma involvement ?3) 4.4 cm small bowel thickening concerning for involvement by lymphoma.  Cannot rule out adenocarcinoma but less likely. ?4) acute kidney injury.  Patient's creatinine had bumped to 1.6 but on repeat is back down to normal. ?5) Food intolerances including gluten and dairy avoidance. ?  ?PLAN ?-Labs from 10/25/2021 were reviewed and discussed with the patient. ?-Patient having no other acute new toxicities  overnight. ?-Neuropathy is minimal and only the fingertips.  Not painful.  No changes since starting the cycle of chemotherapy.  We discussed and patient is agreeable to continue same dose of chemotherapy. ?-We will proceed with d

## 2021-10-26 NOTE — Progress Notes (Addendum)
HEMATOLOGY-ONCOLOGY PROGRESS NOTE ? ?ASSESSMENT AND PLAN: ?Very pleasant 41 year old gentleman with some chronic food related bowel irregularities with ?  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 ?Lymphoma FISH panel shows no evidence of c-Myc rearrangement ruling out double hit or triple hit lymphoma. ?Patient presented with extensive abdominal and retroperitoneal lymphadenopathy, right supraclavicular lymphadenopathy and lung nodules concerning for lymphoma involvement as well as concern for small bowel involvement. ?-PET scan 09/21/2021- "1. Marked interval reduction in size and activity of prior adenopathy in the and abdomen. Much of the prior adenopathy has completely resolved (Deauville 1), with some small remaining lymph nodes demonstrating Deauville 2 levels of activity. The previous abnormal hypermetabolic small bowel wall thickening in the pelvis has completely resolved. 2. Diffuse accentuated metabolic activity in the skeleton, probably attributable to granulocyte stimulation. 3. 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 Prominent stool throughout the colon favors constipation." ?2) pulmonary nodules concerning for lymphoma involvement ?3) 4.4 cm small bowel thickening concerning for involvement by lymphoma.  Cannot rule out adenocarcinoma but less likely. ?4) acute kidney injury.  Patient's creatinine had bumped to 1.6 but on repeat is back down to normal. ?5) Food intolerances including gluten and dairy avoidance. ?  ?PLAN ?-Labs from this morning have been reviewed.  WBC is mildly elevated at 14.7, hemoglobin is 11.0 which is trending downward slightly.  His platelet count is normal.  Creatinine and liver function stable.  He will proceed with day 4 of chemotherapy today as planned.  We will recheck a CBC with differential and CMET again in the morning. ?-Low risk for DVT.  Holding DVT  prophylaxis per patient request.  He is ambulating on a regular basis. ?-Continue home medications and Protonix 40 mg daily. ?-Continue daily MiraLAX and has Senokot-S available to him as needed for constipation. ?-Rituxan and Ellen Henri are scheduled already at the cancer center on day 8 and he has a follow-up visit on 11/06/2021. ?-Anticipate hospital discharge on 10/27/2021 once chemotherapy is completed.  Outpatient follow-up appointments are already scheduled. ? ?Mikey Bussing, DNP, AGPCNP-BC, AOCNP ? ?SUBJECTIVE: Continues to tolerate chemotherapy well overall.  No nausea or vomiting reported today.  No new complaints. ? ?Oncology History  ?Diffuse large B-cell lymphoma of lymph nodes of multiple regions [facility])  ?07/21/2021 Initial Diagnosis  ? Diffuse large B-cell lymphoma of lymph nodes of multiple regions [facility]) ?  ?07/27/2021 - 08/19/2021 Chemotherapy  ? Patient is on Treatment Plan : NON-HODGKINS LYMPHOMA R-CHOP q21d  ?   ?09/04/2021 Cancer Staging  ? Staging form: Hodgkin and Non-Hodgkin Lymphoma, AJCC 8th Edition ?- Clinical: Stage IV (Diffuse large B-cell lymphoma) - Signed by Brunetta Genera, MD on 09/04/2021 ?Stage prefix: Initial diagnosis ?  ?09/11/2021 -  Chemotherapy  ? Patient is on Treatment Plan : IP NON-HODGKINS LYMPHOMA EPOCH q21d  ?   ?09/18/2021 -  Chemotherapy  ? Patient is on Treatment Plan : NON-HODGKINS LYMPHOMA Rituximab q21d  ?   ? ? ? ?REVIEW OF SYSTEMS:   ?Review of Systems  ?Constitutional: Negative.   ?HENT: Negative.    ?Eyes: Negative.   ?Respiratory: Negative.    ?Cardiovascular: Negative.   ?Gastrointestinal: Negative.  Negative for nausea and vomiting.  ?Genitourinary: Negative.   ?Musculoskeletal: Negative.   ?Skin: Negative.   ?Neurological:   ?     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  ?Psychiatric/Behavioral: Negative.    ? ?I have reviewed the past medical history, past  surgical history, social history and family history with the patient and they are  unchanged from previous note. ? ? ?PHYSICAL EXAMINATION: ?ECOG PERFORMANCE STATUS: 1 - Symptomatic but completely ambulatory ? ?Vitals:  ? 10/25/21 2048 10/26/21 0602  ?BP: 101/67 104/68  ?Pulse: 73 (!) 52  ?Resp: 14 14  ?Temp: 98.1 ?F (36.7 ?C) 97.9 ?F (36.6 ?C)  ?SpO2: 96% 99%  ? ?Filed Weights  ? 10/23/21 1033  ?Weight: 84.8 kg  ? ? ?Intake/Output from previous day: ?03/15 0701 - 03/16 0700 ?In: 1640.3 [P.O.:720; I.V.:232.6; IV Piggyback:687.7] ?Out: -  ? ?Physical Exam ?Vitals reviewed.  ?Constitutional:   ?   General: He is not in acute distress. ?HENT:  ?   Head: Normocephalic.  ?   Mouth/Throat:  ?   Mouth: Mucous membranes are moist.  ?   Pharynx: No oropharyngeal exudate.  ?Eyes:  ?   General: No scleral icterus. ?   Conjunctiva/sclera: Conjunctivae normal.  ?Cardiovascular:  ?   Rate and Rhythm: Normal rate and regular rhythm.  ?Pulmonary:  ?   Effort: Pulmonary effort is normal.  ?   Breath sounds: Normal breath sounds.  ?Abdominal:  ?   General: Bowel sounds are normal.  ?   Palpations: Abdomen is soft.  ?   Tenderness: There is no abdominal tenderness.  ?Musculoskeletal:  ?   Right lower leg: No edema.  ?   Left lower leg: No edema.  ?Skin: ?   General: Skin is warm and dry.  ?Neurological:  ?   Mental Status: He is alert and oriented to person, place, and time.  ?Psychiatric:     ?   Mood and Affect: Mood normal.     ?   Behavior: Behavior normal.     ?   Thought Content: Thought content normal.     ?   Judgment: Judgment normal.  ? ? ?LABORATORY DATA:  ?I have reviewed the data as listed ?CMP Latest Ref Rng & Units 10/26/2021 10/25/2021 10/24/2021  ?Glucose 70 - 99 mg/dL 101(H) 114(H) 126(H)  ?BUN 6 - 20 mg/dL 24(H) 18 15  ?Creatinine 0.61 - 1.24 mg/dL 0.75 0.78 0.74  ?Sodium 135 - 145 mmol/L 138 139 138  ?Potassium 3.5 - 5.1 mmol/L 3.6 3.8 4.0  ?Chloride 98 - 111 mmol/L 106 107 105  ?CO2 22 - 32 mmol/L '25 24 23  '$ ?Calcium 8.9 - 10.3 mg/dL 9.1 9.4 9.2  ?Total Protein 6.5 - 8.1 g/dL 6.3(L) 6.2(L) 6.6   ?Total Bilirubin 0.3 - 1.2 mg/dL 0.3 0.2(L) 0.2(L)  ?Alkaline Phos 38 - 126 U/L 44 49 53  ?AST 15 - 41 U/L '15 18 20  '$ ?ALT 0 - 44 U/L '23 22 23  '$ ? ? ?Lab Results  ?Component Value Date  ? WBC 14.7 (H) 10/26/2021  ? HGB 11.0 (L) 10/26/2021  ? HCT 33.7 (L) 10/26/2021  ? MCV 91.6 10/26/2021  ? PLT 194 10/26/2021  ? NEUTROABS 12.0 (H) 10/26/2021  ? ? ?No results found for: CEA1, CEA, [id number], CA125, PSA1 ? ?No results found. ? ? ?Future Appointments  ?Date Time Provider [place]  ?10/30/2021  9:15 AM [facility] [place] FLUSH [facility] None  ?10/30/2021 10:15 AM [facility] INFUSION [facility] None  ?11/06/2021  2:45 PM [facility] [place] FLUSH [facility] None  ?11/06/2021  3:20 PM Brunetta Genera, MD [facility] None  ?  ? ? LOS: 3 days  ? ? ?ADDENDUM ? ?.Patient was Personally and independently interviewed, examined and relevant elements of the history of present  illness were reviewed in details and an assessment and plan was created. All elements of the patient's history of present illness , assessment and plan were discussed in details with Krisitin Curcio DNP. The above documentation reflects our combined findings assessment and plan. ?  ?Sullivan Lone MD MS ? ?

## 2021-10-27 ENCOUNTER — Encounter: Payer: Self-pay | Admitting: Hematology

## 2021-10-27 ENCOUNTER — Other Ambulatory Visit: Payer: Self-pay | Admitting: Hematology

## 2021-10-27 ENCOUNTER — Other Ambulatory Visit: Payer: Self-pay

## 2021-10-27 DIAGNOSIS — C8338 Diffuse large B-cell lymphoma, lymph nodes of multiple sites: Secondary | ICD-10-CM | POA: Diagnosis not present

## 2021-10-27 DIAGNOSIS — Z5111 Encounter for antineoplastic chemotherapy: Secondary | ICD-10-CM | POA: Diagnosis not present

## 2021-10-27 LAB — CBC WITH DIFFERENTIAL/PLATELET
Abs Immature Granulocytes: 0.05 10*3/uL (ref 0.00–0.07)
Basophils Absolute: 0 10*3/uL (ref 0.0–0.1)
Basophils Relative: 0 %
Eosinophils Absolute: 0 10*3/uL (ref 0.0–0.5)
Eosinophils Relative: 0 %
HCT: 31.3 % — ABNORMAL LOW (ref 39.0–52.0)
Hemoglobin: 10.5 g/dL — ABNORMAL LOW (ref 13.0–17.0)
Immature Granulocytes: 1 %
Lymphocytes Relative: 11 %
Lymphs Abs: 1 10*3/uL (ref 0.7–4.0)
MCH: 30.3 pg (ref 26.0–34.0)
MCHC: 33.5 g/dL (ref 30.0–36.0)
MCV: 90.5 fL (ref 80.0–100.0)
Monocytes Absolute: 0.7 10*3/uL (ref 0.1–1.0)
Monocytes Relative: 8 %
Neutro Abs: 6.9 10*3/uL (ref 1.7–7.7)
Neutrophils Relative %: 80 %
Platelets: 201 10*3/uL (ref 150–400)
RBC: 3.46 MIL/uL — ABNORMAL LOW (ref 4.22–5.81)
RDW: 16.7 % — ABNORMAL HIGH (ref 11.5–15.5)
WBC: 8.6 10*3/uL (ref 4.0–10.5)
nRBC: 0 % (ref 0.0–0.2)

## 2021-10-27 LAB — COMPREHENSIVE METABOLIC PANEL
ALT: 21 U/L (ref 0–44)
AST: 16 U/L (ref 15–41)
Albumin: 3.3 g/dL — ABNORMAL LOW (ref 3.5–5.0)
Alkaline Phosphatase: 37 U/L — ABNORMAL LOW (ref 38–126)
Anion gap: 8 (ref 5–15)
BUN: 26 mg/dL — ABNORMAL HIGH (ref 6–20)
CO2: 24 mmol/L (ref 22–32)
Calcium: 8.7 mg/dL — ABNORMAL LOW (ref 8.9–10.3)
Chloride: 105 mmol/L (ref 98–111)
Creatinine, Ser: 0.69 mg/dL (ref 0.61–1.24)
GFR, Estimated: >60 mL/min (ref >60)
Glucose, Bld: 105 mg/dL — ABNORMAL HIGH (ref 70–99)
Potassium: 3.7 mmol/L (ref 3.5–5.1)
Sodium: 137 mmol/L (ref 135–145)
Total Bilirubin: 0.3 mg/dL (ref 0.3–1.2)
Total Protein: 5.3 g/dL — ABNORMAL LOW (ref 6.5–8.1)

## 2021-10-27 MED ORDER — HEPARIN SOD (PORK) LOCK FLUSH 100 UNIT/ML IV SOLN
500.0000 [IU] | Freq: Once | INTRAVENOUS | Status: AC
  Administered 2021-10-27: 500 [IU] via INTRAVENOUS
  Filled 2021-10-27: qty 5

## 2021-10-27 NOTE — Discharge Summary (Addendum)
Discharge Summary  ?Patient ID: ?Allen Hodge ?MRN: [id number] ?DOB/AGE: November 21, 1980 41 y.o. ? ?Admit date: 10/23/2021 ?Discharge date: 10/27/2021 ? ?Discharge Diagnoses:  ?Principal Problem: ?  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  Diffuse large B-cell lymphoma of lymph nodes of multiple regions [facility]) ?  Encounter for antineoplastic chemotherapy ? ? ?Discharged Condition: good ? ?Discharge Labs:  ?CBC ?   ?Component Value Date/Time  ? WBC 8.6 10/27/2021 0608  ? RBC 3.46 (L) 10/27/2021 7829  ? HGB 10.5 (L) 10/27/2021 5621  ? HGB 11.4 (L) 10/16/2021 1040  ? HCT 31.3 (L) 10/27/2021 3086  ? PLT 201 10/27/2021 0608  ? PLT 210 10/16/2021 1040  ? MCV 90.5 10/27/2021 0608  ? MCH 30.3 10/27/2021 0608  ? MCHC 33.5 10/27/2021 0608  ? RDW 16.7 (H) 10/27/2021 5784  ? LYMPHSABS 1.0 10/27/2021 0608  ? MONOABS 0.7 10/27/2021 0608  ? EOSABS 0.0 10/27/2021 6962  ? BASOSABS 0.0 10/27/2021 9528  ? ?CMP Latest Ref Rng & Units 10/27/2021 10/26/2021 10/25/2021  ?Glucose 70 - 99 mg/dL 105(H) 101(H) 114(H)  ?BUN 6 - 20 mg/dL 26(H) 24(H) 18  ?Creatinine 0.61 - 1.24 mg/dL 0.69 0.75 0.78  ?Sodium 135 - 145 mmol/L 137 138 139  ?Potassium 3.5 - 5.1 mmol/L 3.7 3.6 3.8  ?Chloride 98 - 111 mmol/L 105 106 107  ?CO2 22 - 32 mmol/L '24 25 24  '$ ?Calcium 8.9 - 10.3 mg/dL 8.7(L) 9.1 9.4  ?Total Protein 6.5 - 8.1 g/dL 5.3(L) 6.3(L) 6.2(L)  ?Total Bilirubin 0.3 - 1.2 mg/dL 0.3 0.3 0.2(L)  ?Alkaline Phos 38 - 126 U/L 37(L) 44 49  ?AST 15 - 41 U/L '16 15 18  '$ ?ALT 0 - 44 U/L '21 23 22  '$ ?  ?Consults: None ? ?Procedures: None ? ?Disposition:  ?Discharge disposition: 01-Home or Self Care ? ? ? ? ? ?Allergies as of 10/27/2021   ? ?   Reactions  ? Bee Pollen-1000-royal Jelly [nutritional Supplements] Anaphylaxis  ? And Bee's Wax - inflammatory reaction per pt   ? Gluten Meal Other (See Comments)  ? Abdominal cramping   ? Lactose Intolerance (gi) Other (See Comments)  ? Abdominal cramping  ? ?  ? ?  ?Medication List  ?   ? ?TAKE these medications   ? ?acetaminophen 325 MG tablet ?Commonly known as: TYLENOL ?Take 2 tablets (650 mg total) by mouth every 4 (four) hours as needed for mild pain. ?  ?artificial tears Oint ophthalmic ointment ?Commonly known as: LACRILUBE ?Place into both eyes at bedtime. ?  ?b complex vitamins capsule ?Take 1 capsule by mouth 2 (two) times a week. ?  ?HYDROcodone-acetaminophen 5-325 MG tablet ?Commonly known as: Norco ?Take 1-2 tablets by mouth every 6 (six) hours as needed for moderate pain. ?  ?hydroxypropyl methylcellulose / hypromellose 2.5 % ophthalmic solution ?Commonly known as: ISOPTO TEARS / GONIOVISC ?Place 1 drop into both eyes 4 (four) times daily as needed for dry eyes. ?  ?ibuprofen 200 MG tablet ?Commonly known as: ADVIL ?Take 400 mg by mouth every 6 (six) hours as needed (pain). ?  ?lidocaine-prilocaine cream ?Commonly known as: EMLA ?Apply to affected area once ?What changed:  ?how much to take ?how to take this ?when to take this ?reasons to take this ?additional instructions ?  ?loratadine 10 MG tablet ?Commonly known as: CLARITIN ?Take 10 mg by mouth daily as needed (seasonal allergies). ?  ?LORazepam 0.5 MG tablet ?Commonly known as: Ativan ?Take 1  tablet (0.5 mg total) by mouth every 6 (six) hours as needed (Nausea or vomiting). ?  ?magic mouthwash w/lidocaine Soln ?Take 5 mLs by mouth 4 (four) times daily as needed for mouth pain. Rx: ?50 mL viscous lidocaine 2% ?50 mL Maalox ?50 mL diphenhydramine (Benadryl) at 12.5 mg per 5 ml elixir ?50 mL nystatin (100,000U per 5 mL) suspension ?50 mL distilled water ?  ?ondansetron 8 MG tablet ?Commonly known as: Zofran ?Take 1 tablet (8 mg total) by mouth 2 (two) times daily as needed for refractory nausea / vomiting. Start on day 3 after cyclophosphamide chemotherapy. ?  ?pantoprazole 40 MG tablet ?Commonly known as: PROTONIX ?Take 1 tablet (40 mg total) by mouth daily before breakfast. ?What changed:  ?when to take this ?reasons to take  this ?  ?polyethylene glycol 17 g packet ?Commonly known as: MIRALAX / GLYCOLAX ?Take 17 g by mouth daily as needed (constipation). ?  ?prochlorperazine 10 MG tablet ?Commonly known as: COMPAZINE ?Take 1 tablet (10 mg total) by mouth every 6 (six) hours as needed (Nausea or vomiting). ?  ?senna-docusate 8.6-50 MG tablet ?Commonly known as: Senokot-S ?Take 1 tablet by mouth at bedtime as needed for mild constipation. ?  ?sodium bicarbonate/sodium chloride Soln ?1 application by Mouth Rinse route as needed for dry mouth. ?What changed: when to take this ?  ?VITAMIN C PO ?Take 1 tablet by mouth 2 (two) times daily. ?  ?VITAMIN D-3 PO ?Take 1 tablet by mouth 2 (two) times daily. ?  ? ?  ? ?HPI: [name] is a 41 year old male with diffuse large B-cell lymphoma. He initially received 2 cycles of R-CHOP and has tolerated well.  Although patient does not have double hit lymphoma, recommendation was made to switch to [facility] beginning with cycle #3.  He received his first cycle of EPOCH-R beginning on 09/11/2021.  He tolerated this very well.  He had a PET scan performed after his third cycle of chemotherapy which showed a significant response to treatment. ? ?On the day of admission, he was feeling well.  He continued to have some blurred vision.  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  He was not having any fevers, chills, mucositis, chest pain, shortness of breath, abdominal pain, nausea, vomiting, constipation, diarrhea.  He was admitted to the hospital on 10/23/2021 for cycle #5 of chemotherapy. ? ?Hospital Course: Chemotherapy started as planned on the day of admission.  He overall tolerated his chemotherapy well with no reported side effects.  Peripheral neuropathy unchanged.  Recommend for him to take a vitamin B complex vitamin and he can also try PEA 400 to 600 mg daily.  Blurred vision unchanged.  A refill for his Ativan was sent to his outpatient pharmacy.  Outpatient  follow-up appointments have been scheduled as noted below. ? ?Discharge Instructions   ? ? Diet general   Complete by: As directed ?  ? Increase activity slowly   Complete by: As directed ?  ? ?  ? ?Future Appointments  ?Date Time Provider [place]  ?10/30/2021  9:15 AM [facility] [place] FLUSH [facility] None  ?10/30/2021 10:15 AM [facility] INFUSION [facility] None  ?11/06/2021  2:45 PM [facility] [place] FLUSH [facility] None  ?11/06/2021  3:20 PM Brunetta Genera, MD [facility] None  ? ?Signed: ?Mikey Bussing ?10/27/2021, 9:51 AM  ? ? ?ADDENDUM ? ?.Patient was Personally and independently interviewed, examined and relevant elements of the discharge plan were reviewed in details and were discussed in  details with Mikey Bussing. The above documentation reflects our combined findings assessment and plan. ?  ?Sullivan Lone MD MS ?TT>30 mins ? ? ?

## 2021-10-30 ENCOUNTER — Other Ambulatory Visit: Payer: Self-pay | Admitting: Hematology

## 2021-10-30 ENCOUNTER — Other Ambulatory Visit: Payer: Self-pay

## 2021-10-30 ENCOUNTER — Inpatient Hospital Stay: Payer: No Typology Code available for payment source

## 2021-10-30 VITALS — BP 106/70 | HR 95 | Temp 98.5°F | Resp 18

## 2021-10-30 DIAGNOSIS — C8338 Diffuse large B-cell lymphoma, lymph nodes of multiple sites: Secondary | ICD-10-CM

## 2021-10-30 DIAGNOSIS — Z5112 Encounter for antineoplastic immunotherapy: Secondary | ICD-10-CM | POA: Diagnosis present

## 2021-10-30 DIAGNOSIS — Z7189 Other specified counseling: Secondary | ICD-10-CM

## 2021-10-30 DIAGNOSIS — C8590 Non-Hodgkin lymphoma, unspecified, unspecified site: Secondary | ICD-10-CM | POA: Diagnosis present

## 2021-10-30 DIAGNOSIS — Z95828 Presence of other vascular implants and grafts: Secondary | ICD-10-CM

## 2021-10-30 DIAGNOSIS — Z5189 Encounter for other specified aftercare: Secondary | ICD-10-CM | POA: Diagnosis not present

## 2021-10-30 LAB — CMP (CANCER CENTER ONLY)
ALT: 13 U/L (ref 0–44)
AST: 8 U/L — ABNORMAL LOW (ref 15–41)
Albumin: 4 g/dL (ref 3.5–5.0)
Alkaline Phosphatase: 42 U/L (ref 38–126)
Anion gap: 7 (ref 5–15)
BUN: 18 mg/dL (ref 6–20)
CO2: 29 mmol/L (ref 22–32)
Calcium: 9.2 mg/dL (ref 8.9–10.3)
Chloride: 100 mmol/L (ref 98–111)
Creatinine: 0.68 mg/dL (ref 0.61–1.24)
GFR, Estimated: >60 mL/min (ref >60)
Glucose, Bld: 144 mg/dL — ABNORMAL HIGH (ref 70–99)
Potassium: 3.3 mmol/L — ABNORMAL LOW (ref 3.5–5.1)
Sodium: 136 mmol/L (ref 135–145)
Total Bilirubin: 0.5 mg/dL (ref 0.3–1.2)
Total Protein: 6.2 g/dL — ABNORMAL LOW (ref 6.5–8.1)

## 2021-10-30 LAB — CBC WITH DIFFERENTIAL (CANCER CENTER ONLY)
Abs Immature Granulocytes: 0.07 10*3/uL (ref 0.00–0.07)
Basophils Absolute: 0 10*3/uL (ref 0.0–0.1)
Basophils Relative: 0 %
Eosinophils Absolute: 0.2 10*3/uL (ref 0.0–0.5)
Eosinophils Relative: 2 %
HCT: 32 % — ABNORMAL LOW (ref 39.0–52.0)
Hemoglobin: 11 g/dL — ABNORMAL LOW (ref 13.0–17.0)
Immature Granulocytes: 1 %
Lymphocytes Relative: 9 %
Lymphs Abs: 0.9 10*3/uL (ref 0.7–4.0)
MCH: 30.3 pg (ref 26.0–34.0)
MCHC: 34.4 g/dL (ref 30.0–36.0)
MCV: 88.2 fL (ref 80.0–100.0)
Monocytes Absolute: 0.1 10*3/uL (ref 0.1–1.0)
Monocytes Relative: 1 %
Neutro Abs: 8.3 10*3/uL — ABNORMAL HIGH (ref 1.7–7.7)
Neutrophils Relative %: 87 %
Platelet Count: 267 10*3/uL (ref 150–400)
RBC: 3.63 MIL/uL — ABNORMAL LOW (ref 4.22–5.81)
RDW: 15.9 % — ABNORMAL HIGH (ref 11.5–15.5)
WBC Count: 9.5 10*3/uL (ref 4.0–10.5)
nRBC: 0 % (ref 0.0–0.2)

## 2021-10-30 LAB — LACTATE DEHYDROGENASE: LDH: 132 U/L (ref 98–192)

## 2021-10-30 MED ORDER — SODIUM CHLORIDE 0.9 % IV SOLN: Freq: Once | INTRAVENOUS | Status: AC

## 2021-10-30 MED ORDER — FAMOTIDINE IN NACL 20-0.9 MG/50ML-% IV SOLN
20.0000 mg | Freq: Once | INTRAVENOUS | Status: AC
  Administered 2021-10-30: 20 mg via INTRAVENOUS
  Filled 2021-10-30: qty 50

## 2021-10-30 MED ORDER — AZITHROMYCIN 250 MG PO TABS: ORAL_TABLET | ORAL | 0 refills | Status: DC

## 2021-10-30 MED ORDER — HEPARIN SOD (PORK) LOCK FLUSH 100 UNIT/ML IV SOLN
500.0000 [IU] | Freq: Once | INTRAVENOUS | Status: AC | PRN
  Administered 2021-10-30: 500 [IU]

## 2021-10-30 MED ORDER — LORAZEPAM 1 MG PO TABS
0.5000 mg | ORAL_TABLET | Freq: Once | ORAL | Status: AC
  Administered 2021-10-30: 0.5 mg via ORAL
  Filled 2021-10-30: qty 1

## 2021-10-30 MED ORDER — SODIUM CHLORIDE 0.9% FLUSH
10.0000 mL | Freq: Once | INTRAVENOUS | Status: AC
  Administered 2021-10-30: 10 mL

## 2021-10-30 MED ORDER — METHYLPREDNISOLONE SODIUM SUCC 125 MG IJ SOLR
125.0000 mg | Freq: Every day | INTRAMUSCULAR | Status: DC
  Administered 2021-10-30: 125 mg via INTRAVENOUS
  Filled 2021-10-30: qty 2

## 2021-10-30 MED ORDER — SODIUM CHLORIDE 0.9 % IV SOLN
375.0000 mg/m2 | Freq: Once | INTRAVENOUS | Status: AC
  Administered 2021-10-30: 800 mg via INTRAVENOUS
  Filled 2021-10-30: qty 50

## 2021-10-30 MED ORDER — PEGFILGRASTIM-BMEZ 6 MG/0.6ML ~~LOC~~ SOSY
6.0000 mg | PREFILLED_SYRINGE | Freq: Once | SUBCUTANEOUS | Status: AC
  Administered 2021-10-30: 6 mg via SUBCUTANEOUS
  Filled 2021-10-30: qty 0.6

## 2021-10-30 MED ORDER — DIPHENHYDRAMINE HCL 25 MG PO CAPS
50.0000 mg | ORAL_CAPSULE | Freq: Once | ORAL | Status: AC
  Administered 2021-10-30: 50 mg via ORAL
  Filled 2021-10-30: qty 2

## 2021-10-30 MED ORDER — ACETAMINOPHEN 325 MG PO TABS
650.0000 mg | ORAL_TABLET | Freq: Once | ORAL | Status: AC
  Administered 2021-10-30: 650 mg via ORAL
  Filled 2021-10-30: qty 2

## 2021-10-30 MED ORDER — SODIUM CHLORIDE 0.9% FLUSH
10.0000 mL | INTRAVENOUS | Status: DC | PRN
  Administered 2021-10-30: 10 mL

## 2021-10-30 NOTE — Progress Notes (Signed)
Per Irene Limbo MD, ok to treat with HR 119. MD aware of upper respiratory symptoms (cough and phlem).  ?

## 2021-10-30 NOTE — Progress Notes (Signed)
Dr Irene Limbo is aware of pt's elevated HR and nasal congestion. Per Dr Irene Limbo pt is ok for tx today. MD will send in abx rx for s/s of congestion./  ?

## 2021-10-30 NOTE — Patient Instructions (Signed)
[facility] MEDICAL ONCOLOGY  Discharge Instructions: Thank you for choosing [facility] to provide your oncology and hematology care.   If you have a lab appointment with the Cancer Center, please go directly to the Cancer Center and check in at the registration area.   Wear comfortable clothing and clothing appropriate for easy access to any Portacath or PICC line.   We strive to give you quality time with your provider. You may need to reschedule your appointment if you arrive late (15 or more minutes).  Arriving late affects you and other patients whose appointments are after yours.  Also, if you miss three or more appointments without notifying the office, you may be dismissed from the clinic at the provider's discretion.      For prescription refill requests, have your pharmacy contact our office and allow 72 hours for refills to be completed.    Today you received the following chemotherapy and/or immunotherapy agents Rituximab      To help prevent nausea and vomiting after your treatment, we encourage you to take your nausea medication as directed.  BELOW ARE SYMPTOMS THAT SHOULD BE REPORTED IMMEDIATELY: *FEVER GREATER THAN 100.4 F (38 C) OR HIGHER *CHILLS OR SWEATING *NAUSEA AND VOMITING THAT IS NOT CONTROLLED WITH YOUR NAUSEA MEDICATION *UNUSUAL SHORTNESS OF BREATH *UNUSUAL BRUISING OR BLEEDING *URINARY PROBLEMS (pain or burning when urinating, or frequent urination) *BOWEL PROBLEMS (unusual diarrhea, constipation, pain near the anus) TENDERNESS IN MOUTH AND THROAT WITH OR WITHOUT PRESENCE OF ULCERS (sore throat, sores in mouth, or a toothache) UNUSUAL RASH, SWELLING OR PAIN  UNUSUAL VAGINAL DISCHARGE OR ITCHING   Items with * indicate a potential emergency and should be followed up as soon as possible or go to the Emergency Department if any problems should occur.  Please show the CHEMOTHERAPY ALERT CARD or IMMUNOTHERAPY ALERT CARD at check-in to  the Emergency Department and triage nurse.  Should you have questions after your visit or need to cancel or reschedule your appointment, please contact [facility]COL[facility]  Dept: [phone]  and follow the prompts.  Office hours are 8:00 a.m. to 4:30 p.m. Monday - Friday. Please note that voicemails left after 4:00 p.m. may not be returned until the following business day.  We are closed weekends and major holidays. You have access to a nurse at all times for urgent questions. Please call the main number to the clinic Dept: [phone] and follow the prompts.   For any non-urgent questions, you may also contact your provider using MyChart. We now offer e-Visits for anyone 18 and older to request care online for non-urgent symptoms. For details visit [email or web address].   Also download the MyChart app! Go to the app store, search "MyChart", open the app, select [facility], and log in with your MyChart username and password.  Due to Covid, a mask is required upon entering the hospital/clinic. If you do not have a mask, one will be given to you upon arrival. For doctor visits, patients may have 1 support person aged 18 or older with them. For treatment visits, patients cannot have anyone with them due to current Covid guidelines and our immunocompromised population.   

## 2021-11-02 ENCOUNTER — Other Ambulatory Visit: Payer: Self-pay

## 2021-11-02 DIAGNOSIS — C8338 Diffuse large B-cell lymphoma, lymph nodes of multiple sites: Secondary | ICD-10-CM

## 2021-11-04 ENCOUNTER — Other Ambulatory Visit: Payer: Self-pay | Admitting: Hematology

## 2021-11-04 DIAGNOSIS — C8338 Diffuse large B-cell lymphoma, lymph nodes of multiple sites: Secondary | ICD-10-CM

## 2021-11-06 ENCOUNTER — Other Ambulatory Visit: Payer: Self-pay | Admitting: Hematology

## 2021-11-06 ENCOUNTER — Encounter: Payer: Self-pay | Admitting: Hematology

## 2021-11-06 ENCOUNTER — Inpatient Hospital Stay: Payer: No Typology Code available for payment source

## 2021-11-06 ENCOUNTER — Other Ambulatory Visit: Payer: Self-pay

## 2021-11-06 ENCOUNTER — Inpatient Hospital Stay (HOSPITAL_BASED_OUTPATIENT_CLINIC_OR_DEPARTMENT_OTHER): Payer: No Typology Code available for payment source | Admitting: Hematology

## 2021-11-06 VITALS — BP 98/72 | HR 92 | Temp 97.8°F | Resp 16 | Ht 74.0 in | Wt 190.3 lb

## 2021-11-06 DIAGNOSIS — C8338 Diffuse large B-cell lymphoma, lymph nodes of multiple sites: Secondary | ICD-10-CM | POA: Diagnosis not present

## 2021-11-06 DIAGNOSIS — Z7189 Other specified counseling: Secondary | ICD-10-CM

## 2021-11-06 DIAGNOSIS — Z5112 Encounter for antineoplastic immunotherapy: Secondary | ICD-10-CM | POA: Diagnosis not present

## 2021-11-06 DIAGNOSIS — Z95828 Presence of other vascular implants and grafts: Secondary | ICD-10-CM

## 2021-11-06 LAB — CBC WITH DIFFERENTIAL (CANCER CENTER ONLY)
Abs Immature Granulocytes: 5.02 10*3/uL — ABNORMAL HIGH (ref 0.00–0.07)
Basophils Absolute: 0.1 10*3/uL (ref 0.0–0.1)
Basophils Relative: 0 %
Eosinophils Absolute: 0.1 10*3/uL (ref 0.0–0.5)
Eosinophils Relative: 1 %
HCT: 31.2 % — ABNORMAL LOW (ref 39.0–52.0)
Hemoglobin: 10.5 g/dL — ABNORMAL LOW (ref 13.0–17.0)
Immature Granulocytes: 20 %
Lymphocytes Relative: 9 %
Lymphs Abs: 2.3 10*3/uL (ref 0.7–4.0)
MCH: 31.1 pg (ref 26.0–34.0)
MCHC: 33.7 g/dL (ref 30.0–36.0)
MCV: 92.3 fL (ref 80.0–100.0)
Monocytes Absolute: 2.5 10*3/uL — ABNORMAL HIGH (ref 0.1–1.0)
Monocytes Relative: 10 %
Neutro Abs: 15.3 10*3/uL — ABNORMAL HIGH (ref 1.7–7.7)
Neutrophils Relative %: 60 %
Platelet Count: 199 10*3/uL (ref 150–400)
RBC: 3.38 MIL/uL — ABNORMAL LOW (ref 4.22–5.81)
RDW: 16.5 % — ABNORMAL HIGH (ref 11.5–15.5)
Smear Review: NORMAL
WBC Count: 25.3 10*3/uL — ABNORMAL HIGH (ref 4.0–10.5)
nRBC: 0.6 % — ABNORMAL HIGH (ref 0.0–0.2)

## 2021-11-06 LAB — CMP (CANCER CENTER ONLY)
ALT: 14 U/L (ref 0–44)
AST: 21 U/L (ref 15–41)
Albumin: 4 g/dL (ref 3.5–5.0)
Alkaline Phosphatase: 70 U/L (ref 38–126)
Anion gap: 6 (ref 5–15)
BUN: 14 mg/dL (ref 6–20)
CO2: 28 mmol/L (ref 22–32)
Calcium: 8.9 mg/dL (ref 8.9–10.3)
Chloride: 105 mmol/L (ref 98–111)
Creatinine: 0.92 mg/dL (ref 0.61–1.24)
GFR, Estimated: >60 mL/min (ref >60)
Glucose, Bld: 119 mg/dL — ABNORMAL HIGH (ref 70–99)
Potassium: 3.7 mmol/L (ref 3.5–5.1)
Sodium: 139 mmol/L (ref 135–145)
Total Bilirubin: 0.2 mg/dL — ABNORMAL LOW (ref 0.3–1.2)
Total Protein: 6.3 g/dL — ABNORMAL LOW (ref 6.5–8.1)

## 2021-11-06 LAB — LACTATE DEHYDROGENASE: LDH: 483 U/L — ABNORMAL HIGH (ref 98–192)

## 2021-11-06 MED ORDER — SODIUM CHLORIDE 0.9% FLUSH
10.0000 mL | Freq: Once | INTRAVENOUS | Status: AC
  Administered 2021-11-06: 10 mL

## 2021-11-06 MED ORDER — HEPARIN SOD (PORK) LOCK FLUSH 100 UNIT/ML IV SOLN
500.0000 [IU] | Freq: Once | INTRAVENOUS | Status: AC
  Administered 2021-11-06: 500 [IU]

## 2021-11-09 ENCOUNTER — Telehealth: Payer: Self-pay | Admitting: Hematology

## 2021-11-09 NOTE — Telephone Encounter (Signed)
Unable to leave message with follow-up appointments per 3/27 los. Patient is Mychart active. ?

## 2021-11-10 ENCOUNTER — Other Ambulatory Visit: Payer: Self-pay | Admitting: Hematology

## 2021-11-12 ENCOUNTER — Encounter: Payer: Self-pay | Admitting: Hematology

## 2021-11-12 NOTE — Progress Notes (Signed)
? ? ?HEMATOLOGY/ONCOLOGY CLINIC NOTE ? ?Date of Service: .11/06/2021 ? ? ?Patient Care Team: ?[name], MD as PCP - General (Family Medicine) ? ?CHIEF COMPLAINTS/PURPOSE OF CONSULTATION:  ? ?Up for continued evaluation and management of double expresser large B-cell lymphoma and toxicity check after cycle 4 of treatment ? ?HISTORY OF PRESENTING ILLNESS:  ? ?Please see previous notes for details on initial presentation ? ?INTERVAL HISTORY ? ?[name] for follow-up after his fifth cycle of chemoimmunotherapy and prior to his last planned cycle of chemoimmunotherapy for his large B-cell lymphoma.  He notes some progressive cumulative fatigue and some delay in recovery of his energy level compared to previously.  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  We discussed in detail and made a shared decision to discontinue and hold off on vincristine for his last cycle of treatment. ?He notes no other acute new symptoms.  No fevers no chills no night sweats.  No new lumps or bumps. ?We discussed in detail the pros and cons of intrathecal methotrexate for CNS prophylaxis and after initial consideration he called our office back and prefers not to proceed with this and this was discontinued. ?He is to be admitted on 11/13/2021 for his sixth and final cycle of EPOCH-R chemoimmunotherapy. ?He notes that he has not had any other infection issues since his last clinic visit. ? ? ?MEDICAL HISTORY:  ?Past Medical History:  ?Diagnosis Date  ? Allergy   ? Cancer [facility])   ? skin cancer  ? Herniated disc   ? Hx of skin cancer, basal cell   ? Hyperlipidemia   ? borderline- no meds   ? ? ?SURGICAL HISTORY: ?Past Surgical History:  ?Procedure Laterality Date  ? COLONOSCOPY    ? last 2013  ? ESOPHAGOGASTRODUODENOSCOPY (EGD) WITH PROPOFOL N/A 06/29/2021  ? Procedure: ESOPHAGOGASTRODUODENOSCOPY (EGD) WITH PROPOFOL;  Surgeon: Milus Banister, MD;   Location: [facility];  Service: Endoscopy;  Laterality: N/A;  ? EUS N/A 06/29/2021  ? Procedure: UPPER ENDOSCOPIC ULTRASOUND (EUS) RADIAL;  Surgeon: Milus Banister, MD;  Location: [facility];  Service: Endoscopy;  Laterality: N/A;  ? FINE NEEDLE ASPIRATION N/A 06/29/2021  ? Procedure: FINE NEEDLE ASPIRATION (FNA) LINEAR;  Surgeon: Milus Banister, MD;  Location: [facility];  Service: Endoscopy;  Laterality: N/A;  ? IR IMAGING GUIDED PORT INSERTION  07/26/2021  ? VASECTOMY    ? ? ?SOCIAL HISTORY: ?Social History  ? ?Socioeconomic History  ? Marital status: Married  ?  Spouse name: Not on file  ? Number of children: 1  ? Years of education: Not on file  ? Highest education level: Not on file  ?Occupational History  ? Occupation: Sales   ?Tobacco Use  ? Smoking status: Never  ? Smokeless tobacco: Never  ?Substance and Sexual Activity  ? Alcohol use: Not Currently  ?  Alcohol/week: 1.0 standard drink  ?  Types: 1 Glasses of wine per week  ?  Comment: Rare   ? Drug use: No  ? Sexual activity: Not on file  ?Other Topics Concern  ? Not on file  ?Social History Narrative  ? Daily caffeine   ? ?Social Determinants of Health  ? ?Financial Resource Strain: Not on file  ?Food Insecurity: Not on file  ?Transportation Needs: Not on file  ?Physical Activity: Not on file  ?Stress: Not on file  ?Social Connections: Not on file  ?Intimate Partner Violence:  Not on file  ? ? ?FAMILY HISTORY: ?Family History  ?Problem Relation Age of Onset  ? Colon cancer Mother 53  ? Colon polyps Mother   ? Colon cancer Paternal Grandfather   ? Alzheimer's disease Paternal Grandfather   ? Breast cancer Maternal Aunt   ? Diabetes Maternal Grandmother   ? Prostate cancer Maternal Grandfather   ? Esophageal cancer Neg Hx   ? Rectal cancer Neg Hx   ? Stomach cancer Neg Hx   ? ? ?ALLERGIES:  is allergic to bee pollen-1000-royal jelly [nutritional supplements], gluten meal, and lactose intolerance (gi). ? ?MEDICATIONS:  ?Current Outpatient  Medications  ?Medication Sig Dispense Refill  ? allopurinol (ZYLOPRIM) 100 MG tablet TAKE 1 TABLET BY MOUTH TWICE A DAY 60 tablet 0  ? Ascorbic Acid (VITAMIN C PO) Take 1 tablet by mouth 2 (two) times daily.    ? b complex vitamins capsule Take 1 capsule by mouth 2 (two) times a week.    ? Cholecalciferol (VITAMIN D-3 PO) Take 1 tablet by mouth 2 (two) times daily.    ? HYDROcodone-acetaminophen (NORCO) 5-325 MG tablet Take 1-2 tablets by mouth every 6 (six) hours as needed for moderate pain. 50 tablet 0  ? hydroxypropyl methylcellulose / hypromellose (ISOPTO TEARS / GONIOVISC) 2.5 % ophthalmic solution Place 1 drop into both eyes 4 (four) times daily as needed for dry eyes. 15 mL 12  ? ibuprofen (ADVIL) 200 MG tablet Take 400 mg by mouth every 6 (six) hours as needed (pain).    ? lidocaine-prilocaine (EMLA) cream Apply to affected area once (Patient taking differently: Apply 1 application. topically daily as needed (prior to port access).) 30 g 3  ? loratadine (CLARITIN) 10 MG tablet Take 10 mg by mouth daily as needed (seasonal allergies).    ? LORazepam (ATIVAN) 0.5 MG tablet Take 1 tablet (0.5 mg total) by mouth every 6 (six) hours as needed (Nausea or vomiting). 30 tablet 0  ? magic mouthwash w/lidocaine SOLN Take 5 mLs by mouth 4 (four) times daily as needed for mouth pain. Rx: ?50 mL viscous lidocaine 2% ?50 mL Maalox ?50 mL diphenhydramine (Benadryl) at 12.5 mg per 5 ml elixir ?50 mL nystatin (100,000U per 5 mL) suspension ?50 mL distilled water (Patient not taking: Reported on 10/02/2021) 250 mL 1  ? ondansetron (ZOFRAN) 8 MG tablet Take 1 tablet (8 mg total) by mouth 2 (two) times daily as needed for refractory nausea / vomiting. Start on day 3 after cyclophosphamide chemotherapy. (Patient not taking: Reported on 09/12/2021) 30 tablet 1  ? pantoprazole (PROTONIX) 40 MG tablet Take 1 tablet (40 mg total) by mouth daily before breakfast. (Patient taking differently: Take 40 mg by mouth daily as needed  (heartburn).) 30 tablet 2  ? polyethylene glycol (MIRALAX / GLYCOLAX) 17 g packet Take 17 g by mouth daily as needed (constipation).    ? prochlorperazine (COMPAZINE) 10 MG tablet Take 1 tablet (10 mg total) by mouth every 6 (six) hours as needed (Nausea or vomiting). 30 tablet 6  ? senna-docusate (SENOKOT-S) 8.6-50 MG tablet Take 1 tablet by mouth at bedtime as needed for mild constipation.    ? Sodium Chloride-Sodium Bicarb (SODIUM BICARBONATE/SODIUM CHLORIDE) SOLN 1 application by Mouth Rinse route as needed for dry mouth. (Patient taking differently: 1 application. by Mouth Rinse route 2 (two) times daily as needed for dry mouth.)    ? ?No current facility-administered medications for this visit.  ? ? ?REVIEW OF SYSTEMS:   ?10 Point  review of Systems was done is negative except as noted above. ? ?PHYSICAL EXAMINATION: ?ECOG PERFORMANCE STATUS: 1 - Symptomatic but completely ambulatory ? ?Vitals:  ? 11/06/21 1458  ?BP: 98/72  ?Pulse: 92  ?Resp: 16  ?Temp: 97.8 ?F (36.6 ?C)  ?SpO2: 100%  ? ?Filed Weights  ? 11/06/21 1458  ?Weight: 190 lb 4.8 oz (86.3 kg)  ? ?.Body mass index is 24.43 kg/m?[name] ?NAD ?GENERAL:alert, in no acute distress and comfortable ?SKIN: no acute rashes, no significant lesions ?EYES: conjunctiva are pink and non-injected, sclera anicteric ?OROPHARYNX: MMM, no exudates, no oropharyngeal erythema or ulceration ?NECK: supple, no JVD ?LYMPH:  no palpable lymphadenopathy in the cervical, axillary or inguinal regions ?LUNGS: clear to auscultation b/l with normal respiratory effort ?HEART: regular rate & rhythm ?ABDOMEN:  normoactive bowel sounds , non tender, not distended. ?Extremity: no pedal edema ?PSYCH: alert & oriented x 3 with fluent speech ?NEURO: no focal motor/sensory deficits ? ? ?LABORATORY DATA:  ? ?I have reviewed the data as listed ? ? ?  Latest Ref Rng & Units 11/06/2021  ?  2:28 PM 10/30/2021  ?  9:36 AM 10/27/2021  ?  6:08 AM  ?CBC  ?WBC 4.0 - 10.5 K/uL 25.3   9.5   8.6    ?Hemoglobin  13.0 - 17.0 g/dL 10.5   11.0   10.5    ?Hematocrit 39.0 - 52.0 % 31.2   32.0   31.3    ?Platelets 150 - 400 K/uL 199   267   201    ? ? ?. ? ?  Latest Ref Rng & Units 11/06/2021  ?  2:28 PM 10/30/2021  ?  9:36 AM 10/27/2021  ?  6

## 2021-11-13 ENCOUNTER — Encounter (HOSPITAL_COMMUNITY): Payer: Self-pay | Admitting: Hematology

## 2021-11-13 ENCOUNTER — Inpatient Hospital Stay (HOSPITAL_COMMUNITY)
Admission: AD | Admit: 2021-11-13 | Discharge: 2021-11-17 | DRG: 847 | Disposition: A | Discharge status: Home / Self Care | Payer: No Typology Code available for payment source | Attending: Hematology | Admitting: Hematology

## 2021-11-13 DIAGNOSIS — Z8042 Family history of malignant neoplasm of prostate: Secondary | ICD-10-CM | POA: Diagnosis not present

## 2021-11-13 DIAGNOSIS — Z820 Family history of epilepsy and other diseases of the nervous system: Secondary | ICD-10-CM

## 2021-11-13 DIAGNOSIS — Z5111 Encounter for antineoplastic chemotherapy: Principal | ICD-10-CM

## 2021-11-13 DIAGNOSIS — D72828 Other elevated white blood cell count: Secondary | ICD-10-CM | POA: Diagnosis present

## 2021-11-13 DIAGNOSIS — C8338 Diffuse large B-cell lymphoma, lymph nodes of multiple sites: Secondary | ICD-10-CM | POA: Diagnosis present

## 2021-11-13 DIAGNOSIS — Z800 Family history of malignant neoplasm of digestive organs: Secondary | ICD-10-CM

## 2021-11-13 DIAGNOSIS — E739 Lactose intolerance, unspecified: Secondary | ICD-10-CM | POA: Diagnosis present

## 2021-11-13 DIAGNOSIS — Z7189 Other specified counseling: Secondary | ICD-10-CM

## 2021-11-13 DIAGNOSIS — Z8371 Family history of colonic polyps: Secondary | ICD-10-CM

## 2021-11-13 DIAGNOSIS — Z79899 Other long term (current) drug therapy: Secondary | ICD-10-CM | POA: Diagnosis not present

## 2021-11-13 DIAGNOSIS — Z833 Family history of diabetes mellitus: Secondary | ICD-10-CM | POA: Diagnosis not present

## 2021-11-13 DIAGNOSIS — Z803 Family history of malignant neoplasm of breast: Secondary | ICD-10-CM

## 2021-11-13 LAB — CBC WITH DIFFERENTIAL/PLATELET
Abs Immature Granulocytes: 0.1 10*3/uL — ABNORMAL HIGH (ref 0.00–0.07)
Basophils Absolute: 0.1 10*3/uL (ref 0.0–0.1)
Basophils Relative: 1 %
Eosinophils Absolute: 0 10*3/uL (ref 0.0–0.5)
Eosinophils Relative: 0 %
HCT: 34.2 % — ABNORMAL LOW (ref 39.0–52.0)
Hemoglobin: 11.7 g/dL — ABNORMAL LOW (ref 13.0–17.0)
Immature Granulocytes: 1 %
Lymphocytes Relative: 15 %
Lymphs Abs: 1.7 10*3/uL (ref 0.7–4.0)
MCH: 31.7 pg (ref 26.0–34.0)
MCHC: 34.2 g/dL (ref 30.0–36.0)
MCV: 92.7 fL (ref 80.0–100.0)
Monocytes Absolute: 0.9 10*3/uL (ref 0.1–1.0)
Monocytes Relative: 8 %
Neutro Abs: 8.9 10*3/uL — ABNORMAL HIGH (ref 1.7–7.7)
Neutrophils Relative %: 75 %
Platelets: 222 10*3/uL (ref 150–400)
RBC: 3.69 MIL/uL — ABNORMAL LOW (ref 4.22–5.81)
RDW: 15.5 % (ref 11.5–15.5)
WBC: 11.8 10*3/uL — ABNORMAL HIGH (ref 4.0–10.5)
nRBC: 0 % (ref 0.0–0.2)

## 2021-11-13 LAB — COMPREHENSIVE METABOLIC PANEL
ALT: 15 U/L (ref 0–44)
AST: 16 U/L (ref 15–41)
Albumin: 4 g/dL (ref 3.5–5.0)
Alkaline Phosphatase: 54 U/L (ref 38–126)
Anion gap: 6 (ref 5–15)
BUN: 15 mg/dL (ref 6–20)
CO2: 26 mmol/L (ref 22–32)
Calcium: 9.1 mg/dL (ref 8.9–10.3)
Chloride: 103 mmol/L (ref 98–111)
Creatinine, Ser: 0.73 mg/dL (ref 0.61–1.24)
GFR, Estimated: >60 mL/min (ref >60)
Glucose, Bld: 125 mg/dL — ABNORMAL HIGH (ref 70–99)
Potassium: 3.7 mmol/L (ref 3.5–5.1)
Sodium: 135 mmol/L (ref 135–145)
Total Bilirubin: 0.4 mg/dL (ref 0.3–1.2)
Total Protein: 6.5 g/dL (ref 6.5–8.1)

## 2021-11-13 LAB — LACTATE DEHYDROGENASE: LDH: 172 U/L (ref 98–192)

## 2021-11-13 MED ORDER — HOT PACK MISC ONCOLOGY
1.0000 | Freq: Once | Status: DC | PRN
  Filled 2021-11-13: qty 1

## 2021-11-13 MED ORDER — POLYETHYLENE GLYCOL 3350 17 G PO PACK
17.0000 g | PACK | Freq: Every day | ORAL | Status: DC
  Filled 2021-11-13: qty 1

## 2021-11-13 MED ORDER — IBUPROFEN 200 MG PO TABS: 400.0000 mg | ORAL_TABLET | Freq: Four times a day (QID) | ORAL | Status: DC | PRN

## 2021-11-13 MED ORDER — SODIUM CHLORIDE 0.9 % IV SOLN
Freq: Once | INTRAVENOUS | Status: AC
  Administered 2021-11-13: 18 mg via INTRAVENOUS
  Filled 2021-11-13: qty 4

## 2021-11-13 MED ORDER — PREDNISONE 20 MG PO TABS
60.0000 mg | ORAL_TABLET | Freq: Every day | ORAL | Status: AC
  Administered 2021-11-13 – 2021-11-17 (×5): 60 mg via ORAL
  Filled 2021-11-13 (×5): qty 3

## 2021-11-13 MED ORDER — DIPHENHYDRAMINE HCL 25 MG PO CAPS
25.0000 mg | ORAL_CAPSULE | Freq: Four times a day (QID) | ORAL | Status: DC | PRN
  Administered 2021-11-13: 25 mg via ORAL
  Filled 2021-11-13: qty 1

## 2021-11-13 MED ORDER — LORATADINE 10 MG PO TABS: 10.0000 mg | ORAL_TABLET | Freq: Every day | ORAL | Status: DC | PRN

## 2021-11-13 MED ORDER — SENNOSIDES-DOCUSATE SODIUM 8.6-50 MG PO TABS: 1.0000 | ORAL_TABLET | Freq: Every evening | ORAL | Status: DC | PRN

## 2021-11-13 MED ORDER — LIDOCAINE-PRILOCAINE 2.5-2.5 % EX CREA: 1.0000 "application " | TOPICAL_CREAM | Freq: Every day | CUTANEOUS | Status: DC | PRN

## 2021-11-13 MED ORDER — COLD PACK MISC ONCOLOGY
1.0000 | Freq: Once | Status: DC | PRN
  Filled 2021-11-13: qty 1

## 2021-11-13 MED ORDER — SODIUM CHLORIDE 0.9% FLUSH
10.0000 mL | Freq: Two times a day (BID) | INTRAVENOUS | Status: DC
  Administered 2021-11-13 – 2021-11-17 (×7): 10 mL

## 2021-11-13 MED ORDER — LORAZEPAM 0.5 MG PO TABS
0.5000 mg | ORAL_TABLET | Freq: Four times a day (QID) | ORAL | Status: DC | PRN
  Administered 2021-11-13 – 2021-11-17 (×11): 0.5 mg via ORAL
  Filled 2021-11-13 (×11): qty 1

## 2021-11-13 MED ORDER — ACETAMINOPHEN 325 MG PO TABS: 650.0000 mg | ORAL_TABLET | ORAL | Status: DC | PRN

## 2021-11-13 MED ORDER — POLYETHYLENE GLYCOL 3350 17 G PO PACK: 17.0000 g | PACK | Freq: Every day | ORAL | Status: DC | PRN

## 2021-11-13 MED ORDER — PROCHLORPERAZINE MALEATE 10 MG PO TABS: 10.0000 mg | ORAL_TABLET | Freq: Four times a day (QID) | ORAL | Status: DC | PRN

## 2021-11-13 MED ORDER — PANTOPRAZOLE SODIUM 40 MG PO TBEC
40.0000 mg | DELAYED_RELEASE_TABLET | Freq: Every day | ORAL | Status: DC
  Administered 2021-11-14 – 2021-11-17 (×4): 40 mg via ORAL
  Filled 2021-11-13 (×5): qty 1

## 2021-11-13 MED ORDER — SODIUM BICARBONATE/SODIUM CHLORIDE MOUTHWASH
1.0000 "application " | OROMUCOSAL | Status: DC | PRN
  Filled 2021-11-13: qty 1000

## 2021-11-13 MED ORDER — CHLORHEXIDINE GLUCONATE CLOTH 2 % EX PADS
6.0000 | MEDICATED_PAD | Freq: Every day | CUTANEOUS | Status: DC
  Administered 2021-11-13 – 2021-11-16 (×4): 6 via TOPICAL

## 2021-11-13 MED ORDER — POLYVINYL ALCOHOL 1.4 % OP SOLN: 1.0000 [drp] | Freq: Four times a day (QID) | OPHTHALMIC | Status: DC | PRN

## 2021-11-13 MED ORDER — MAGIC MOUTHWASH W/LIDOCAINE
5.0000 mL | Freq: Four times a day (QID) | ORAL | Status: DC | PRN
  Filled 2021-11-13: qty 5

## 2021-11-13 MED ORDER — SODIUM CHLORIDE 0.9% FLUSH
10.0000 mL | INTRAVENOUS | Status: DC | PRN
  Administered 2021-11-13: 10 mL

## 2021-11-13 MED ORDER — SODIUM CHLORIDE 0.9 % IV SOLN
Freq: Once | INTRAVENOUS | Status: AC
  Filled 2021-11-13: qty 10

## 2021-11-13 MED ORDER — HYDROCODONE-ACETAMINOPHEN 5-325 MG PO TABS: 1.0000 | ORAL_TABLET | Freq: Four times a day (QID) | ORAL | Status: DC | PRN

## 2021-11-13 MED ORDER — SODIUM CHLORIDE 0.9 % IV SOLN: INTRAVENOUS | Status: DC

## 2021-11-13 NOTE — H&P (Addendum)
[place]  ?[phone] [phone]  ? ?MEDICAL ONCOLOGY - ADMISSION H&P ? ?Reason for Admission: Diffuse large B-cell lymphoma, chemotherapy administration ? ?HPI: [name] is a 41 year old male with diffuse large B-cell lymphoma.  He initially received 2 cycles of R-CHOP and has tolerated well.  Although patient does not have double hit lymphoma, recommendation was made to switch to [facility] beginning with cycle #3.  He received his first cycle of  EPOCH-R beginning on 09/11/2021.  He tolerated this very well.  He had a PET scan performed following his third cycle which showed a significant response to treatment. ? ?The patient reports that he is feeling well today.  He reports a intermittent nonproductive cough which he attributes to allergies.  His blurred vision has resolved.  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  He is not having any fevers, chills, mucositis, chest pain, shortness of breath, abdominal pain, nausea, vomiting, constipation, diarrhea.  The patient presents to the hospital today for admission for cycle #6 of his chemotherapy. ? ?Past Medical History:  ?Diagnosis Date  ? Allergy   ? Cancer [facility])   ? skin cancer  ? Herniated disc   ? Hx of skin cancer, basal cell   ? Hyperlipidemia   ? borderline- no meds   ?: ? ? ?Past Surgical History:  ?Procedure Laterality Date  ? COLONOSCOPY    ? last 2013  ? ESOPHAGOGASTRODUODENOSCOPY (EGD) WITH PROPOFOL N/A 06/29/2021  ? Procedure: ESOPHAGOGASTRODUODENOSCOPY (EGD) WITH PROPOFOL;  Surgeon: Milus Banister, MD;  Location: [facility];  Service: Endoscopy;  Laterality: N/A;  ? EUS N/A 06/29/2021  ? Procedure: UPPER ENDOSCOPIC ULTRASOUND (EUS) RADIAL;  Surgeon: Milus Banister, MD;  Location: [facility];  Service: Endoscopy;  Laterality: N/A;  ? FINE NEEDLE ASPIRATION N/A 06/29/2021  ? Procedure: FINE NEEDLE ASPIRATION  (FNA) LINEAR;  Surgeon: Milus Banister, MD;  Location: [facility];  Service: Endoscopy;  Laterality: N/A;  ? IR IMAGING GUIDED PORT INSERTION  07/26/2021  ? VASECTOMY    ?: ? ? ?Current Facility-Administered Medications  ?Medication Dose Route Frequency Provider Last Rate Last Admin  ? acetaminophen (TYLENOL) tablet 650 mg  650 mg Oral Q4H PRN Maryanna Shape, NP      ? Chlorhexidine Gluconate Cloth 2 % PADS 6 each  6 each Topical Daily Curcio, Kristin R, NP      ? HYDROcodone-acetaminophen (NORCO/VICODIN) 5-325 MG per tablet 1-2 tablet  1-2 tablet Oral Q6H PRN Maryanna Shape, NP      ? ibuprofen (ADVIL) tablet 400 mg  400 mg Oral Q6H PRN Maryanna Shape, NP      ? lidocaine-prilocaine (EMLA) cream 1 application.  1 application. Topical Daily PRN Maryanna Shape, NP      ? loratadine (CLARITIN) tablet 10 mg  10 mg Oral Daily PRN Maryanna Shape, NP      ? LORazepam (ATIVAN) tablet 0.5 mg  0.5 mg Oral Q6H PRN Maryanna Shape, NP      ? magic mouthwash w/lidocaine  5 mL Oral QID PRN Maryanna Shape, NP      ? [START ON 11/14/2021] pantoprazole (PROTONIX) EC tablet 40 mg  40 mg Oral QAC breakfast Curcio, Kristin R, NP      ? polyethylene glycol (MIRALAX / GLYCOLAX) packet 17 g  17 g Oral Daily PRN Maryanna Shape, NP      ?  polyvinyl alcohol (LIQUIFILM TEARS) 1.4 % ophthalmic solution 1 drop  1 drop Both Eyes QID PRN Maryanna Shape, NP      ? prochlorperazine (COMPAZINE) tablet 10 mg  10 mg Oral Q6H PRN Curcio, Roselie Awkward, NP      ? senna-docusate (Senokot-S) tablet 1 tablet  1 tablet Oral QHS PRN Maryanna Shape, NP      ? sodium bicarbonate/sodium chloride mouthwash [id number]  1 application. Mouth Rinse PRN Mikey Bussing R, NP      ? sodium chloride flush (NS) 0.9 % injection 10-40 mL  10-40 mL Intracatheter Q12H Curcio, Kristin R, NP   10 mL at 11/13/21 1215  ? sodium chloride flush (NS) 0.9 % injection 10-40 mL  10-40 mL Intracatheter PRN Maryanna Shape, NP      ? ? ? ? ?Allergies   ?Allergen Reactions  ? Bee Pollen-1000-Royal Jelly [Nutritional Supplements] Anaphylaxis  ?  And Bee's Wax - inflammatory reaction per pt   ? Gluten Meal Other (See Comments)  ?  Abdominal cramping   ? Lactose Intolerance (Gi) Other (See Comments)  ?  Abdominal cramping  ?: ? ? ?Family History  ?Problem Relation Age of Onset  ? Colon cancer Mother 11  ? Colon polyps Mother   ? Colon cancer Paternal Grandfather   ? Alzheimer's disease Paternal Grandfather   ? Breast cancer Maternal Aunt   ? Diabetes Maternal Grandmother   ? Prostate cancer Maternal Grandfather   ? Esophageal cancer Neg Hx   ? Rectal cancer Neg Hx   ? Stomach cancer Neg Hx   ?: ? ? ?Social History  ? ?Socioeconomic History  ? Marital status: Married  ?  Spouse name: Not on file  ? Number of children: 1  ? Years of education: Not on file  ? Highest education level: Not on file  ?Occupational History  ? Occupation: Sales   ?Tobacco Use  ? Smoking status: Never  ? Smokeless tobacco: Never  ?Substance and Sexual Activity  ? Alcohol use: Not Currently  ?  Alcohol/week: 1.0 standard drink  ?  Types: 1 Glasses of wine per week  ?  Comment: Rare   ? Drug use: No  ? Sexual activity: Not on file  ?Other Topics Concern  ? Not on file  ?Social History Narrative  ? Daily caffeine   ? ?Social Determinants of Health  ? ?Financial Resource Strain: Not on file  ?Food Insecurity: Not on file  ?Transportation Needs: Not on file  ?Physical Activity: Not on file  ?Stress: Not on file  ?Social Connections: Not on file  ?Intimate Partner Violence: Not on file  ?: ? ?Review of Systems: A comprehensive 14 point review of systems was negative except as noted in the HPI. ? ?Exam: ?Patient Vitals for the past 24 hrs: ? BP Temp Temp src Pulse Resp SpO2 Height Weight  ?11/13/21 1049 104/78 98 ?F (36.7 ?C) Oral 98 16 100 % '6\' 2"'$  (1.88 m) 86.2 kg  ? ? ? ?General:  well-nourished in no acute distress.   ?Eyes:  no scleral icterus.   ?ENT:  There were no oropharyngeal lesions.    ?Lymphatics:  Negative cervical, supraclavicular or axillary adenopathy.   ?Respiratory: lungs were clear bilaterally without wheezing or crackles.   ?Cardiovascular:  Regular rate and rhythm, S1/S2, without murmur, rub or gallop.  There was no pedal edema.   ?GI:  abdomen was soft, flat, nontender, nondistended, without organomegaly.   ?Skin exam was without  echymosis, petichae.   ?Neuro exam was nonfocal. Patient was alert and oriented.  Attention was good.   Language was appropriate.  Mood was normal without depression.  Speech was not pressured.  Thought content was not tangential.   ? ? ?Lab Results  ?Component Value Date  ? WBC 11.8 (H) 11/13/2021  ? HGB 11.7 (L) 11/13/2021  ? HCT 34.2 (L) 11/13/2021  ? PLT 222 11/13/2021  ? GLUCOSE 125 (H) 11/13/2021  ? ALT 15 11/13/2021  ? AST 16 11/13/2021  ? NA 135 11/13/2021  ? K 3.7 11/13/2021  ? CL 103 11/13/2021  ? CREATININE 0.73 11/13/2021  ? BUN 15 11/13/2021  ? CO2 26 11/13/2021  ? ? ?No results found. ? ? ?No results found. ? ?Assessment and Plan:  ? ?Very pleasant 41 year old gentleman with some chronic food related bowel irregularities with ?  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 ?Lymphoma FISH panel shows no evidence of c-Myc rearrangement ruling out double hit or triple hit lymphoma. ?Patient presented with extensive abdominal and retroperitoneal lymphadenopathy, right supraclavicular lymphadenopathy and lung nodules concerning for lymphoma involvement as well as concern for small bowel involvement. ?-PET scan 09/21/2021- "1. Marked interval reduction in size and activity of prior adenopathy in the and abdomen. Much of the prior adenopathy has completely resolved (Deauville 1), with some small remaining lymph nodes demonstrating Deauville 2 levels of activity. The previous abnormal hypermetabolic small bowel wall thickening in the pelvis has completely resolved. 2. Diffuse accentuated metabolic activity in the skeleton, probably attributable  to granulocyte stimulation. 3. 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 Prominent stool throughout the colon favors con

## 2021-11-14 ENCOUNTER — Other Ambulatory Visit: Payer: Self-pay

## 2021-11-14 ENCOUNTER — Encounter: Payer: Self-pay | Admitting: Hematology

## 2021-11-14 DIAGNOSIS — C8338 Diffuse large B-cell lymphoma, lymph nodes of multiple sites: Secondary | ICD-10-CM

## 2021-11-14 DIAGNOSIS — Z5111 Encounter for antineoplastic chemotherapy: Secondary | ICD-10-CM | POA: Diagnosis not present

## 2021-11-14 LAB — CBC WITH DIFFERENTIAL/PLATELET
Abs Immature Granulocytes: 0.15 10*3/uL — ABNORMAL HIGH (ref 0.00–0.07)
Basophils Absolute: 0 10*3/uL (ref 0.0–0.1)
Basophils Relative: 0 %
Eosinophils Absolute: 0 10*3/uL (ref 0.0–0.5)
Eosinophils Relative: 0 %
HCT: 34.8 % — ABNORMAL LOW (ref 39.0–52.0)
Hemoglobin: 11.4 g/dL — ABNORMAL LOW (ref 13.0–17.0)
Immature Granulocytes: 1 %
Lymphocytes Relative: 6 %
Lymphs Abs: 0.8 10*3/uL (ref 0.7–4.0)
MCH: 30.2 pg (ref 26.0–34.0)
MCHC: 32.8 g/dL (ref 30.0–36.0)
MCV: 92.3 fL (ref 80.0–100.0)
Monocytes Absolute: 0.3 10*3/uL (ref 0.1–1.0)
Monocytes Relative: 2 %
Neutro Abs: 13.5 10*3/uL — ABNORMAL HIGH (ref 1.7–7.7)
Neutrophils Relative %: 91 %
Platelets: 221 10*3/uL (ref 150–400)
RBC: 3.77 MIL/uL — ABNORMAL LOW (ref 4.22–5.81)
RDW: 15.4 % (ref 11.5–15.5)
WBC: 14.8 10*3/uL — ABNORMAL HIGH (ref 4.0–10.5)
nRBC: 0 % (ref 0.0–0.2)

## 2021-11-14 LAB — COMPREHENSIVE METABOLIC PANEL
ALT: 16 U/L (ref 0–44)
AST: 18 U/L (ref 15–41)
Albumin: 4 g/dL (ref 3.5–5.0)
Alkaline Phosphatase: 51 U/L (ref 38–126)
Anion gap: 6 (ref 5–15)
BUN: 15 mg/dL (ref 6–20)
CO2: 23 mmol/L (ref 22–32)
Calcium: 9.5 mg/dL (ref 8.9–10.3)
Chloride: 110 mmol/L (ref 98–111)
Creatinine, Ser: 0.76 mg/dL (ref 0.61–1.24)
GFR, Estimated: >60 mL/min (ref >60)
Glucose, Bld: 132 mg/dL — ABNORMAL HIGH (ref 70–99)
Potassium: 4.2 mmol/L (ref 3.5–5.1)
Sodium: 139 mmol/L (ref 135–145)
Total Bilirubin: 0.4 mg/dL (ref 0.3–1.2)
Total Protein: 6.2 g/dL — ABNORMAL LOW (ref 6.5–8.1)

## 2021-11-14 MED ORDER — SODIUM CHLORIDE 0.9 % IV SOLN
Freq: Once | INTRAVENOUS | Status: AC
  Administered 2021-11-14: 18 mg via INTRAVENOUS
  Filled 2021-11-14: qty 4

## 2021-11-14 MED ORDER — SODIUM CHLORIDE 0.9 % IV SOLN
Freq: Once | INTRAVENOUS | Status: AC
  Filled 2021-11-14: qty 10

## 2021-11-14 NOTE — Progress Notes (Addendum)
HEMATOLOGY-ONCOLOGY PROGRESS NOTE ? ?ASSESSMENT AND PLAN: ?Very pleasant 41 year old gentleman with some chronic food related bowel irregularities with ?  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 ?Lymphoma FISH panel shows no evidence of c-Myc rearrangement ruling out double hit or triple hit lymphoma. ?Patient presented with extensive abdominal and retroperitoneal lymphadenopathy, right supraclavicular lymphadenopathy and lung nodules concerning for lymphoma involvement as well as concern for small bowel involvement. ?-PET scan 09/21/2021- "1. Marked interval reduction in size and activity of prior adenopathy in the and abdomen. Much of the prior adenopathy has completely resolved (Deauville 1), with some small remaining lymph nodes demonstrating Deauville 2 levels of activity. The previous abnormal hypermetabolic small bowel wall thickening in the pelvis has completely resolved. 2. Diffuse accentuated metabolic activity in the skeleton, probably attributable to granulocyte stimulation. 3. 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 Prominent stool throughout the colon favors constipation." ?2) pulmonary nodules concerning for lymphoma involvement ?3) 4.4 cm small bowel thickening concerning for involvement by lymphoma.  Cannot rule out adenocarcinoma but less likely. ?4) acute kidney injury.  Patient's creatinine had bumped to 1.6 but on repeat is back down to normal. ?5) Food intolerances including gluten and dairy avoidance. ?  ?PLAN ?-Labs from this morning have been reviewed.  His WBC is elevated likely due to prednisone.  Hemoglobin stable.  Platelets normal.  Glucose elevated at 13.  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 Vincristine was held with a cycle of chemotherapy. Labs are adequate to proceed with day 2 of chemotherapy as planned.  We will continue to check a daily  CBC with differential and CMET. ?-Low risk for DVT.  Holding DVT prophylaxis per patient request.  He is ambulating on a regular basis. ?-Continue home medications and Protonix 40 mg daily. ?-Continue daily MiraLAX and has Senokot-S available to him as needed for constipation. ?-Continue as needed Claritin and Benadryl for allergies. ?-Rituxan and Ellen Henri are scheduled already at the cancer center on day 8 and he has a follow-up visit on 11/27/2021. ? ?Mikey Bussing, DNP, AGPCNP-BC, AOCNP ? ?SUBJECTIVE: Feels well overall this morning.  Continues to have a nonproductive cough.  Afebrile.  Continues to have some mild peripheral neuropathy in his fingertips which is transient.  He is not having any nausea or vomiting.   ?Oncology History  ?Diffuse large B-cell lymphoma of lymph nodes of multiple regions [facility])  ?07/21/2021 Initial Diagnosis  ? Diffuse large B-cell lymphoma of lymph nodes of multiple regions [facility]) ?  ?07/27/2021 - 08/19/2021 Chemotherapy  ? Patient is on Treatment Plan : NON-HODGKINS LYMPHOMA R-CHOP q21d  ?   ?09/04/2021 Cancer Staging  ? Staging form: Hodgkin and Non-Hodgkin Lymphoma, AJCC 8th Edition ?- Clinical: Stage IV (Diffuse large B-cell lymphoma) - Signed by Brunetta Genera, MD on 09/04/2021 ?Stage prefix: Initial diagnosis ?  ?09/11/2021 -  Chemotherapy  ? Patient is on Treatment Plan : IP NON-HODGKINS LYMPHOMA EPOCH q21d  ?   ?09/18/2021 -  Chemotherapy  ? Patient is on Treatment Plan : NON-HODGKINS LYMPHOMA Rituximab q21d  ?   ? ? ? ?REVIEW OF SYSTEMS:   ?Review of Systems  ?Constitutional: Negative.   ?HENT: Negative.    ?Eyes: Negative.   ?Respiratory: Negative.    ?Cardiovascular: Negative.   ?Gastrointestinal: Negative.  Negative for nausea and vomiting.  ?Genitourinary: Negative.   ?Musculoskeletal: Negative.   ?Skin: Negative.   ?Neurological:   ?  Has persistent grade 2 peripheral neuropathy to his fingertips  ?Psychiatric/Behavioral: Negative.    ? ?I have reviewed the past medical  history, past surgical history, social history and family history with the patient and they are unchanged from previous note. ? ? ?PHYSICAL EXAMINATION: ?ECOG PERFORMANCE STATUS: 1 - Symptomatic but completely ambulatory ? ?Vitals:  ? 11/13/21 2233 11/14/21 0448  ?BP: 101/67 96/65  ?Pulse: (!) 106 93  ?Resp: 18 16  ?Temp: 97.8 ?F (36.6 ?C) (!) 97.5 ?F (36.4 ?C)  ?SpO2: 96% 96%  ? ?Filed Weights  ? 11/13/21 1049  ?Weight: 86.2 kg  ? ? ?Intake/Output from previous day: ?04/03 0701 - 04/04 0700 ?In: 480 [P.O.:480] ?Out: -  ? ?Physical Exam ?Vitals reviewed.  ?Constitutional:   ?   General: He is not in acute distress. ?HENT:  ?   Head: Normocephalic.  ?   Mouth/Throat:  ?   Mouth: Mucous membranes are moist.  ?   Pharynx: No oropharyngeal exudate.  ?Eyes:  ?   General: No scleral icterus. ?   Conjunctiva/sclera: Conjunctivae normal.  ?Cardiovascular:  ?   Rate and Rhythm: Normal rate and regular rhythm.  ?Pulmonary:  ?   Effort: Pulmonary effort is normal.  ?   Breath sounds: Normal breath sounds.  ?Abdominal:  ?   General: Bowel sounds are normal.  ?   Palpations: Abdomen is soft.  ?   Tenderness: There is no abdominal tenderness.  ?Musculoskeletal:  ?   Right lower leg: No edema.  ?   Left lower leg: No edema.  ?Skin: ?   General: Skin is warm and dry.  ?Neurological:  ?   Mental Status: He is alert and oriented to person, place, and time.  ?Psychiatric:     ?   Mood and Affect: Mood normal.     ?   Behavior: Behavior normal.     ?   Thought Content: Thought content normal.     ?   Judgment: Judgment normal.  ? ? ?LABORATORY DATA:  ?I have reviewed the data as listed ? ?  Latest Ref Rng & Units 11/14/2021  ?  4:29 AM 11/13/2021  ? 12:02 PM 11/06/2021  ?  2:28 PM  ?CMP  ?Glucose 70 - 99 mg/dL 132   125   119    ?BUN 6 - 20 mg/dL '15   15   14    '$ ?Creatinine 0.61 - 1.24 mg/dL 0.76   0.73   0.92    ?Sodium 135 - 145 mmol/L 139   135   139    ?Potassium 3.5 - 5.1 mmol/L 4.2   3.7   3.7    ?Chloride 98 - 111 mmol/L 110   103    105    ?CO2 22 - 32 mmol/L '23   26   28    '$ ?Calcium 8.9 - 10.3 mg/dL 9.5   9.1   8.9    ?Total Protein 6.5 - 8.1 g/dL 6.2   6.5   6.3    ?Total Bilirubin 0.3 - 1.2 mg/dL 0.4   0.4   0.2    ?Alkaline Phos 38 - 126 U/L 51   54   70    ?AST 15 - 41 U/L '18   16   21    '$ ?ALT 0 - 44 U/L '16   15   14    '$ ? ? ?Lab Results  ?Component Value Date  ? WBC 14.8 (H) 11/14/2021  ? HGB  11.4 (L) 11/14/2021  ? HCT 34.8 (L) 11/14/2021  ? MCV 92.3 11/14/2021  ? PLT 221 11/14/2021  ? NEUTROABS 13.5 (H) 11/14/2021  ? ? ?No results found for: CEA1, CEA, [id number], CA125, PSA1 ? ?No results found. ? ? ?Future Appointments  ?Date Time Provider [place]  ?11/20/2021  1:45 PM [facility] [place] FLUSH [facility] None  ?11/20/2021  2:30 PM [facility] INFUSION [facility] None  ?11/27/2021  2:45 PM [facility] [place] FLUSH [facility] None  ?11/27/2021  3:20 PM Brunetta Genera, MD [facility] None  ?12/18/2021 12:30 PM [facility] [place] FLUSH [facility] None  ?12/18/2021  1:00 PM Brunetta Genera, MD [facility] None  ?  ? ? LOS: 1 day  ? ?ADDENDUM ? ?.Patient was Personally and independently interviewed, examined and relevant elements of the history of present illness were reviewed in details and an assessment and plan was created. All elements of the patient's history of present illness , assessment and plan were discussed in details with Mikey Bussing, DNP, AGPCNP-BC, AOCNP. The above documentation reflects our combined findings assessment and plan. ?  ?Sullivan Lone MD MS ? ?

## 2021-11-15 ENCOUNTER — Encounter: Payer: Self-pay | Admitting: Hematology

## 2021-11-15 DIAGNOSIS — C8338 Diffuse large B-cell lymphoma, lymph nodes of multiple sites: Secondary | ICD-10-CM | POA: Diagnosis not present

## 2021-11-15 DIAGNOSIS — Z5111 Encounter for antineoplastic chemotherapy: Secondary | ICD-10-CM | POA: Diagnosis not present

## 2021-11-15 LAB — COMPREHENSIVE METABOLIC PANEL
ALT: 14 U/L (ref 0–44)
AST: 15 U/L (ref 15–41)
Albumin: 3.5 g/dL (ref 3.5–5.0)
Alkaline Phosphatase: 43 U/L (ref 38–126)
Anion gap: 5 (ref 5–15)
BUN: 18 mg/dL (ref 6–20)
CO2: 25 mmol/L (ref 22–32)
Calcium: 9.3 mg/dL (ref 8.9–10.3)
Chloride: 111 mmol/L (ref 98–111)
Creatinine, Ser: 0.78 mg/dL (ref 0.61–1.24)
GFR, Estimated: >60 mL/min (ref >60)
Glucose, Bld: 104 mg/dL — ABNORMAL HIGH (ref 70–99)
Potassium: 3.6 mmol/L (ref 3.5–5.1)
Sodium: 141 mmol/L (ref 135–145)
Total Bilirubin: 0.2 mg/dL — ABNORMAL LOW (ref 0.3–1.2)
Total Protein: 5.7 g/dL — ABNORMAL LOW (ref 6.5–8.1)

## 2021-11-15 LAB — CBC WITH DIFFERENTIAL/PLATELET
Abs Immature Granulocytes: 0.07 10*3/uL (ref 0.00–0.07)
Basophils Absolute: 0 10*3/uL (ref 0.0–0.1)
Basophils Relative: 0 %
Eosinophils Absolute: 0 10*3/uL (ref 0.0–0.5)
Eosinophils Relative: 0 %
HCT: 32 % — ABNORMAL LOW (ref 39.0–52.0)
Hemoglobin: 10.6 g/dL — ABNORMAL LOW (ref 13.0–17.0)
Immature Granulocytes: 1 %
Lymphocytes Relative: 7 %
Lymphs Abs: 1 10*3/uL (ref 0.7–4.0)
MCH: 31 pg (ref 26.0–34.0)
MCHC: 33.1 g/dL (ref 30.0–36.0)
MCV: 93.6 fL (ref 80.0–100.0)
Monocytes Absolute: 1.3 10*3/uL — ABNORMAL HIGH (ref 0.1–1.0)
Monocytes Relative: 9 %
Neutro Abs: 12.3 10*3/uL — ABNORMAL HIGH (ref 1.7–7.7)
Neutrophils Relative %: 83 %
Platelets: 208 10*3/uL (ref 150–400)
RBC: 3.42 MIL/uL — ABNORMAL LOW (ref 4.22–5.81)
RDW: 15.9 % — ABNORMAL HIGH (ref 11.5–15.5)
WBC: 14.7 10*3/uL — ABNORMAL HIGH (ref 4.0–10.5)
nRBC: 0 % (ref 0.0–0.2)

## 2021-11-15 MED ORDER — SODIUM CHLORIDE 0.9 % IV SOLN
Freq: Once | INTRAVENOUS | Status: AC
  Administered 2021-11-15: 18 mg via INTRAVENOUS
  Filled 2021-11-15: qty 4

## 2021-11-15 MED ORDER — SODIUM CHLORIDE 0.9 % IV SOLN
Freq: Once | INTRAVENOUS | Status: AC
  Filled 2021-11-15: qty 10

## 2021-11-15 NOTE — Progress Notes (Signed)
?  Transition of Care (TOC) Screening Note ? ? ?Patient Details  ?Name: Allen Hodge ?Date of Birth: [birth year] ? ? ?Transition of Care (TOC) CM/SW Contact:    ?Leanor Voris, Marjie Skiff, RN ?Phone Number: ?11/15/2021, 12:56 PM ? ? ? ?Transition of Care Department [facility]) has reviewed patient and no TOC needs have been identified at this time. We will continue to monitor patient advancement through interdisciplinary progression rounds. If new patient transition needs arise, please place a TOC consult. ?  ?

## 2021-11-16 ENCOUNTER — Encounter: Payer: Self-pay | Admitting: Hematology

## 2021-11-16 DIAGNOSIS — C8338 Diffuse large B-cell lymphoma, lymph nodes of multiple sites: Secondary | ICD-10-CM | POA: Diagnosis not present

## 2021-11-16 DIAGNOSIS — Z5111 Encounter for antineoplastic chemotherapy: Secondary | ICD-10-CM | POA: Diagnosis not present

## 2021-11-16 LAB — CBC WITH DIFFERENTIAL/PLATELET
Abs Immature Granulocytes: 0.08 10*3/uL — ABNORMAL HIGH (ref 0.00–0.07)
Basophils Absolute: 0 10*3/uL (ref 0.0–0.1)
Basophils Relative: 0 %
Eosinophils Absolute: 0 10*3/uL (ref 0.0–0.5)
Eosinophils Relative: 0 %
HCT: 31.6 % — ABNORMAL LOW (ref 39.0–52.0)
Hemoglobin: 10.4 g/dL — ABNORMAL LOW (ref 13.0–17.0)
Immature Granulocytes: 1 %
Lymphocytes Relative: 9 %
Lymphs Abs: 1.1 10*3/uL (ref 0.7–4.0)
MCH: 30.9 pg (ref 26.0–34.0)
MCHC: 32.9 g/dL (ref 30.0–36.0)
MCV: 93.8 fL (ref 80.0–100.0)
Monocytes Absolute: 1.1 10*3/uL — ABNORMAL HIGH (ref 0.1–1.0)
Monocytes Relative: 9 %
Neutro Abs: 9.9 10*3/uL — ABNORMAL HIGH (ref 1.7–7.7)
Neutrophils Relative %: 81 %
Platelets: 206 10*3/uL (ref 150–400)
RBC: 3.37 MIL/uL — ABNORMAL LOW (ref 4.22–5.81)
RDW: 15.9 % — ABNORMAL HIGH (ref 11.5–15.5)
WBC: 12.2 10*3/uL — ABNORMAL HIGH (ref 4.0–10.5)
nRBC: 0 % (ref 0.0–0.2)

## 2021-11-16 LAB — COMPREHENSIVE METABOLIC PANEL
ALT: 13 U/L (ref 0–44)
AST: 14 U/L — ABNORMAL LOW (ref 15–41)
Albumin: 3.5 g/dL (ref 3.5–5.0)
Alkaline Phosphatase: 43 U/L (ref 38–126)
Anion gap: 6 (ref 5–15)
BUN: 24 mg/dL — ABNORMAL HIGH (ref 6–20)
CO2: 26 mmol/L (ref 22–32)
Calcium: 9.3 mg/dL (ref 8.9–10.3)
Chloride: 109 mmol/L (ref 98–111)
Creatinine, Ser: <0.3 mg/dL — ABNORMAL LOW (ref 0.61–1.24)
Glucose, Bld: 107 mg/dL — ABNORMAL HIGH (ref 70–99)
Potassium: 3.8 mmol/L (ref 3.5–5.1)
Sodium: 141 mmol/L (ref 135–145)
Total Bilirubin: 0.3 mg/dL (ref 0.3–1.2)
Total Protein: 5.6 g/dL — ABNORMAL LOW (ref 6.5–8.1)

## 2021-11-16 MED ORDER — SODIUM CHLORIDE 0.9 % IV SOLN
Freq: Once | INTRAVENOUS | Status: AC
  Administered 2021-11-16: 8 mg via INTRAVENOUS
  Filled 2021-11-16: qty 4

## 2021-11-16 MED ORDER — LORAZEPAM 0.5 MG PO TABS: 0.5000 mg | ORAL_TABLET | Freq: Four times a day (QID) | ORAL | 0 refills | Status: DC | PRN

## 2021-11-16 MED ORDER — SODIUM CHLORIDE 0.9 % IV SOLN
Freq: Once | INTRAVENOUS | Status: AC
  Filled 2021-11-16: qty 10

## 2021-11-16 MED ORDER — SODIUM CHLORIDE 0.9 % IV SOLN
750.0000 mg/m2 | Freq: Once | INTRAVENOUS | Status: AC
  Administered 2021-11-17: 1520 mg via INTRAVENOUS
  Filled 2021-11-16 (×2): qty 76

## 2021-11-16 MED ORDER — SODIUM CHLORIDE 0.9 % IV SOLN
Freq: Once | INTRAVENOUS | Status: AC
  Administered 2021-11-17: 16 mg via INTRAVENOUS
  Filled 2021-11-16: qty 8

## 2021-11-16 MED ORDER — ACETAMINOPHEN 325 MG PO TABS: 650.0000 mg | ORAL_TABLET | ORAL | Status: AC | PRN

## 2021-11-16 NOTE — Progress Notes (Addendum)
HEMATOLOGY-ONCOLOGY PROGRESS NOTE ? ?ASSESSMENT AND PLAN: ?Very pleasant 41 year old gentleman with some chronic food related bowel irregularities with ?  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 ?Lymphoma FISH panel shows no evidence of c-Myc rearrangement ruling out double hit or triple hit lymphoma. ?Patient presented with extensive abdominal and retroperitoneal lymphadenopathy, right supraclavicular lymphadenopathy and lung nodules concerning for lymphoma involvement as well as concern for small bowel involvement. ?-PET scan 09/21/2021- "1. Marked interval reduction in size and activity of prior adenopathy in the and abdomen. Much of the prior adenopathy has completely resolved (Deauville 1), with some small remaining lymph nodes demonstrating Deauville 2 levels of activity. The previous abnormal hypermetabolic small bowel wall thickening in the pelvis has completely resolved. 2. Diffuse accentuated metabolic activity in the skeleton, probably attributable to granulocyte stimulation. 3. 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 Prominent stool throughout the colon favors constipation." ?2) pulmonary nodules concerning for lymphoma involvement ?3) 4.4 cm small bowel thickening concerning for involvement by lymphoma.  Cannot rule out adenocarcinoma but less likely. ?4) acute kidney injury.  Patient's creatinine had bumped to 1.6 but on repeat is back down to normal. ?5) Food intolerances including gluten and dairy avoidance. ?  ?PLAN ?-Labs from this morning have been reviewed and remained stable.  He will proceed with day 4 of his chemotherapy today.   ?-He developed grade 2 peripheral neuropathy in his fingertips. Vincristine was held with a cycle of chemotherapy. ?-Continue to check a daily CBC with differential and CMET. ?-Low risk for DVT.  Holding DVT prophylaxis per patient request.  He is ambulating  on a regular basis. ?-Continue home medications and Protonix 40 mg daily. ?-Continue daily MiraLAX and has Senokot-S available to him as needed for constipation. ?-Continue as needed Claritin and Benadryl for allergies. ?-Rituxan and Ellen Henri are scheduled already at the cancer center on day 8 and he has a follow-up visit on 11/27/2021. ?-Anticipate hospital discharge on Friday, 11/17/2021. ? ?Mikey Bussing, DNP, AGPCNP-BC, AOCNP ? ?SUBJECTIVE: Continues to tolerate chemotherapy well overall.  Reports some mild nausea last evening without vomiting.  He took as needed Ativan which was effective.  He does not notice any peripheral neuropathy is fingertips this morning.  Continues to have a nonproductive cough.  Vital signs remain stable. ?   ?Oncology History  ?Diffuse large B-cell lymphoma of lymph nodes of multiple regions [facility])  ?07/21/2021 Initial Diagnosis  ? Diffuse large B-cell lymphoma of lymph nodes of multiple regions [facility]) ?  ?07/27/2021 - 08/19/2021 Chemotherapy  ? Patient is on Treatment Plan : NON-HODGKINS LYMPHOMA R-CHOP q21d  ?   ?09/04/2021 Cancer Staging  ? Staging form: Hodgkin and Non-Hodgkin Lymphoma, AJCC 8th Edition ?- Clinical: Stage IV (Diffuse large B-cell lymphoma) - Signed by Brunetta Genera, MD on 09/04/2021 ?Stage prefix: Initial diagnosis ?  ?09/11/2021 -  Chemotherapy  ? Patient is on Treatment Plan : IP NON-HODGKINS LYMPHOMA EPOCH q21d  ?   ?09/18/2021 -  Chemotherapy  ? Patient is on Treatment Plan : NON-HODGKINS LYMPHOMA Rituximab q21d  ?   ? ? ? ?REVIEW OF SYSTEMS:   ?Review of Systems  ?Constitutional: Negative.   ?HENT: Negative.    ?Eyes: Negative.   ?Respiratory: Negative.    ?Cardiovascular: Negative.   ?Gastrointestinal: Negative.  Negative for nausea and vomiting.  ?Genitourinary: Negative.   ?Musculoskeletal: Negative.   ?Skin: Negative.   ?Neurological:   ?     No peripheral  neuropathy in his fingertips noted this morning  ?Psychiatric/Behavioral: Negative.    ? ?I have reviewed  the past medical history, past surgical history, social history and family history with the patient and they are unchanged from previous note. ? ? ?PHYSICAL EXAMINATION: ?ECOG PERFORMANCE STATUS: 1 - Symptomatic but completely ambulatory ? ?Vitals:  ? 11/15/21 2039 11/16/21 0425  ?BP: 114/71 106/71  ?Pulse: 71 (!) 52  ?Resp: 18 18  ?Temp: 97.9 ?F (36.6 ?C) 97.6 ?F (36.4 ?C)  ?SpO2: 99% 96%  ? ?Filed Weights  ? 11/13/21 1049  ?Weight: 86.2 kg  ? ? ?Intake/Output from previous day: ?04/05 0701 - 04/06 0700 ?In: 2577.5 [P.O.:1200; I.V.:581.1; IV Piggyback:796.5] ?Out: 500 [Urine:500] ? ?Physical Exam ?Vitals reviewed.  ?Constitutional:   ?   General: He is not in acute distress. ?HENT:  ?   Head: Normocephalic.  ?   Mouth/Throat:  ?   Mouth: Mucous membranes are moist.  ?   Pharynx: No oropharyngeal exudate.  ?Eyes:  ?   General: No scleral icterus. ?   Conjunctiva/sclera: Conjunctivae normal.  ?Cardiovascular:  ?   Rate and Rhythm: Normal rate and regular rhythm.  ?Pulmonary:  ?   Effort: Pulmonary effort is normal.  ?   Breath sounds: Normal breath sounds.  ?Abdominal:  ?   General: Bowel sounds are normal.  ?   Palpations: Abdomen is soft.  ?   Tenderness: There is no abdominal tenderness.  ?Musculoskeletal:  ?   Right lower leg: No edema.  ?   Left lower leg: No edema.  ?Skin: ?   General: Skin is warm and dry.  ?Neurological:  ?   Mental Status: He is alert and oriented to person, place, and time.  ?Psychiatric:     ?   Mood and Affect: Mood normal.     ?   Behavior: Behavior normal.     ?   Thought Content: Thought content normal.     ?   Judgment: Judgment normal.  ? ? ?LABORATORY DATA:  ?I have reviewed the data as listed ? ?  Latest Ref Rng & Units 11/16/2021  ?  4:32 AM 11/15/2021  ?  7:40 AM 11/14/2021  ?  4:29 AM  ?CMP  ?Glucose 70 - 99 mg/dL 107   104   132    ?BUN 6 - 20 mg/dL '24   18   15    '$ ?Creatinine 0.61 - 1.24 mg/dL <0.30   0.78   0.76    ?Sodium 135 - 145 mmol/L 141   141   139    ?Potassium 3.5 - 5.1  mmol/L 3.8   3.6   4.2    ?Chloride 98 - 111 mmol/L 109   111   110    ?CO2 22 - 32 mmol/L '26   25   23    '$ ?Calcium 8.9 - 10.3 mg/dL 9.3   9.3   9.5    ?Total Protein 6.5 - 8.1 g/dL 5.6   5.7   6.2    ?Total Bilirubin 0.3 - 1.2 mg/dL 0.3   0.2   0.4    ?Alkaline Phos 38 - 126 U/L 43   43   51    ?AST 15 - 41 U/L '14   15   18    '$ ?ALT 0 - 44 U/L '13   14   16    '$ ? ? ?Lab Results  ?Component Value Date  ? WBC 12.2 (H) 11/16/2021  ?  HGB 10.4 (L) 11/16/2021  ? HCT 31.6 (L) 11/16/2021  ? MCV 93.8 11/16/2021  ? PLT 206 11/16/2021  ? NEUTROABS 9.9 (H) 11/16/2021  ? ? ?No results found for: CEA1, CEA, [id number], CA125, PSA1 ? ?No results found. ? ? ?Future Appointments  ?Date Time Provider [place]  ?11/20/2021  1:45 PM [facility] [place] FLUSH [facility] None  ?11/20/2021  2:30 PM [facility] INFUSION [facility] None  ?11/27/2021  2:45 PM [facility] [place] FLUSH [facility] None  ?11/27/2021  3:20 PM Brunetta Genera, MD [facility] None  ?12/18/2021 12:30 PM [facility] [place] FLUSH [facility] None  ?12/18/2021  1:00 PM Brunetta Genera, MD [facility] None  ?  ? ? LOS: 3 days  ? ? ?ADDENDUM ? ?.Patient was Personally and independently interviewed, examined and relevant elements of the history of present illness were reviewed in details and an assessment and plan was created. All elements of the patient's history of present illness , assessment and plan were discussed in details with Mikey Bussing DNP. The above documentation reflects our combined findings assessment and plan. ?  ?Sullivan Lone MD MS ? ?

## 2021-11-17 ENCOUNTER — Encounter: Payer: Self-pay | Admitting: Hematology

## 2021-11-17 LAB — CBC WITH DIFFERENTIAL/PLATELET
Abs Immature Granulocytes: 0.03 10*3/uL (ref 0.00–0.07)
Basophils Absolute: 0 10*3/uL (ref 0.0–0.1)
Basophils Relative: 0 %
Eosinophils Absolute: 0 10*3/uL (ref 0.0–0.5)
Eosinophils Relative: 0 %
HCT: 31.5 % — ABNORMAL LOW (ref 39.0–52.0)
Hemoglobin: 10.5 g/dL — ABNORMAL LOW (ref 13.0–17.0)
Immature Granulocytes: 0 %
Lymphocytes Relative: 13 %
Lymphs Abs: 1 10*3/uL (ref 0.7–4.0)
MCH: 31.2 pg (ref 26.0–34.0)
MCHC: 33.3 g/dL (ref 30.0–36.0)
MCV: 93.5 fL (ref 80.0–100.0)
Monocytes Absolute: 0.7 10*3/uL (ref 0.1–1.0)
Monocytes Relative: 9 %
Neutro Abs: 6.2 10*3/uL (ref 1.7–7.7)
Neutrophils Relative %: 78 %
Platelets: 219 10*3/uL (ref 150–400)
RBC: 3.37 MIL/uL — ABNORMAL LOW (ref 4.22–5.81)
RDW: 15.5 % (ref 11.5–15.5)
WBC: 7.9 10*3/uL (ref 4.0–10.5)
nRBC: 0 % (ref 0.0–0.2)

## 2021-11-17 LAB — COMPREHENSIVE METABOLIC PANEL
ALT: 14 U/L (ref 0–44)
AST: 12 U/L — ABNORMAL LOW (ref 15–41)
Albumin: 3.4 g/dL — ABNORMAL LOW (ref 3.5–5.0)
Alkaline Phosphatase: 37 U/L — ABNORMAL LOW (ref 38–126)
Anion gap: 6 (ref 5–15)
BUN: 21 mg/dL — ABNORMAL HIGH (ref 6–20)
CO2: 26 mmol/L (ref 22–32)
Calcium: 9.1 mg/dL (ref 8.9–10.3)
Chloride: 109 mmol/L (ref 98–111)
Creatinine, Ser: 0.67 mg/dL (ref 0.61–1.24)
GFR, Estimated: >60 mL/min (ref >60)
Glucose, Bld: 83 mg/dL (ref 70–99)
Potassium: 3.9 mmol/L (ref 3.5–5.1)
Sodium: 141 mmol/L (ref 135–145)
Total Bilirubin: 0.6 mg/dL (ref 0.3–1.2)
Total Protein: 5.6 g/dL — ABNORMAL LOW (ref 6.5–8.1)

## 2021-11-17 MED ORDER — HEPARIN SOD (PORK) LOCK FLUSH 100 UNIT/ML IV SOLN
500.0000 [IU] | Freq: Once | INTRAVENOUS | Status: AC
  Administered 2021-11-17: 500 [IU] via INTRAVENOUS

## 2021-11-17 NOTE — Progress Notes (Signed)
HEMATOLOGY-ONCOLOGY PROGRESS NOTE ?DOS 11/15/2021 ?ASSESSMENT AND PLAN: ?Very pleasant 41 year old gentleman with some chronic food related bowel irregularities with ?  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 ?Lymphoma FISH panel shows no evidence of c-Myc rearrangement ruling out double hit or triple hit lymphoma. ?Patient presented with extensive abdominal and retroperitoneal lymphadenopathy, right supraclavicular lymphadenopathy and lung nodules concerning for lymphoma involvement as well as concern for small bowel involvement. ?-PET scan 09/21/2021- "1. Marked interval reduction in size and activity of prior adenopathy in the and abdomen. Much of the prior adenopathy has completely resolved (Deauville 1), with some small remaining lymph nodes demonstrating Deauville 2 levels of activity. The previous abnormal hypermetabolic small bowel wall thickening in the pelvis has completely resolved. 2. Diffuse accentuated metabolic activity in the skeleton, probably attributable to granulocyte stimulation. 3. 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 Prominent stool throughout the colon favors constipation." ?2) pulmonary nodules concerning for lymphoma involvement ?3) 4.4 cm small bowel thickening concerning for involvement by lymphoma.  Cannot rule out adenocarcinoma but less likely. ?4) acute kidney injury.  Patient's creatinine had bumped to 1.6 but on repeat is back down to normal. ?5) Food intolerances including gluten and dairy avoidance. ?  ?PLAN ?-labs from today C6D3 were reviewed with the patient and are stable. ?-Patient has no acute new positive toxicities from treatment and will continue the plan of care. ?-As discussed with the patient we shall be holding off on intrathecal methotrexate for CNS prophylaxis. ?-Her neuropathy is slightly better and he is not receiving any vincristine with his last cycle  of planned chemotherapy. ?-Continuing other supportive medications. ?-Continue home medications and Protonix 40 mg daily. ?-Continue daily MiraLAX and has Senokot-S available to him as needed for constipation. ?-Continue as needed Claritin and Benadryl for allergies. ?-Rituxan and Ellen Henri are scheduled already at the cancer center on day 8 and he has a follow-up visit on 11/27/2021. ? ?Allen Lone MD MS ?Hematology/Oncology Physician ?[place] ? ?SUBJECTIVE:  ?Patient notes no acute new symptoms today on cycle 6-day 3 of treatment.  Neuropathy about the same or somewhat better.  No new leg swelling.  No chest pain or shortness of breath.  No fevers or chills.  Patient is in good spirits and asking to finish his last cycle of chemotherapy well. ? ?Oncology History  ?Diffuse large B-cell lymphoma of lymph nodes of multiple regions [facility])  ?07/21/2021 Initial Diagnosis  ? Diffuse large B-cell lymphoma of lymph nodes of multiple regions [facility]) ?  ?07/27/2021 - 08/19/2021 Chemotherapy  ? Patient is on Treatment Plan : NON-HODGKINS LYMPHOMA R-CHOP q21d  ?   ?09/04/2021 Cancer Staging  ? Staging form: Hodgkin and Non-Hodgkin Lymphoma, AJCC 8th Edition ?- Clinical: Stage IV (Diffuse large B-cell lymphoma) - Signed by Brunetta Genera, MD on 09/04/2021 ?Stage prefix: Initial diagnosis ?  ?09/11/2021 -  Chemotherapy  ? Patient is on Treatment Plan : IP NON-HODGKINS LYMPHOMA EPOCH q21d  ?   ?09/18/2021 -  Chemotherapy  ? Patient is on Treatment Plan : NON-HODGKINS LYMPHOMA Rituximab q21d  ?   ? ? ? ?REVIEW OF SYSTEMS:   ?10 Point review of Systems was done is negative except as noted above. ? ?I have reviewed the past medical history, past surgical history, social history and family history with the patient and they are unchanged from previous note. ? ? ?PHYSICAL EXAMINATION: ?ECOG PERFORMANCE STATUS: 1 - Symptomatic but completely ambulatory ? ?  Vitals:  ? 11/16/21 1331 11/16/21 2027  ?BP: 111/72 125/83  ?Pulse: 78  (!) 108  ?Resp: 16 16  ?Temp: 98 ?F (36.7 ?C) 97.8 ?F (36.6 ?C)  ?SpO2: 100% 99%  ? ?Filed Weights  ? 11/13/21 1049  ?Weight: 190 lb (86.2 kg)  ? ? ?Intake/Output from previous day: ?04/06 0701 - 04/07 0700 ?In: 360 [P.O.:360] ?Out: -  ?. ?GENERAL:alert, in no acute distress and comfortable ?SKIN: no acute rashes, no significant lesions ?EYES: conjunctiva are pink and non-injected, sclera anicteric ?OROPHARYNX: MMM, no exudates, no oropharyngeal erythema or ulceration ?NECK: supple, no JVD ?LYMPH:  no palpable lymphadenopathy in the cervical, axillary or inguinal regions ?LUNGS: clear to auscultation b/l with normal respiratory effort ?HEART: regular rate & rhythm ?ABDOMEN:  normoactive bowel sounds , non tender, not distended. ?Extremity: no pedal edema ?PSYCH: alert & oriented x 3 with fluent speech ?NEURO: no focal motor/sensory deficits ? ? ?LABORATORY DATA:  ?I have reviewed the data as listed ? ?  Latest Ref Rng & Units 11/15/2021  ?  7:40 AM 11/14/2021  ?  4:29 AM  ?CMP  ?Glucose 70 - 99 mg/dL 104   132    ?BUN 6 - 20 mg/dL 18   15    ?Creatinine 0.61 - 1.24 mg/dL 0.78   0.76    ?Sodium 135 - 145 mmol/L 141   139    ?Potassium 3.5 - 5.1 mmol/L 3.6   4.2    ?Chloride 98 - 111 mmol/L 111   110    ?CO2 22 - 32 mmol/L 25   23    ?Calcium 8.9 - 10.3 mg/dL 9.3   9.5    ?Total Protein 6.5 - 8.1 g/dL 5.7   6.2    ?Total Bilirubin 0.3 - 1.2 mg/dL 0.2   0.4    ?Alkaline Phos 38 - 126 U/L 43   51    ?AST 15 - 41 U/L 15   18    ?ALT 0 - 44 U/L 14   16    ? ?. ? ?  Latest Ref Rng & Units 11/15/2021  ?  7:40 AM 11/14/2021  ?  4:29 AM  ?CBC  ?WBC 4.0 - 10.5 K/uL 14.7   14.8    ?Hemoglobin 13.0 - 17.0 g/dL 10.6   11.4    ?Hematocrit 39.0 - 52.0 % 32.0   34.8    ?Platelets 150 - 400 K/uL 208   221    ? ? ?Future Appointments  ?Date Time Provider [place]  ?11/20/2021  1:45 PM [facility] [place] FLUSH [facility] None  ?11/20/2021  2:30 PM [facility] INFUSION [facility] None  ?11/27/2021  2:45 PM [facility] [place] FLUSH [facility] None   ?11/27/2021  3:20 PM Brunetta Genera, MD [facility] None  ?12/18/2021 12:30 PM [facility] [place] FLUSH [facility] None  ?12/18/2021  1:00 PM Brunetta Genera, MD [facility] None  ?  ? ? LOS: 4 days  ? ? ? ?

## 2021-11-17 NOTE — Discharge Summary (Signed)
Discharge Summary  ?Patient ID: ?Allen Hodge ?MRN: [id number] ?DOB/AGE: [birth year] 41 y.o. ? ?Admit date: 11/13/2021 ?Discharge date: 11/17/2021 ? ?Discharge Diagnoses:  ?Principal Problem: ?  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  Encounter for antineoplastic chemotherapy ? ? ?Discharged Condition: good ? ?Discharge Labs:  ?CBC ?   ?Component Value Date/Time  ? WBC 7.9 11/17/2021 0550  ? RBC 3.37 (L) 11/17/2021 0550  ? HGB 10.5 (L) 11/17/2021 0550  ? HGB 10.5 (L) 11/06/2021 1428  ? HCT 31.5 (L) 11/17/2021 0550  ? PLT 219 11/17/2021 0550  ? PLT 199 11/06/2021 1428  ? MCV 93.5 11/17/2021 0550  ? MCH 31.2 11/17/2021 0550  ? MCHC 33.3 11/17/2021 0550  ? RDW 15.5 11/17/2021 0550  ? LYMPHSABS 1.0 11/17/2021 0550  ? MONOABS 0.7 11/17/2021 0550  ? EOSABS 0.0 11/17/2021 0550  ? BASOSABS 0.0 11/17/2021 0550  ? ? ?  Latest Ref Rng & Units 11/17/2021  ?  5:50 AM 11/16/2021  ?  4:32 AM 11/15/2021  ?  7:40 AM  ?CMP  ?Glucose 70 - 99 mg/dL 83   107   104    ?BUN 6 - 20 mg/dL '21   24   18    '$ ?Creatinine 0.61 - 1.24 mg/dL 0.67   <0.30   0.78    ?Sodium 135 - 145 mmol/L 141   141   141    ?Potassium 3.5 - 5.1 mmol/L 3.9   3.8   3.6    ?Chloride 98 - 111 mmol/L 109   109   111    ?CO2 22 - 32 mmol/L '26   26   25    '$ ?Calcium 8.9 - 10.3 mg/dL 9.1   9.3   9.3    ?Total Protein 6.5 - 8.1 g/dL 5.6   5.6   5.7    ?Total Bilirubin 0.3 - 1.2 mg/dL 0.6   0.3   0.2    ?Alkaline Phos 38 - 126 U/L 37   43   43    ?AST 15 - 41 U/L '12   14   15    '$ ?ALT 0 - 44 U/L '14   13   14    '$ ?  ?Consults: None ? ?Procedures: None ? ?Disposition:  ?Discharge disposition: 01-Home or Self Care ? ? ? ? ? ?Allergies as of 11/17/2021   ? ?   Reactions  ? Bee Pollen-1000-royal Jelly [nutritional Supplements] Anaphylaxis  ? And Bee's Wax - inflammatory reaction per pt   ? Gluten Meal Other (See Comments)  ? Abdominal cramping   ? Lactose Intolerance (gi) Other (See Comments)  ? Abdominal cramping  ? ?  ? ?  ?Medication List   ?  ? ?STOP taking these medications   ? ?allopurinol 100 MG tablet ?Commonly known as: ZYLOPRIM ?  ? ?  ? ?TAKE these medications   ? ?acetaminophen 325 MG tablet ?Commonly known as: TYLENOL ?Take 2 tablets (650 mg total) by mouth every 4 (four) hours as needed for mild pain. ?  ?b complex vitamins capsule ?Take 1 capsule by mouth 3 (three) times a week. ?  ?diphenhydrAMINE 25 MG tablet ?Commonly known as: BENADRYL ?Take 25 mg by mouth at bedtime as needed (seasonal allergies). ?  ?HYDROcodone-acetaminophen 5-325 MG tablet ?Commonly known as: Norco ?Take 1-2 tablets by mouth every 6 (six) hours as needed for moderate pain. ?  ?hydroxypropyl methylcellulose / hypromellose 2.5 % ophthalmic solution ?Commonly known  as: ISOPTO TEARS / GONIOVISC ?Place 1 drop into both eyes 4 (four) times daily as needed for dry eyes. ?  ?ibuprofen 200 MG tablet ?Commonly known as: ADVIL ?Take 400 mg by mouth every 6 (six) hours as needed (pain). ?  ?lidocaine-prilocaine cream ?Commonly known as: EMLA ?Apply to affected area once ?What changed:  ?how much to take ?how to take this ?when to take this ?reasons to take this ?additional instructions ?  ?loratadine 10 MG tablet ?Commonly known as: CLARITIN ?Take 10 mg by mouth daily as needed (seasonal allergies). ?  ?LORazepam 0.5 MG tablet ?Commonly known as: Ativan ?Take 1 tablet (0.5 mg total) by mouth every 6 (six) hours as needed (Nausea or vomiting). ?What changed:  ?when to take this ?reasons to take this ?additional instructions ?  ?magic mouthwash w/lidocaine Soln ?Take 5 mLs by mouth 4 (four) times daily as needed for mouth pain. Rx: ?50 mL viscous lidocaine 2% ?50 mL Maalox ?50 mL diphenhydramine (Benadryl) at 12.5 mg per 5 ml elixir ?50 mL nystatin (100,000U per 5 mL) suspension ?50 mL distilled water ?  ?ondansetron 8 MG tablet ?Commonly known as: Zofran ?Take 1 tablet (8 mg total) by mouth 2 (two) times daily as needed for refractory nausea / vomiting. Start on day 3 after  cyclophosphamide chemotherapy. ?  ?pantoprazole 40 MG tablet ?Commonly known as: PROTONIX ?Take 1 tablet (40 mg total) by mouth daily before breakfast. ?What changed:  ?when to take this ?reasons to take this ?  ?polyethylene glycol 17 g packet ?Commonly known as: MIRALAX / GLYCOLAX ?Take 17 g by mouth daily as needed (constipation). ?  ?predniSONE 20 MG tablet ?Commonly known as: DELTASONE ?Take 20 mg by mouth every morning. ?  ?prochlorperazine 10 MG tablet ?Commonly known as: COMPAZINE ?Take 1 tablet (10 mg total) by mouth every 6 (six) hours as needed (Nausea or vomiting). ?  ?senna-docusate 8.6-50 MG tablet ?Commonly known as: Senokot-S ?Take 1 tablet by mouth at bedtime as needed for mild constipation. ?  ?sodium bicarbonate/sodium chloride Soln ?1 application by Mouth Rinse route as needed for dry mouth. ?  ?VISINE OP ?Place 1 drop into both eyes daily as needed (dry eyes). ?  ?VITAMIN C PO ?Take 1 tablet by mouth 3 (three) times a week. ?  ?VITAMIN D-3 PO ?Take 1 tablet by mouth 3 (three) times a week. ?  ? ?  ? ?HPI: Allen Hodge is a 41 year old male with diffuse large B-cell lymphoma. He initially received 2 cycles of R-CHOP and has tolerated well.  Although patient does not have double hit lymphoma, recommendation was made to switch to [facility] beginning with cycle #3.  He received his first cycle of EPOCH-R beginning on 09/11/2021.  He tolerated this very well.  He had a PET scan performed after his third cycle of chemotherapy which showed a significant response to treatment. ? ?On the day of admission, he was feeling well.  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  Blurred vision had resolved.  He did have a nonproductive cough which she attributed to allergies.  Otherwise, he was not having any fevers, chills, mucositis, chest pain, shortness of breath, abdominal pain, nausea, vomiting, constipation, diarrhea. He was admitted to the hospital on 11/13/2021 for cycle #6 of  chemotherapy. ? ?Hospital Course: Chemotherapy started as planned on the day of admission.  He overall tolerated his chemotherapy well with no reported side effects.  Peripheral neuropathy in his fingertips came and went.  Had mild  nausea on 1 occasion which was controlled well with Ativan. He was seen on the morning of discharge and labs were reviewed.  He is stable for discharge to home today.  A refill for his Ativan was sent to his outpatient pharmacy.  Outpatient follow-up appointments have been scheduled as noted below. ? ?Discharge Instructions   ? ? Diet general   Complete by: As directed ?  ? Increase activity slowly   Complete by: As directed ?  ? ?  ? ?Future Appointments  ?Date Time Provider [place]  ?11/20/2021  1:45 PM [facility] FLUSH [facility] None  ?11/20/2021  2:30 PM [facility] INFUSION [facility] None  ?11/27/2021  2:45 PM [facility] FLUSH [facility] None  ?11/27/2021  3:20 PM Brunetta Genera, MD [facility] None  ?12/18/2021 12:30 PM [facility] FLUSH [facility] None  ?12/18/2021  1:00 PM Brunetta Genera, MD [name] None  ? ?Signed: ?Mikey Bussing ?11/17/2021, 9:28 AM  ? ? ?

## 2021-11-20 ENCOUNTER — Inpatient Hospital Stay: Payer: No Typology Code available for payment source

## 2021-11-20 ENCOUNTER — Inpatient Hospital Stay: Payer: No Typology Code available for payment source | Attending: Hematology

## 2021-11-20 ENCOUNTER — Other Ambulatory Visit: Payer: Self-pay

## 2021-11-20 VITALS — BP 108/64 | HR 86 | Temp 98.0°F | Resp 16

## 2021-11-20 DIAGNOSIS — Z95828 Presence of other vascular implants and grafts: Secondary | ICD-10-CM

## 2021-11-20 DIAGNOSIS — Z7189 Other specified counseling: Secondary | ICD-10-CM

## 2021-11-20 DIAGNOSIS — Z5189 Encounter for other specified aftercare: Secondary | ICD-10-CM | POA: Diagnosis not present

## 2021-11-20 DIAGNOSIS — C8338 Diffuse large B-cell lymphoma, lymph nodes of multiple sites: Secondary | ICD-10-CM

## 2021-11-20 DIAGNOSIS — C8590 Non-Hodgkin lymphoma, unspecified, unspecified site: Secondary | ICD-10-CM | POA: Insufficient documentation

## 2021-11-20 DIAGNOSIS — Z5112 Encounter for antineoplastic immunotherapy: Secondary | ICD-10-CM | POA: Insufficient documentation

## 2021-11-20 LAB — CBC WITH DIFFERENTIAL (CANCER CENTER ONLY)
Abs Immature Granulocytes: 0.06 10*3/uL (ref 0.00–0.07)
Basophils Absolute: 0 10*3/uL (ref 0.0–0.1)
Basophils Relative: 0 %
Eosinophils Absolute: 0 10*3/uL (ref 0.0–0.5)
Eosinophils Relative: 0 %
HCT: 31.7 % — ABNORMAL LOW (ref 39.0–52.0)
Hemoglobin: 10.7 g/dL — ABNORMAL LOW (ref 13.0–17.0)
Immature Granulocytes: 1 %
Lymphocytes Relative: 13 %
Lymphs Abs: 1.3 10*3/uL (ref 0.7–4.0)
MCH: 30.8 pg (ref 26.0–34.0)
MCHC: 33.8 g/dL (ref 30.0–36.0)
MCV: 91.4 fL (ref 80.0–100.0)
Monocytes Absolute: 0.1 10*3/uL (ref 0.1–1.0)
Monocytes Relative: 1 %
Neutro Abs: 8.3 10*3/uL — ABNORMAL HIGH (ref 1.7–7.7)
Neutrophils Relative %: 85 %
Platelet Count: 302 10*3/uL (ref 150–400)
RBC: 3.47 MIL/uL — ABNORMAL LOW (ref 4.22–5.81)
RDW: 14.6 % (ref 11.5–15.5)
WBC Count: 9.7 10*3/uL (ref 4.0–10.5)
nRBC: 0 % (ref 0.0–0.2)

## 2021-11-20 LAB — CMP (CANCER CENTER ONLY)
ALT: 12 U/L (ref 0–44)
AST: 10 U/L — ABNORMAL LOW (ref 15–41)
Albumin: 4 g/dL (ref 3.5–5.0)
Alkaline Phosphatase: 41 U/L (ref 38–126)
Anion gap: 8 (ref 5–15)
BUN: 19 mg/dL (ref 6–20)
CO2: 28 mmol/L (ref 22–32)
Calcium: 9 mg/dL (ref 8.9–10.3)
Chloride: 99 mmol/L (ref 98–111)
Creatinine: 0.75 mg/dL (ref 0.61–1.24)
GFR, Estimated: >60 mL/min (ref >60)
Glucose, Bld: 128 mg/dL — ABNORMAL HIGH (ref 70–99)
Potassium: 4.1 mmol/L (ref 3.5–5.1)
Sodium: 135 mmol/L (ref 135–145)
Total Bilirubin: 0.4 mg/dL (ref 0.3–1.2)
Total Protein: 6.1 g/dL — ABNORMAL LOW (ref 6.5–8.1)

## 2021-11-20 LAB — LACTATE DEHYDROGENASE: LDH: 120 U/L (ref 98–192)

## 2021-11-20 MED ORDER — SODIUM CHLORIDE 0.9 % IV SOLN: Freq: Once | INTRAVENOUS | Status: AC

## 2021-11-20 MED ORDER — SODIUM CHLORIDE 0.9% FLUSH
10.0000 mL | Freq: Once | INTRAVENOUS | Status: AC
  Administered 2021-11-20: 10 mL

## 2021-11-20 MED ORDER — ACETAMINOPHEN 325 MG PO TABS
650.0000 mg | ORAL_TABLET | Freq: Once | ORAL | Status: AC
  Administered 2021-11-20: 650 mg via ORAL
  Filled 2021-11-20: qty 2

## 2021-11-20 MED ORDER — DIPHENHYDRAMINE HCL 25 MG PO CAPS
50.0000 mg | ORAL_CAPSULE | Freq: Once | ORAL | Status: AC
  Administered 2021-11-20: 50 mg via ORAL
  Filled 2021-11-20: qty 2

## 2021-11-20 MED ORDER — SODIUM CHLORIDE 0.9% FLUSH
10.0000 mL | INTRAVENOUS | Status: DC | PRN
  Administered 2021-11-20: 10 mL

## 2021-11-20 MED ORDER — METHYLPREDNISOLONE SODIUM SUCC 125 MG IJ SOLR
125.0000 mg | Freq: Every day | INTRAMUSCULAR | Status: DC
  Administered 2021-11-20: 125 mg via INTRAVENOUS
  Filled 2021-11-20: qty 2

## 2021-11-20 MED ORDER — HEPARIN SOD (PORK) LOCK FLUSH 100 UNIT/ML IV SOLN
500.0000 [IU] | Freq: Once | INTRAVENOUS | Status: AC | PRN
  Administered 2021-11-20: 500 [IU]

## 2021-11-20 MED ORDER — FAMOTIDINE IN NACL 20-0.9 MG/50ML-% IV SOLN
20.0000 mg | Freq: Once | INTRAVENOUS | Status: AC
  Administered 2021-11-20: 20 mg via INTRAVENOUS
  Filled 2021-11-20: qty 50

## 2021-11-20 MED ORDER — SODIUM CHLORIDE 0.9 % IV SOLN
375.0000 mg/m2 | Freq: Once | INTRAVENOUS | Status: AC
  Administered 2021-11-20: 800 mg via INTRAVENOUS
  Filled 2021-11-20: qty 50

## 2021-11-20 MED ORDER — PEGFILGRASTIM-BMEZ 6 MG/0.6ML ~~LOC~~ SOSY
6.0000 mg | PREFILLED_SYRINGE | Freq: Once | SUBCUTANEOUS | Status: AC
  Administered 2021-11-20: 6 mg via SUBCUTANEOUS
  Filled 2021-11-20: qty 0.6

## 2021-11-20 NOTE — Patient Instructions (Signed)
?  Discharge Instructions: ?Thank you for choosing [facility] to provide your oncology and hematology care.  ? ?If you have a lab appointment with the [facility], please go directly to the [facility] and check in at the registration area. ?  ?Wear comfortable clothing and clothing appropriate for easy access to any Portacath or PICC line.  ? ?We strive to give you quality time with your provider. You may need to reschedule your appointment if you arrive late (15 or more minutes).  Arriving late affects you and other patients whose appointments are after yours.  Also, if you miss three or more appointments without notifying the office, you may be dismissed from the clinic at the provider?s discretion.    ?  ?For prescription refill requests, have your pharmacy contact our office and allow 72 hours for refills to be completed.   ? ?Today you received the following chemotherapy and/or immunotherapy agents Rituxan ? ?    ?  ?To help prevent nausea and vomiting after your treatment, we encourage you to take your nausea medication as directed. ? ?BELOW ARE SYMPTOMS THAT SHOULD BE REPORTED IMMEDIATELY: ?*FEVER GREATER THAN 100.4 F (38 ?C) OR HIGHER ?*CHILLS OR SWEATING ?*NAUSEA AND VOMITING THAT IS NOT CONTROLLED WITH YOUR NAUSEA MEDICATION ?*UNUSUAL SHORTNESS OF BREATH ?*UNUSUAL BRUISING OR BLEEDING ?*URINARY PROBLEMS (pain or burning when urinating, or frequent urination) ?*BOWEL PROBLEMS (unusual diarrhea, constipation, pain near the anus) ?TENDERNESS IN MOUTH AND THROAT WITH OR WITHOUT PRESENCE OF ULCERS (sore throat, sores in mouth, or a toothache) ?UNUSUAL RASH, SWELLING OR PAIN  ?UNUSUAL VAGINAL DISCHARGE OR ITCHING  ? ?Items with * indicate a potential emergency and should be followed up as soon as possible or go to the Emergency Department if any problems should occur. ? ?Please show the CHEMOTHERAPY ALERT CARD or IMMUNOTHERAPY ALERT CARD at check-in to the Emergency Department and triage  nurse. ? ?Should you have questions after your visit or need to cancel or reschedule your appointment, please contact [facility]  Dept: [phone]  and follow the prompts.  Office hours are 8:00 a.m. to 4:30 p.m. Monday - Friday. Please note that voicemails left after 4:00 p.m. may not be returned until the following business day.  We are closed weekends and major holidays. You have access to a nurse at all times for urgent questions. Please call the main number to the clinic Dept: [phone] and follow the prompts. ? ? ?For any non-urgent questions, you may also contact your provider using MyChart. We now offer e-Visits for anyone 71 and older to request care online for non-urgent symptoms. For details visit [email or web address]. ?  ?Also download the MyChart app! Go to the app store, search "MyChart", open the app, select [facility], and log in with your MyChart username and password. ? ?Due to Covid, a mask is required upon entering the hospital/clinic. If you do not have a mask, one will be given to you upon arrival. For doctor visits, patients may have 1 support person aged 55 or older with them. For treatment visits, patients cannot have anyone with them due to current Covid guidelines and our immunocompromised population.  ?Rituximab Injection ?What is this medication? ?RITUXIMAB (ri TUX i mab) is a monoclonal antibody. It is used to treat certain types of cancer like non-Hodgkin lymphoma and chronic lymphocytic leukemia. It is also used to treat rheumatoid arthritis, granulomatosis with polyangiitis, microscopic polyangiitis, and pemphigus vulgaris. ?This medicine may be used for  other purposes; ask your health care provider or pharmacist if you have questions. ?COMMON BRAND NAME(S): RIABNI, Rituxan, RUXIENCE ?What should I tell my care team before I take this medication? ?They need to know if you have any of these conditions: ?chest pain ?heart disease ?infection  especially a viral infection such as chickenpox, cold sores, hepatitis B, or herpes ?immune system problems ?irregular heartbeat or rhythm ?kidney disease ?low blood counts (white cells, platelets, or red cells) ?lung disease ?recent or upcoming vaccine ?an unusual or allergic reaction to rituximab, other medicines, foods, dyes, or preservatives ?pregnant or trying to get pregnant ?breast-feeding ?How should I use this medication? ?This medicine is injected into a vein. It is given by a health care provider in a hospital or clinic setting. ?A special MedGuide will be given to you before each treatment. Be sure to read this information carefully each time. ?Talk to your health care provider about the use of this medicine in children. While this drug may be prescribed for children as young as 6 months for selected conditions, precautions do apply. ?Overdosage: If you think you have taken too much of this medicine contact a poison control center or emergency room at once. ?NOTE: This medicine is only for you. Do not share this medicine with others. ?What if I miss a dose? ?Keep appointments for follow-up doses. It is important not to miss your dose. Call your health care provider if you are unable to keep an appointment. ?What may interact with this medication? ?Do not take this medicine with any of the following medicines: ?live vaccines ?This medicine may also interact with the following medicines: ?cisplatin ?This list may not describe all possible interactions. Give your health care provider a list of all the medicines, herbs, non-prescription drugs, or dietary supplements you use. Also tell them if you smoke, drink alcohol, or use illegal drugs. Some items may interact with your medicine. ?What should I watch for while using this medication? ?Your condition will be monitored carefully while you are receiving this medicine. You may need blood work done while you are taking this medicine. ?This medicine can cause  serious infusion reactions. To reduce the risk your health care provider may give you other medicines to take before receiving this one. Be sure to follow the directions from your health care provider. ?This medicine may increase your risk of getting an infection. Call your health care provider for advice if you get a fever, chills, sore throat, or other symptoms of a cold or flu. Do not treat yourself. Try to avoid being around people who are sick. ?Call your health care provider if you are around anyone with measles, chickenpox, or if you develop sores or blisters that do not heal properly. ?Avoid taking medicines that contain aspirin, acetaminophen, ibuprofen, naproxen, or ketoprofen unless instructed by your health care provider. These medicines may hide a fever. ?This medicine may cause serious skin reactions. They can happen weeks to months after starting the medicine. Contact your health care provider right away if you notice fevers or flu-like symptoms with a rash. The rash may be red or purple and then turn into blisters or peeling of the skin. Or, you might notice a red rash with swelling of the face, lips or lymph nodes in your neck or under your arms. ?In some patients, this medicine may cause a serious brain infection that may cause death. If you have any problems seeing, thinking, speaking, walking, or standing, tell your healthcare professional right away.  If you cannot reach your healthcare professional, urgently seek other source of medical care. ?Do not become pregnant while taking this medicine or for at least 12 months after stopping it. Women should inform their health care provider if they wish to become pregnant or think they might be pregnant. There is potential for serious harm to an unborn child. Talk to your health care provider for more information. Women should use a reliable form of birth control while taking this medicine and for 12 months after stopping it. Do not breast-feed while  taking this medicine or for at least 6 months after stopping it. ?What side effects may I notice from receiving this medication? ?Side effects that you should report to your health care provider as soon as possib

## 2021-11-27 ENCOUNTER — Other Ambulatory Visit: Payer: Self-pay

## 2021-11-27 ENCOUNTER — Inpatient Hospital Stay: Payer: No Typology Code available for payment source

## 2021-11-27 ENCOUNTER — Inpatient Hospital Stay (HOSPITAL_BASED_OUTPATIENT_CLINIC_OR_DEPARTMENT_OTHER): Payer: No Typology Code available for payment source | Admitting: Hematology

## 2021-11-27 VITALS — BP 96/74 | HR 82 | Temp 97.9°F | Resp 20 | Wt 190.3 lb

## 2021-11-27 DIAGNOSIS — C8338 Diffuse large B-cell lymphoma, lymph nodes of multiple sites: Secondary | ICD-10-CM

## 2021-11-27 DIAGNOSIS — Z5112 Encounter for antineoplastic immunotherapy: Secondary | ICD-10-CM | POA: Diagnosis not present

## 2021-11-27 DIAGNOSIS — Z95828 Presence of other vascular implants and grafts: Secondary | ICD-10-CM

## 2021-11-27 DIAGNOSIS — Z7189 Other specified counseling: Secondary | ICD-10-CM

## 2021-11-27 LAB — CBC WITH DIFFERENTIAL (CANCER CENTER ONLY)
Abs Immature Granulocytes: 3.83 10*3/uL — ABNORMAL HIGH (ref 0.00–0.07)
Basophils Absolute: 0.1 10*3/uL (ref 0.0–0.1)
Basophils Relative: 0 %
Eosinophils Absolute: 0.1 10*3/uL (ref 0.0–0.5)
Eosinophils Relative: 0 %
HCT: 32 % — ABNORMAL LOW (ref 39.0–52.0)
Hemoglobin: 10.5 g/dL — ABNORMAL LOW (ref 13.0–17.0)
Immature Granulocytes: 16 %
Lymphocytes Relative: 9 %
Lymphs Abs: 2.1 10*3/uL (ref 0.7–4.0)
MCH: 30.4 pg (ref 26.0–34.0)
MCHC: 32.8 g/dL (ref 30.0–36.0)
MCV: 92.8 fL (ref 80.0–100.0)
Monocytes Absolute: 2.4 10*3/uL — ABNORMAL HIGH (ref 0.1–1.0)
Monocytes Relative: 10 %
Neutro Abs: 16.1 10*3/uL — ABNORMAL HIGH (ref 1.7–7.7)
Neutrophils Relative %: 65 %
Platelet Count: 214 10*3/uL (ref 150–400)
RBC: 3.45 MIL/uL — ABNORMAL LOW (ref 4.22–5.81)
RDW: 15.3 % (ref 11.5–15.5)
WBC Count: 24.7 10*3/uL — ABNORMAL HIGH (ref 4.0–10.5)
nRBC: 0.6 % — ABNORMAL HIGH (ref 0.0–0.2)

## 2021-11-27 LAB — CMP (CANCER CENTER ONLY)
ALT: 15 U/L (ref 0–44)
AST: 14 U/L — ABNORMAL LOW (ref 15–41)
Albumin: 4.1 g/dL (ref 3.5–5.0)
Alkaline Phosphatase: 73 U/L (ref 38–126)
Anion gap: 7 (ref 5–15)
BUN: 10 mg/dL (ref 6–20)
CO2: 28 mmol/L (ref 22–32)
Calcium: 9.4 mg/dL (ref 8.9–10.3)
Chloride: 102 mmol/L (ref 98–111)
Creatinine: 1.28 mg/dL — ABNORMAL HIGH (ref 0.61–1.24)
GFR, Estimated: >60 mL/min (ref >60)
Glucose, Bld: 103 mg/dL — ABNORMAL HIGH (ref 70–99)
Potassium: 3.8 mmol/L (ref 3.5–5.1)
Sodium: 137 mmol/L (ref 135–145)
Total Bilirubin: 0.2 mg/dL — ABNORMAL LOW (ref 0.3–1.2)
Total Protein: 6.7 g/dL (ref 6.5–8.1)

## 2021-11-27 LAB — LACTATE DEHYDROGENASE: LDH: 248 U/L — ABNORMAL HIGH (ref 98–192)

## 2021-11-27 MED ORDER — HEPARIN SOD (PORK) LOCK FLUSH 100 UNIT/ML IV SOLN
500.0000 [IU] | Freq: Once | INTRAVENOUS | Status: AC
  Administered 2021-11-27: 500 [IU]

## 2021-11-27 MED ORDER — SODIUM CHLORIDE 0.9% FLUSH
10.0000 mL | Freq: Once | INTRAVENOUS | Status: AC
  Administered 2021-11-27: 10 mL

## 2021-11-29 ENCOUNTER — Encounter: Payer: Self-pay | Admitting: Hematology

## 2021-11-29 NOTE — Progress Notes (Signed)
? ? ?HEMATOLOGY/ONCOLOGY CLINIC NOTE ? ?Date of Service: .11/27/2021 ? ? ?Patient Care Team: ?[name], MD as PCP - General (Family Medicine) ? ?CHIEF COMPLAINTS/PURPOSE OF CONSULTATION:  ? ?Follow-up for continued evaluation and management of large B-cell lymphoma ? ?HISTORY OF PRESENTING ILLNESS:  ? ?Please see previous notes for details on initial presentation ? ?INTERVAL HISTORY ? ?[name] is here for follow-up of his large B-cell lymphoma after having completed his sixth cycle of EPOCH-R chemotherapy. ?He notes grade 1 through 2 fatigue after his chemotherapy but this week his fatigue has improved and his appetite has returned and he is eating better. ?We had held his vincristine for cycle 6 given his grade 1 through 2 neuropathy which has now improved to grade 1.  He notes some numbness in the tips of his fingers but no pain or discomfort.  This is not affecting his ability to work or function otherwise. ? ?No abdominal pain or distention.  No significant change in bowel habits. ?No fevers no chills no night sweats. ?Labs done today were reviewed in detail. ?Patient does acknowledge that the treatment journey has been trying although he does acknowledge that he has tolerated his treatment better than he expected. ?He is understandably anxious and came to have a good PET/CT result. ? ?MEDICAL HISTORY:  ?Past Medical History:  ?Diagnosis Date  ? Allergy   ? Cancer [facility])   ? skin cancer  ? Herniated disc   ? Hx of skin cancer, basal cell   ? Hyperlipidemia   ? borderline- no meds   ? ? ?SURGICAL HISTORY: ?Past Surgical History:  ?Procedure Laterality Date  ? COLONOSCOPY    ? last 2013  ? ESOPHAGOGASTRODUODENOSCOPY (EGD) WITH PROPOFOL N/A 06/29/2021  ? Procedure: ESOPHAGOGASTRODUODENOSCOPY (EGD) WITH PROPOFOL;  Surgeon: Milus Banister, MD;  Location: [facility] ENDOSCOPY;  Service: Endoscopy;  Laterality: N/A;  ? EUS N/A 06/29/2021  ? Procedure: UPPER ENDOSCOPIC ULTRASOUND (EUS) RADIAL;   Surgeon: Milus Banister, MD;  Location: [facility] ENDOSCOPY;  Service: Endoscopy;  Laterality: N/A;  ? FINE NEEDLE ASPIRATION N/A 06/29/2021  ? Procedure: FINE NEEDLE ASPIRATION (FNA) LINEAR;  Surgeon: Milus Banister, MD;  Location: [facility] ENDOSCOPY;  Service: Endoscopy;  Laterality: N/A;  ? IR IMAGING GUIDED PORT INSERTION  07/26/2021  ? VASECTOMY    ? ? ?SOCIAL HISTORY: ?Social History  ? ?Socioeconomic History  ? Marital status: Married  ?  Spouse name: Not on file  ? Number of children: 1  ? Years of education: Not on file  ? Highest education level: Not on file  ?Occupational History  ? Occupation: Sales   ?Tobacco Use  ? Smoking status: Never  ? Smokeless tobacco: Never  ?Substance and Sexual Activity  ? Alcohol use: Not Currently  ?  Alcohol/week: 1.0 standard drink  ?  Types: 1 Glasses of wine per week  ?  Comment: Rare   ? Drug use: No  ? Sexual activity: Not on file  ?Other Topics Concern  ? Not on file  ?Social History Narrative  ? Daily caffeine   ? ?Social Determinants of Health  ? ?Financial Resource Strain: Not on file  ?Food Insecurity: Not on file  ?Transportation Needs: Not on file  ?Physical Activity: Not on file  ?Stress: Not on file  ?Social Connections: Not on file  ?Intimate Partner Violence: Not on file  ? ? ?FAMILY HISTORY: ?Family History  ?Problem Relation Age of Onset  ? Colon cancer Mother 39  ? Colon  polyps Mother   ? Colon cancer Paternal Grandfather   ? Alzheimer's disease Paternal Grandfather   ? Breast cancer Maternal Aunt   ? Diabetes Maternal Grandmother   ? Prostate cancer Maternal Grandfather   ? Esophageal cancer Neg Hx   ? Rectal cancer Neg Hx   ? Stomach cancer Neg Hx   ? ? ?ALLERGIES:  is allergic to bee pollen-1000-royal jelly [nutritional supplements], gluten meal, and lactose intolerance (gi). ? ?MEDICATIONS:  ?Current Outpatient Medications  ?Medication Sig Dispense Refill  ? acetaminophen (TYLENOL) 325 MG tablet Take 2 tablets (650 mg total) by mouth every 4 (four) hours as  needed for mild pain.    ? Ascorbic Acid (VITAMIN C PO) Take 1 tablet by mouth 3 (three) times a week.    ? b complex vitamins capsule Take 1 capsule by mouth 3 (three) times a week.    ? Cholecalciferol (VITAMIN D-3 PO) Take 1 tablet by mouth 3 (three) times a week.    ? diphenhydrAMINE (BENADRYL) 25 MG tablet Take 25 mg by mouth at bedtime as needed (seasonal allergies).    ? HYDROcodone-acetaminophen (NORCO) 5-325 MG tablet Take 1-2 tablets by mouth every 6 (six) hours as needed for moderate pain. (Patient not taking: Reported on 11/13/2021) 50 tablet 0  ? hydroxypropyl methylcellulose / hypromellose (ISOPTO TEARS / GONIOVISC) 2.5 % ophthalmic solution Place 1 drop into both eyes 4 (four) times daily as needed for dry eyes. (Patient not taking: Reported on 11/13/2021) 15 mL 12  ? ibuprofen (ADVIL) 200 MG tablet Take 400 mg by mouth every 6 (six) hours as needed (pain). (Patient not taking: Reported on 11/13/2021)    ? lidocaine-prilocaine (EMLA) cream Apply to affected area once (Patient taking differently: Apply 1 application. topically daily as needed (prior to port access).) 30 g 3  ? loratadine (CLARITIN) 10 MG tablet Take 10 mg by mouth daily as needed (seasonal allergies).    ? LORazepam (ATIVAN) 0.5 MG tablet Take 1 tablet (0.5 mg total) by mouth every 6 (six) hours as needed (Nausea or vomiting). 30 tablet 0  ? magic mouthwash w/lidocaine SOLN Take 5 mLs by mouth 4 (four) times daily as needed for mouth pain. Rx: ?50 mL viscous lidocaine 2% ?50 mL Maalox ?50 mL diphenhydramine (Benadryl) at 12.5 mg per 5 ml elixir ?50 mL nystatin (100,000U per 5 mL) suspension ?50 mL distilled water (Patient not taking: Reported on 10/02/2021) 250 mL 1  ? ondansetron (ZOFRAN) 8 MG tablet Take 1 tablet (8 mg total) by mouth 2 (two) times daily as needed for refractory nausea / vomiting. Start on day 3 after cyclophosphamide chemotherapy. (Patient not taking: Reported on 09/12/2021) 30 tablet 1  ? pantoprazole (PROTONIX) 40 MG  tablet Take 1 tablet (40 mg total) by mouth daily before breakfast. (Patient taking differently: Take 40 mg by mouth daily as needed (heartburn).) 30 tablet 2  ? polyethylene glycol (MIRALAX / GLYCOLAX) 17 g packet Take 17 g by mouth daily as needed (constipation).    ? predniSONE (DELTASONE) 20 MG tablet Take 20 mg by mouth every morning. (Patient not taking: Reported on 11/13/2021)    ? prochlorperazine (COMPAZINE) 10 MG tablet Take 1 tablet (10 mg total) by mouth every 6 (six) hours as needed (Nausea or vomiting). (Patient not taking: Reported on 11/13/2021) 30 tablet 6  ? senna-docusate (SENOKOT-S) 8.6-50 MG tablet Take 1 tablet by mouth at bedtime as needed for mild constipation. (Patient not taking: Reported on 11/13/2021)    ?  Sodium Chloride-Sodium Bicarb (SODIUM BICARBONATE/SODIUM CHLORIDE) SOLN 1 application by Mouth Rinse route as needed for dry mouth. (Patient not taking: Reported on 11/13/2021)    ? Tetrahydrozoline HCl (VISINE OP) Place 1 drop into both eyes daily as needed (dry eyes).    ? ?No current facility-administered medications for this visit.  ? ? ?REVIEW OF SYSTEMS:   ?10 Point review of Systems was done is negative except as noted above. ? ?PHYSICAL EXAMINATION: ?ECOG PERFORMANCE STATUS: 1 - Symptomatic but completely ambulatory ? ?Vitals:  ? 11/27/21 1536  ?BP: 96/74  ?Pulse: 82  ?Resp: 20  ?Temp: 97.9 ?F (36.6 ?C)  ?SpO2: 100%  ? ?Filed Weights  ? 11/27/21 1536  ?Weight: 190 lb 4.8 oz (86.3 kg)  ? ?.Body mass index is 24.43 kg/m?. ?. ?GENERAL:alert, in no acute distress and comfortable ?SKIN: no acute rashes, no significant lesions ?EYES: conjunctiva are pink and non-injected, sclera anicteric ?OROPHARYNX: MMM, no exudates, no oropharyngeal erythema or ulceration ?NECK: supple, no JVD ?LYMPH:  no palpable lymphadenopathy in the cervical, axillary or inguinal regions ?LUNGS: clear to auscultation b/l with normal respiratory effort ?HEART: regular rate & rhythm ?ABDOMEN:  normoactive bowel sounds ,  non tender, not distended. ?Extremity: no pedal edema ?PSYCH: alert & oriented x 3 with fluent speech ?NEURO: no focal motor/sensory deficits ? ? ?LABORATORY DATA:  ? ?I have reviewed the data as listed ? ? ?  [place]

## 2021-12-13 ENCOUNTER — Other Ambulatory Visit (HOSPITAL_COMMUNITY): Payer: No Typology Code available for payment source

## 2021-12-14 ENCOUNTER — Encounter (HOSPITAL_COMMUNITY)
Admission: RE | Admit: 2021-12-14 | Discharge: 2021-12-14 | Disposition: A | Discharge status: Home / Self Care | Payer: No Typology Code available for payment source | Source: Ambulatory Visit | Attending: Hematology | Admitting: Hematology

## 2021-12-14 DIAGNOSIS — C8338 Diffuse large B-cell lymphoma, lymph nodes of multiple sites: Secondary | ICD-10-CM | POA: Insufficient documentation

## 2021-12-14 LAB — GLUCOSE, CAPILLARY: Glucose-Capillary: 95 mg/dL (ref 70–99)

## 2021-12-14 MED ORDER — FLUDEOXYGLUCOSE F - 18 (FDG) INJECTION
9.5000 | Freq: Once | INTRAVENOUS | Status: AC
  Administered 2021-12-14: 9.12 via INTRAVENOUS

## 2021-12-18 ENCOUNTER — Inpatient Hospital Stay: Payer: No Typology Code available for payment source | Attending: Hematology | Admitting: Hematology

## 2021-12-18 ENCOUNTER — Other Ambulatory Visit: Payer: Self-pay

## 2021-12-18 ENCOUNTER — Inpatient Hospital Stay: Payer: No Typology Code available for payment source

## 2021-12-18 VITALS — BP 98/72 | HR 65 | Temp 97.8°F | Resp 18 | Ht 74.0 in | Wt 192.7 lb

## 2021-12-18 DIAGNOSIS — R5383 Other fatigue: Secondary | ICD-10-CM | POA: Insufficient documentation

## 2021-12-18 DIAGNOSIS — G629 Polyneuropathy, unspecified: Secondary | ICD-10-CM | POA: Diagnosis not present

## 2021-12-18 DIAGNOSIS — C8338 Diffuse large B-cell lymphoma, lymph nodes of multiple sites: Secondary | ICD-10-CM

## 2021-12-18 DIAGNOSIS — C8511 Unspecified B-cell lymphoma, lymph nodes of head, face, and neck: Secondary | ICD-10-CM | POA: Diagnosis not present

## 2021-12-18 DIAGNOSIS — Z95828 Presence of other vascular implants and grafts: Secondary | ICD-10-CM

## 2021-12-18 DIAGNOSIS — Z7189 Other specified counseling: Secondary | ICD-10-CM

## 2021-12-18 LAB — CMP (CANCER CENTER ONLY)
ALT: 9 U/L (ref 0–44)
AST: 13 U/L — ABNORMAL LOW (ref 15–41)
Albumin: 4.2 g/dL (ref 3.5–5.0)
Alkaline Phosphatase: 46 U/L (ref 38–126)
Anion gap: 4 — ABNORMAL LOW (ref 5–15)
BUN: 12 mg/dL (ref 6–20)
CO2: 29 mmol/L (ref 22–32)
Calcium: 9.4 mg/dL (ref 8.9–10.3)
Chloride: 106 mmol/L (ref 98–111)
Creatinine: 1.26 mg/dL — ABNORMAL HIGH (ref 0.61–1.24)
GFR, Estimated: >60 mL/min (ref >60)
Glucose, Bld: 96 mg/dL (ref 70–99)
Potassium: 4.1 mmol/L (ref 3.5–5.1)
Sodium: 139 mmol/L (ref 135–145)
Total Bilirubin: 0.3 mg/dL (ref 0.3–1.2)
Total Protein: 6.6 g/dL (ref 6.5–8.1)

## 2021-12-18 LAB — CBC WITH DIFFERENTIAL (CANCER CENTER ONLY)
Abs Immature Granulocytes: 0.01 10*3/uL (ref 0.00–0.07)
Basophils Absolute: 0.1 10*3/uL (ref 0.0–0.1)
Basophils Relative: 2 %
Eosinophils Absolute: 0.2 10*3/uL (ref 0.0–0.5)
Eosinophils Relative: 3 %
HCT: 35.2 % — ABNORMAL LOW (ref 39.0–52.0)
Hemoglobin: 11.4 g/dL — ABNORMAL LOW (ref 13.0–17.0)
Immature Granulocytes: 0 %
Lymphocytes Relative: 20 %
Lymphs Abs: 1.1 10*3/uL (ref 0.7–4.0)
MCH: 29.9 pg (ref 26.0–34.0)
MCHC: 32.4 g/dL (ref 30.0–36.0)
MCV: 92.4 fL (ref 80.0–100.0)
Monocytes Absolute: 0.5 10*3/uL (ref 0.1–1.0)
Monocytes Relative: 9 %
Neutro Abs: 3.8 10*3/uL (ref 1.7–7.7)
Neutrophils Relative %: 66 %
Platelet Count: 255 10*3/uL (ref 150–400)
RBC: 3.81 MIL/uL — ABNORMAL LOW (ref 4.22–5.81)
RDW: 15.1 % (ref 11.5–15.5)
WBC Count: 5.7 10*3/uL (ref 4.0–10.5)
nRBC: 0 % (ref 0.0–0.2)

## 2021-12-18 LAB — LACTATE DEHYDROGENASE: LDH: 129 U/L (ref 98–192)

## 2021-12-18 MED ORDER — HEPARIN SOD (PORK) LOCK FLUSH 100 UNIT/ML IV SOLN
500.0000 [IU] | Freq: Once | INTRAVENOUS | Status: AC
  Administered 2021-12-18: 500 [IU]

## 2021-12-18 MED ORDER — SODIUM CHLORIDE 0.9% FLUSH
10.0000 mL | Freq: Once | INTRAVENOUS | Status: AC
  Administered 2021-12-18: 10 mL

## 2021-12-19 NOTE — Progress Notes (Signed)
? ? ?HEMATOLOGY/ONCOLOGY CLINIC NOTE ? ?Date of Service: .12/18/2021 ? ? ?Patient Care Team: ?[name], MD as PCP - General (Family Medicine) ? ?CHIEF COMPLAINTS/PURPOSE OF CONSULTATION:  ? ?Posttreatment follow-up for large B-cell lymphoma and to review posttreatment PET scan. ? ?HISTORY OF PRESENTING ILLNESS:  ? ?Please see previous notes for details on initial presentation ? ?INTERVAL HISTORY ? ?[name] is here for follow-up of his large B-cell lymphoma and to review his posttreatment PET scan. ?He notes no acute new symptoms since his last clinic visit. ?Recovering grade 1 fatigue. 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 ?Staying physically active and trying to eat healthy. ?No other acute new symptoms. ?His PET CT scan done on 12/14/2021 shows complete remission with no evidence of residual hypermetabolic disease. ?We discussed general wellness strategies and surveillance recommendations in details. ?Labs done today were reviewed with the patient  ? ?MEDICAL HISTORY:  ?Past Medical History:  ?Diagnosis Date  ? Allergy   ? Cancer [facility])   ? skin cancer  ? Herniated disc   ? Hx of skin cancer, basal cell   ? Hyperlipidemia   ? borderline- no meds   ? ? ?SURGICAL HISTORY: ?Past Surgical History:  ?Procedure Laterality Date  ? COLONOSCOPY    ? last 2013  ? ESOPHAGOGASTRODUODENOSCOPY (EGD) WITH PROPOFOL N/A 06/29/2021  ? Procedure: ESOPHAGOGASTRODUODENOSCOPY (EGD) WITH PROPOFOL;  Surgeon: Milus Banister, MD;  Location: [facility];  Service: Endoscopy;  Laterality: N/A;  ? EUS N/A 06/29/2021  ? Procedure: UPPER ENDOSCOPIC ULTRASOUND (EUS) RADIAL;  Surgeon: Milus Banister, MD;  Location: [facility];  Service: Endoscopy;  Laterality: N/A;  ? FINE NEEDLE ASPIRATION N/A 06/29/2021  ? Procedure: FINE NEEDLE ASPIRATION (FNA) LINEAR;  Surgeon: Milus Banister, MD;  Location: [facility];  Service: Endoscopy;  Laterality: N/A;  ? IR IMAGING GUIDED PORT INSERTION  07/26/2021  ?  VASECTOMY    ? ? ?SOCIAL HISTORY: ?Social History  ? ?Socioeconomic History  ? Marital status: Married  ?  Spouse name: Not on file  ? Number of children: 1  ? Years of education: Not on file  ? Highest education level: Not on file  ?Occupational History  ? Occupation: Sales   ?Tobacco Use  ? Smoking status: Never  ? Smokeless tobacco: Never  ?Substance and Sexual Activity  ? Alcohol use: Not Currently  ?  Alcohol/week: 1.0 standard drink  ?  Types: 1 Glasses of wine per week  ?  Comment: Rare   ? Drug use: No  ? Sexual activity: Not on file  ?Other Topics Concern  ? Not on file  ?Social History Narrative  ? Daily caffeine   ? ?Social Determinants of Health  ? ?Financial Resource Strain: Not on file  ?Food Insecurity: Not on file  ?Transportation Needs: Not on file  ?Physical Activity: Not on file  ?Stress: Not on file  ?Social Connections: Not on file  ?Intimate Partner Violence: Not on file  ? ? ?FAMILY HISTORY: ?Family History  ?Problem Relation Age of Onset  ? Colon cancer Mother 24  ? Colon polyps Mother   ? Colon cancer Paternal Grandfather   ? Alzheimer's disease Paternal Grandfather   ? Breast cancer Maternal Aunt   ? Diabetes Maternal Grandmother   ? Prostate cancer Maternal Grandfather   ? Esophageal cancer Neg Hx   ? Rectal cancer Neg Hx   ? Stomach cancer Neg Hx   ? ? ?ALLERGIES:  is allergic to bee  pollen-1000-royal jelly [nutritional supplements], gluten meal, and lactose intolerance (gi). ? ?MEDICATIONS:  ?Current Outpatient Medications  ?Medication Sig Dispense Refill  ? acetaminophen (TYLENOL) 325 MG tablet Take 2 tablets (650 mg total) by mouth every 4 (four) hours as needed for mild pain.    ? Ascorbic Acid (VITAMIN C PO) Take 1 tablet by mouth 3 (three) times a week.    ? b complex vitamins capsule Take 1 capsule by mouth 3 (three) times a week.    ? Cholecalciferol (VITAMIN D-3 PO) Take 1 tablet by mouth 3 (three) times a week.    ? diphenhydrAMINE (BENADRYL) 25 MG tablet Take 25 mg by mouth at  bedtime as needed (seasonal allergies).    ? HYDROcodone-acetaminophen (NORCO) 5-325 MG tablet Take 1-2 tablets by mouth every 6 (six) hours as needed for moderate pain. (Patient not taking: Reported on 11/13/2021) 50 tablet 0  ? hydroxypropyl methylcellulose / hypromellose (ISOPTO TEARS / GONIOVISC) 2.5 % ophthalmic solution Place 1 drop into both eyes 4 (four) times daily as needed for dry eyes. (Patient not taking: Reported on 11/13/2021) 15 mL 12  ? ibuprofen (ADVIL) 200 MG tablet Take 400 mg by mouth every 6 (six) hours as needed (pain). (Patient not taking: Reported on 11/13/2021)    ? lidocaine-prilocaine (EMLA) cream Apply to affected area once (Patient taking differently: Apply 1 application. topically daily as needed (prior to port access).) 30 g 3  ? loratadine (CLARITIN) 10 MG tablet Take 10 mg by mouth daily as needed (seasonal allergies).    ? LORazepam (ATIVAN) 0.5 MG tablet Take 1 tablet (0.5 mg total) by mouth every 6 (six) hours as needed (Nausea or vomiting). 30 tablet 0  ? magic mouthwash w/lidocaine SOLN Take 5 mLs by mouth 4 (four) times daily as needed for mouth pain. Rx: ?50 mL viscous lidocaine 2% ?50 mL Maalox ?50 mL diphenhydramine (Benadryl) at 12.5 mg per 5 ml elixir ?50 mL nystatin (100,000U per 5 mL) suspension ?50 mL distilled water (Patient not taking: Reported on 10/02/2021) 250 mL 1  ? ondansetron (ZOFRAN) 8 MG tablet Take 1 tablet (8 mg total) by mouth 2 (two) times daily as needed for refractory nausea / vomiting. Start on day 3 after cyclophosphamide chemotherapy. (Patient not taking: Reported on 09/12/2021) 30 tablet 1  ? pantoprazole (PROTONIX) 40 MG tablet Take 1 tablet (40 mg total) by mouth daily before breakfast. (Patient taking differently: Take 40 mg by mouth daily as needed (heartburn).) 30 tablet 2  ? polyethylene glycol (MIRALAX / GLYCOLAX) 17 g packet Take 17 g by mouth daily as needed (constipation).    ? predniSONE (DELTASONE) 20 MG tablet Take 20 mg by mouth every  morning. (Patient not taking: Reported on 11/13/2021)    ? prochlorperazine (COMPAZINE) 10 MG tablet Take 1 tablet (10 mg total) by mouth every 6 (six) hours as needed (Nausea or vomiting). (Patient not taking: Reported on 11/13/2021) 30 tablet 6  ? senna-docusate (SENOKOT-S) 8.6-50 MG tablet Take 1 tablet by mouth at bedtime as needed for mild constipation. (Patient not taking: Reported on 11/13/2021)    ? Sodium Chloride-Sodium Bicarb (SODIUM BICARBONATE/SODIUM CHLORIDE) SOLN 1 application by Mouth Rinse route as needed for dry mouth. (Patient not taking: Reported on 11/13/2021)    ? Tetrahydrozoline HCl (VISINE OP) Place 1 drop into both eyes daily as needed (dry eyes).    ? ?No current facility-administered medications for this visit.  ? ? ?REVIEW OF SYSTEMS:   ?10 Point review of Systems  was done is negative except as noted above. ? ? ?PHYSICAL EXAMINATION: ?ECOG PERFORMANCE STATUS: 1 - Symptomatic but completely ambulatory ? ?Vitals:  ? 12/18/21 1227  ?BP: 98/72  ?Pulse: 65  ?Resp: 18  ?Temp: 97.8 ?F (36.6 ?C)  ?SpO2: 100%  ? ?Filed Weights  ? 12/18/21 1227  ?Weight: 192 lb 11.2 oz (87.4 kg)  ? ?.Body mass index is 24.74 kg/m?. ?.  NAD ?GENERAL:alert, in no acute distress and comfortable ?SKIN: no acute rashes, no significant lesions ?EYES: conjunctiva are pink and non-injected, sclera anicteric ?OROPHARYNX: MMM, no exudates, no oropharyngeal erythema or ulceration ?NECK: supple, no JVD ?LYMPH:  no palpable lymphadenopathy in the cervical, axillary or inguinal regions ?LUNGS: clear to auscultation b/l with normal respiratory effort ?HEART: regular rate & rhythm ?ABDOMEN:  normoactive bowel sounds , non tender, not distended. ?Extremity: no pedal edema ?PSYCH: alert & oriented x 3 with fluent speech ?NEURO: no focal motor/sensory deficits ? ?LABORATORY DATA:  ? ?I have reviewed the data as listed ? ? ?  Latest Ref Rng & Units 12/18/2021  ? 12:10 PM 11/27/2021  ?  2:38 PM 11/20/2021  ?  1:17 PM  ?CBC  ?WBC 4.0 - 10.5 K/uL  5.7   24.7   9.7    ?Hemoglobin 13.0 - 17.0 g/dL 11.4   10.5   10.7    ?Hematocrit 39.0 - 52.0 % 35.2   32.0   31.7    ?Platelets 150 - 400 K/uL 255   214   302    ? ? ?. ? ?  Latest Ref Rng & Units 12/18/2021

## 2021-12-21 ENCOUNTER — Telehealth: Payer: Self-pay | Admitting: Hematology

## 2021-12-21 NOTE — Telephone Encounter (Signed)
Left message with follow-up appointment per 5/8 los. ?

## 2021-12-24 ENCOUNTER — Encounter: Payer: Self-pay | Admitting: Hematology

## 2022-01-15 ENCOUNTER — Encounter: Payer: Self-pay | Admitting: Hematology

## 2022-01-15 ENCOUNTER — Telehealth: Payer: Self-pay

## 2022-01-15 NOTE — Telephone Encounter (Signed)
Returned call to pt regarding MyChart message. Assured pt it was ok to only flush port every 3 months. Pt verbalized understanding. Pt having mild GI symptoms and wanted to make sure we were aware. Pt to let us know if symptoms get worse or persist.

## 2022-01-17 ENCOUNTER — Other Ambulatory Visit: Payer: Self-pay | Admitting: Oncology

## 2022-02-02 ENCOUNTER — Other Ambulatory Visit: Payer: Self-pay | Admitting: Hematology

## 2022-02-02 DIAGNOSIS — C8338 Diffuse large B-cell lymphoma, lymph nodes of multiple sites: Secondary | ICD-10-CM

## 2022-02-07 ENCOUNTER — Encounter: Payer: Self-pay | Admitting: Gastroenterology

## 2022-02-07 ENCOUNTER — Ambulatory Visit (INDEPENDENT_AMBULATORY_CARE_PROVIDER_SITE_OTHER): Payer: No Typology Code available for payment source | Admitting: Gastroenterology

## 2022-02-07 DIAGNOSIS — R1013 Epigastric pain: Secondary | ICD-10-CM

## 2022-02-07 DIAGNOSIS — R1011 Right upper quadrant pain: Secondary | ICD-10-CM | POA: Diagnosis not present

## 2022-02-07 MED ORDER — CITRUCEL PO POWD: 1.0000 | Freq: Every day | ORAL | Status: DC

## 2022-02-07 NOTE — Patient Instructions (Signed)
If you are age 41 or younger, your body mass index should be between 19-25. Your Body mass index is 25.25 kg/m. If this is out of the aformentioned range listed, please consider follow up with your Primary Care Provider.  ________________________________________________________  The [facility] providers would like to encourage you to use [facility] to communicate with providers for non-urgent requests or questions.  Due to long hold times on the telephone, sending your provider a message by [facility] may be a faster and more efficient way to get a response.  Please allow 48 business hours for a response.  Please remember that this is for non-urgent requests.  _______________________________________________________  Allen Hodge have been scheduled for an endoscopy. Please follow written instructions given to you at your visit today. If you use inhalers (even only as needed), please bring them with you on the day of your procedure.  Due to recent changes in healthcare laws, you may see the results of your imaging and laboratory studies on MyChart before your provider has had a chance to review them.  We understand that in some cases there may be results that are confusing or concerning to you. Not all laboratory results come back in the same time frame and the provider may be waiting for multiple results in order to interpret others.  Please give [place] 48 hours in order for your provider to thoroughly review all the results before contacting the office for clarification of your results.   Please start taking citrucel (orange flavored) powder fiber supplement.  This may cause some bloating at first but that usually goes away. Begin with a small spoonful and work your way up to a large, heaping spoonful daily over a week.  Please take Protonix prior to dinner meal each night.  Thank you for entrusting me with your care and choosing [facility].  Dr Ardis Hughs

## 2022-02-07 NOTE — Progress Notes (Signed)
Review of pertinent gastrointestinal problems: 1.  Family history of colon cancer, his mother had colon cancer in her 11s. 2.  Precancerous colon polyps.  Colonoscopy 09/2018 found a single subcentimeter tubular adenoma.  Recall at 5-year interval given family history 3.  Extensive non-Hodgkin's lymphoma (stage IVa large cell B-cell) in abdomen chest and neck diagnosed when he presented with worsening abdominal pains in late 2022; He presented with extensive abdominal and retroperitoneal lymphadenopathy, right supraclavicular lymphadenopathy and lung nodules concerning for lymphoma involvement as well as concern for small bowel involvement.  HPI: This is a very pleasant 41 year old man whom I last saw late 2022 when he was originally diagnosed with non-Hodgkin's lymphoma  He underwent 6 treatments of chemotherapy, 4 rounds required inpatient admission.  He lost 25 to 30 pounds with this and has gained a lot of it back already.  PET scan 12/2021 showed no hypermetabolic activity in his body.  Blood work 12/2021 showed CBC was normal except for hemoglobin 11.5, complete metabolic profile was normal except for creatinine 1.26.   For 3 to 4 weeks he has had some upper abdominal discomforts and also some lower bowel issues.  He describes the upper issues as crampy, intermittent pains a lot of gassiness.  Eating tends to always help these discomforts.  He does not have nausea or vomiting.  He has not seen any overt GI bleeding.  Sometimes the pains are improved when he sits forward.  These are very unlike the pains that he was having when he was originally diagnosed with his lymphoma.  Those abdominal, full body pains completely improved simply with prednisone.  He has also noticed a general slowing of his bowels.  He used to go twice a day without any real efforts now he is going once a day and sometimes feels quite backed up.  He has given himself an enema couple times lately.  ROS: complete GI ROS as  described in HPI, all other review negative.  Constitutional:  No unintentional weight loss   Past Medical History:  Diagnosis Date   Allergy    Cancer ([place])    skin cancer   Herniated disc    Hx of skin cancer, basal cell    Hyperlipidemia    borderline- no meds     Past Surgical History:  Procedure Laterality Date   COLONOSCOPY     last 2013   ESOPHAGOGASTRODUODENOSCOPY (EGD) WITH PROPOFOL N/A 06/29/2021   Procedure: ESOPHAGOGASTRODUODENOSCOPY (EGD) WITH PROPOFOL;  Surgeon: Milus Banister, MD;  Location: [facility];  Service: Endoscopy;  Laterality: N/A;   EUS N/A 06/29/2021   Procedure: UPPER ENDOSCOPIC ULTRASOUND (EUS) RADIAL;  Surgeon: Milus Banister, MD;  Location: [facility];  Service: Endoscopy;  Laterality: N/A;   FINE NEEDLE ASPIRATION N/A 06/29/2021   Procedure: FINE NEEDLE ASPIRATION (FNA) LINEAR;  Surgeon: Milus Banister, MD;  [facility]: [facility];  Service: Endoscopy;  Laterality: N/A;   IR IMAGING GUIDED PORT INSERTION  07/26/2021   VASECTOMY      Current Outpatient Medications  Medication Instructions   acetaminophen (TYLENOL) 650 mg, Oral, Every 4 hours PRN   Ascorbic Acid (VITAMIN C PO) 1 tablet, Oral, 3 times weekly   b complex vitamins capsule 1 capsule, Oral, 3 times weekly   Cholecalciferol (VITAMIN D-3 PO) 1 tablet, Oral, 3 times weekly   Cyanocobalamin (B-12) 5000 MCG SUBL 1 tablet, Oral, 2 times daily   diphenhydrAMINE (BENADRYL) 25 mg, Oral, At bedtime PRN   hydroxypropyl methylcellulose / hypromellose (  ISOPTO TEARS / GONIOVISC) 2.5 % ophthalmic solution 1 drop, Both Eyes, 4 times daily PRN   ibuprofen (ADVIL) 400 mg, Every 6 hours PRN   lidocaine-prilocaine (EMLA) cream Apply to affected area once   loratadine (CLARITIN) 10 mg, Oral, Daily PRN   LORazepam (ATIVAN) 0.5 mg, Oral, Every 6 hours PRN   Naltrexone HCl, Pain, 4.5 MG CAPS 1 tablet, Oral, Nightly   ondansetron (ZOFRAN) 8 mg, Oral, 2 times daily PRN, Start on day 3 after  cyclophosphamide chemotherapy.   pantoprazole (PROTONIX) 40 mg, Oral, Daily before breakfast   polyethylene glycol (MIRALAX / GLYCOLAX) 17 g, Oral, Daily PRN   predniSONE (DELTASONE) 20 mg, Every morning   prochlorperazine (COMPAZINE) 10 mg, Oral, Every 6 hours PRN   senna-docusate (SENOKOT-S) 8.6-50 MG tablet 1 tablet, Oral, At bedtime PRN   Sodium Chloride-Sodium Bicarb (SODIUM BICARBONATE/SODIUM CHLORIDE) SOLN 1 application , Mouth Rinse, As needed   Tetrahydrozoline HCl (VISINE OP) 1 drop, Both Eyes, Daily PRN    Allergies as of 02/07/2022 - Review Complete 02/07/2022  Allergen Reaction Noted   Bee pollen-1000-royal jelly [nutritional supplements] Anaphylaxis 09/08/2018   Gluten meal Other (See Comments) 07/19/2021   Lactose intolerance (gi) Other (See Comments) 07/19/2021    Family History  Problem Relation Age of Onset   Colon cancer Mother 89   Colon polyps Mother    Colon cancer Paternal Grandfather    Alzheimer's disease Paternal Grandfather    Breast cancer Maternal Aunt    Diabetes Maternal Grandmother    Prostate cancer Maternal Grandfather    Esophageal cancer Neg Hx    Rectal cancer Neg Hx    Stomach cancer Neg Hx     Social History   Socioeconomic History   Marital status: Married    Spouse name: Not on file   Number of children: 1   Years of education: Not on file   Highest education level: Not on file  Occupational History   Occupation: Sales   Tobacco Use   Smoking status: Never   Smokeless tobacco: Never  Substance and Sexual Activity   Alcohol use: Not Currently    Alcohol/week: 1.0 standard drink of alcohol    Types: 1 Glasses of wine per week    Comment: Rare    Drug use: No   Sexual activity: Not on file  Other Topics Concern   Not on file  Social History Narrative   Daily caffeine    Social Determinants of Health   Financial Resource Strain: Not on file  Food Insecurity: Not on file  Transportation Needs: Not on file  Physical  Activity: Not on file  Stress: Not on file  Social Connections: Not on file  Intimate Partner Violence: Not on file     Physical Exam: Ht '6\' 1"'$  (1.854 m) Comment: height measured without shoes  Wt 191 lb 6 oz (86.8 kg)   BMI 25.25 kg/m  Constitutional: generally well-appearing Psychiatric: alert and oriented x3 Abdomen: soft, very mild epigastric abdominal tenderness nondistended, no obvious ascites, no peritoneal signs, normal bowel sounds No peripheral edema noted in lower extremities  Assessment and plan: 41 y.o. male with upper abdominal to right upper quadrant abdominal pains, change in bowel habits  This is all in the setting of recently diagnosed and treated diffuse stage IV non-Hodgkin's lymphoma.  PET scan last month was completely clear of any avid PET activity.  It seems unlikely that his symptoms are related to recurrent lymphoma.  He does not take NSAIDs  very frequently at all.  His symptoms are however somewhat similar to peptic ulcer disease.  I recommended EGD to be more certain and we will do that for him later this week.  In the meantime he will stay on his Protonix which she just restarted a couple days ago and has already noticed slight improvement.  He will take this 20 to 30 minutes prior to his dinner meal on a daily basis.  As far as his bowel changes, given his lymphoma and extensive chemotherapy treatments for it I do not think these bowel changes are very surprising.  I recommended he start fiber supplement for now.  He was scheduled for a CT scan abdomen pelvis with later this week, we are going to cancel this for now but I might reorder it depending on how he does clinically and the results of his upper endoscopy.  Please see the "Patient Instructions" section for addition details about the plan.  Owens Loffler, MD [facility] 02/07/2022, 9:13 AM   Total time on date of encounter was 35 minutes (this included time spent preparing to see  the patient reviewing records; obtaining and/or reviewing separately obtained history; performing a medically appropriate exam and/or evaluation; counseling and educating the patient and family if present; ordering medications, tests or procedures if applicable; and documenting clinical information in the health record).

## 2022-02-09 ENCOUNTER — Ambulatory Visit (AMBULATORY_SURGERY_CENTER): Payer: No Typology Code available for payment source | Admitting: Gastroenterology

## 2022-02-09 ENCOUNTER — Encounter (HOSPITAL_COMMUNITY): Payer: Self-pay

## 2022-02-09 ENCOUNTER — Ambulatory Visit (HOSPITAL_COMMUNITY): Payer: No Typology Code available for payment source

## 2022-02-09 ENCOUNTER — Encounter: Payer: Self-pay | Admitting: Gastroenterology

## 2022-02-09 VITALS — BP 103/67 | HR 75 | Temp 97.8°F | Resp 13 | Ht 73.0 in | Wt 191.0 lb

## 2022-02-09 DIAGNOSIS — R1013 Epigastric pain: Secondary | ICD-10-CM

## 2022-02-09 MED ORDER — SODIUM CHLORIDE 0.9 % IV SOLN: 500.0000 mL | Freq: Once | INTRAVENOUS | Status: DC

## 2022-02-09 NOTE — Progress Notes (Signed)
Report to PACU, RN, vss, BBS= Clear.  

## 2022-02-09 NOTE — Progress Notes (Signed)
Called to room to assist during endoscopic procedure.  Patient ID and intended procedure confirmed with present staff. Received instructions for my participation in the procedure from the performing physician.  

## 2022-02-09 NOTE — Patient Instructions (Signed)
YOU HAD AN ENDOSCOPIC PROCEDURE TODAY AT THE [facility]:   Refer to the procedure report that was given to you for any specific questions about what was found during the examination.  If the procedure report does not answer your questions, please call your gastroenterologist to clarify.  If you requested that your care partner not be given the details of your procedure findings, then the procedure report has been included in a sealed envelope for you to review at your convenience later.  YOU SHOULD EXPECT: Some feelings of bloating in the abdomen. Passage of more gas than usual.  Walking can help get rid of the air that was put into your GI tract during the procedure and reduce the bloating. If you had a lower endoscopy (such as a colonoscopy or flexible sigmoidoscopy) you may notice spotting of blood in your stool or on the toilet paper. If you underwent a bowel prep for your procedure, you may not have a normal bowel movement for a few days.  Please Note:  You might notice some irritation and congestion in your nose or some drainage.  This is from the oxygen used during your procedure.  There is no need for concern and it should clear up in a day or so.  SYMPTOMS TO REPORT IMMEDIATELY:  Following upper endoscopy (EGD)  Vomiting of blood or coffee ground material  New chest pain or pain under the shoulder blades  Painful or persistently difficult swallowing  New shortness of breath  Fever of 100F or higher  Black, tarry-looking stools  For urgent or emergent issues, a gastroenterologist can be reached at any hour by calling [phone]. Do not use MyChart messaging for urgent concerns.    DIET:  We do recommend a small meal at first, but then you may proceed to your regular diet.  Drink plenty of fluids but you should avoid alcoholic beverages for 24 hours.  ACTIVITY:  You should plan to take it easy for the rest of today and you should NOT DRIVE or use heavy machinery until  tomorrow (because of the sedation medicines used during the test).    FOLLOW UP: Our staff will call the number listed on your records the next business day following your procedure.  We will call around 7:15- 8:00 am to check on you and address any questions or concerns that you may have regarding the information given to you following your procedure. If we do not reach you, we will leave a message.  If you develop any symptoms (ie: fever, flu-like symptoms, shortness of breath, cough etc.) before then, please call [phone].  If you test positive for Covid 19 in the 2 weeks post procedure, please call and report this information to us.    If any biopsies were taken you will be contacted by phone or by letter within the next 1-3 weeks.  Please call us at [phone] if you have not heard about the biopsies in 3 weeks.    SIGNATURES/CONFIDENTIALITY: You and/or your care partner have signed paperwork which will be entered into your electronic medical record.  These signatures attest to the fact that that the information above on your After Visit Summary has been reviewed and is understood.  Full responsibility of the confidentiality of this discharge information lies with you and/or your care-partner.  

## 2022-02-09 NOTE — Progress Notes (Signed)
  The recent H&P (dated in past 10 days) was reviewed, the patient was examined and there is no change in the patients condition since that H&P was completed.   Allen Hodge  02/09/2022, 7:41 AM

## 2022-02-09 NOTE — Progress Notes (Signed)
Pt's states no medical or surgical changes since previsit or office visit. VS assessed by D.T 

## 2022-02-09 NOTE — Op Note (Signed)
[facility] Patient Name: Allen Hodge Procedure Date: 02/09/2022 8:16 AM MRN: [id number] Endoscopist: Milus Banister , MD Age: 41 Referring MD:  Date of Birth: April 29, 1981 Gender: Male Account #: [id number] Procedure:                Upper GI endoscopy Indications:              Epigastric abdominal pain Medicines:                Monitored Anesthesia Care Procedure:                Pre-Anesthesia Assessment:                           - Prior to the procedure, a History and Physical                            was performed, and patient medications and                            allergies were reviewed. The patient's tolerance of                            previous anesthesia was also reviewed. The risks                            and benefits of the procedure and the sedation                            options and risks were discussed with the patient.                            All questions were answered, and informed consent                            was obtained. Prior Anticoagulants: The patient has                            taken no previous anticoagulant or antiplatelet                            agents. ASA Grade Assessment: II - A patient with                            mild systemic disease. After reviewing the risks                            and benefits, the patient was deemed in                            satisfactory condition to undergo the procedure.                           After obtaining informed consent, the endoscope was  passed under direct vision. Throughout the                            procedure, the patient's blood pressure, pulse, and                            oxygen saturations were monitored continuously. The                            GIF [id number] #[id number] was introduced through the                            mouth, and advanced to the second part of duodenum.                            The upper GI endoscopy was  accomplished without                            difficulty. The patient tolerated the procedure                            well. Scope In: Scope Out: Findings:                 The esophagus was normal.                           The stomach was normal.                           The examined duodenum was normal.                           Biopsies were taken from the gastric antrum and                            body with a cold forceps for histology.                           The exam was otherwise without abnormality. Complications:            No immediate complications. Estimated blood loss:                            None. Estimated Blood Loss:     Estimated blood loss: none. Impression:               - Normal UGI tract.                           - Biopsies were taken from the gastric antrum and                            body with a cold forceps for histology. Recommendation:           - Patient has a contact number available for  emergencies. The signs and symptoms of potential                            delayed complications were discussed with the                            patient. Return to normal activities tomorrow.                            Written discharge instructions were provided to the                            patient.                           - Resume previous diet.                           - Continue present medications.                           - Await pathology results. Milus Banister, MD 02/09/2022 8:29:56 AM This report has been signed electronically.

## 2022-02-12 ENCOUNTER — Telehealth: Payer: Self-pay | Admitting: *Deleted

## 2022-02-12 NOTE — Telephone Encounter (Signed)
  Follow up Call-     02/09/2022    7:37 AM  Call back number  Post procedure Call Back phone  # [phone]  Permission to leave phone message Yes     Patient questions:  Do you have a fever, pain , or abdominal swelling? No. Pain Score  0 *  Have you tolerated food without any problems? Yes.    Have you been able to return to your normal activities? Yes.    Do you have any questions about your discharge instructions: Diet   No. Medications  No. Follow up visit  No.  Do you have questions or concerns about your Care? Yes  Patient wants to know about his results. Pt informed that results take 1-2 weeks to come back. Patient wanted to be put on ABX today. Informed pt that he would not be put on ABX until results came back.   Actions: * If pain score is 4 or above: No action needed, pain <4.

## 2022-02-15 ENCOUNTER — Telehealth: Payer: Self-pay | Admitting: Gastroenterology

## 2022-02-15 DIAGNOSIS — R1011 Right upper quadrant pain: Secondary | ICD-10-CM

## 2022-02-15 NOTE — Telephone Encounter (Signed)
The pt had EGD on 6/30 we are waiting on path results.  He is calling to report worse (7/10)  RUQ pain despite protonix 20-30 min prior to dinner meal.  He did take citrucel for about a week but says the pain was worse.  Path not available as of this morning.  He would like to know what else he can do in the meantime.

## 2022-02-15 NOTE — Telephone Encounter (Signed)
I am not able to send the vicodin.  The pt says he does not want to take the vicodin.  He is okay with tylenol for now.    I have ordered the CT.

## 2022-02-17 ENCOUNTER — Ambulatory Visit (HOSPITAL_BASED_OUTPATIENT_CLINIC_OR_DEPARTMENT_OTHER)
Admission: RE | Admit: 2022-02-17 | Discharge: 2022-02-17 | Disposition: A | Discharge status: Home / Self Care | Payer: No Typology Code available for payment source | Source: Ambulatory Visit | Attending: Gastroenterology | Admitting: Gastroenterology

## 2022-02-17 ENCOUNTER — Encounter (HOSPITAL_COMMUNITY): Payer: Self-pay | Admitting: Orthopedic Surgery

## 2022-02-17 DIAGNOSIS — R1011 Right upper quadrant pain: Secondary | ICD-10-CM | POA: Insufficient documentation

## 2022-02-17 MED ORDER — IOHEXOL 300 MG/ML  SOLN
100.0000 mL | Freq: Once | INTRAMUSCULAR | Status: AC | PRN
  Administered 2022-02-17: 80 mL via INTRAVENOUS

## 2022-02-17 MED ORDER — HEPARIN SOD (PORK) LOCK FLUSH 100 UNIT/ML IV SOLN: 500.0000 [IU] | Freq: Once | INTRAVENOUS | Status: DC

## 2022-02-17 MED ORDER — SODIUM CHLORIDE 0.9% FLUSH
10.0000 mL | INTRAVENOUS | Status: DC | PRN
  Filled 2022-02-17: qty 10

## 2022-02-19 ENCOUNTER — Other Ambulatory Visit: Payer: Self-pay

## 2022-02-19 DIAGNOSIS — C8338 Diffuse large B-cell lymphoma, lymph nodes of multiple sites: Secondary | ICD-10-CM

## 2022-02-19 NOTE — Progress Notes (Signed)
Contacted pt regarding MyChart message. Information reviewed with Dr Lorenso Courier (covering for Dr Irene Limbo) . Pt to come in this weeks for labs and MD/PA visit to discuss next steps. Pt agreed and verbalized understanding. Pt encouraged to use pain medicine as needed.

## 2022-02-20 ENCOUNTER — Other Ambulatory Visit: Payer: Self-pay | Admitting: Physician Assistant

## 2022-02-20 DIAGNOSIS — C8338 Diffuse large B-cell lymphoma, lymph nodes of multiple sites: Secondary | ICD-10-CM

## 2022-02-21 ENCOUNTER — Inpatient Hospital Stay: Payer: No Typology Code available for payment source | Attending: Hematology

## 2022-02-21 ENCOUNTER — Other Ambulatory Visit: Payer: Self-pay

## 2022-02-21 DIAGNOSIS — Z5189 Encounter for other specified aftercare: Secondary | ICD-10-CM | POA: Diagnosis not present

## 2022-02-21 DIAGNOSIS — Z79899 Other long term (current) drug therapy: Secondary | ICD-10-CM | POA: Diagnosis not present

## 2022-02-21 DIAGNOSIS — R591 Generalized enlarged lymph nodes: Secondary | ICD-10-CM | POA: Insufficient documentation

## 2022-02-21 DIAGNOSIS — C8511 Unspecified B-cell lymphoma, lymph nodes of head, face, and neck: Secondary | ICD-10-CM | POA: Insufficient documentation

## 2022-02-21 DIAGNOSIS — C8338 Diffuse large B-cell lymphoma, lymph nodes of multiple sites: Secondary | ICD-10-CM

## 2022-02-21 DIAGNOSIS — Z95828 Presence of other vascular implants and grafts: Secondary | ICD-10-CM

## 2022-02-21 DIAGNOSIS — R109 Unspecified abdominal pain: Secondary | ICD-10-CM | POA: Diagnosis not present

## 2022-02-21 DIAGNOSIS — Z7189 Other specified counseling: Secondary | ICD-10-CM

## 2022-02-21 LAB — LACTATE DEHYDROGENASE: LDH: 330 U/L — ABNORMAL HIGH (ref 98–192)

## 2022-02-21 LAB — CBC WITH DIFFERENTIAL (CANCER CENTER ONLY)
Abs Immature Granulocytes: 0.01 10*3/uL (ref 0.00–0.07)
Basophils Absolute: 0 10*3/uL (ref 0.0–0.1)
Basophils Relative: 1 %
Eosinophils Absolute: 0.1 10*3/uL (ref 0.0–0.5)
Eosinophils Relative: 1 %
HCT: 38.2 % — ABNORMAL LOW (ref 39.0–52.0)
Hemoglobin: 13.1 g/dL (ref 13.0–17.0)
Immature Granulocytes: 0 %
Lymphocytes Relative: 26 %
Lymphs Abs: 1.8 10*3/uL (ref 0.7–4.0)
MCH: 29.2 pg (ref 26.0–34.0)
MCHC: 34.3 g/dL (ref 30.0–36.0)
MCV: 85.3 fL (ref 80.0–100.0)
Monocytes Absolute: 0.6 10*3/uL (ref 0.1–1.0)
Monocytes Relative: 9 %
Neutro Abs: 4.2 10*3/uL (ref 1.7–7.7)
Neutrophils Relative %: 63 %
Platelet Count: 382 10*3/uL (ref 150–400)
RBC: 4.48 MIL/uL (ref 4.22–5.81)
RDW: 12.2 % (ref 11.5–15.5)
WBC Count: 6.7 10*3/uL (ref 4.0–10.5)
nRBC: 0 % (ref 0.0–0.2)

## 2022-02-21 LAB — CMP (CANCER CENTER ONLY)
ALT: 8 U/L (ref 0–44)
AST: 10 U/L — ABNORMAL LOW (ref 15–41)
Albumin: 4.2 g/dL (ref 3.5–5.0)
Alkaline Phosphatase: 49 U/L (ref 38–126)
Anion gap: 7 (ref 5–15)
BUN: 15 mg/dL (ref 6–20)
CO2: 28 mmol/L (ref 22–32)
Calcium: 9.9 mg/dL (ref 8.9–10.3)
Chloride: 103 mmol/L (ref 98–111)
Creatinine: 0.84 mg/dL (ref 0.61–1.24)
GFR, Estimated: >60 mL/min (ref >60)
Glucose, Bld: 109 mg/dL — ABNORMAL HIGH (ref 70–99)
Potassium: 3.6 mmol/L (ref 3.5–5.1)
Sodium: 138 mmol/L (ref 135–145)
Total Bilirubin: 0.3 mg/dL (ref 0.3–1.2)
Total Protein: 7 g/dL (ref 6.5–8.1)

## 2022-02-21 MED ORDER — SODIUM CHLORIDE 0.9% FLUSH
10.0000 mL | Freq: Once | INTRAVENOUS | Status: AC
  Administered 2022-02-21: 10 mL

## 2022-02-21 MED ORDER — HEPARIN SOD (PORK) LOCK FLUSH 100 UNIT/ML IV SOLN
500.0000 [IU] | Freq: Once | INTRAVENOUS | Status: AC
  Administered 2022-02-21: 500 [IU]

## 2022-02-22 ENCOUNTER — Inpatient Hospital Stay (HOSPITAL_BASED_OUTPATIENT_CLINIC_OR_DEPARTMENT_OTHER): Payer: No Typology Code available for payment source | Admitting: Physician Assistant

## 2022-02-22 ENCOUNTER — Other Ambulatory Visit: Payer: Self-pay

## 2022-02-22 ENCOUNTER — Encounter (HOSPITAL_COMMUNITY)
Admission: RE | Admit: 2022-02-22 | Discharge: 2022-02-22 | Disposition: A | Discharge status: Home / Self Care | Payer: No Typology Code available for payment source | Source: Ambulatory Visit | Attending: Physician Assistant | Admitting: Physician Assistant

## 2022-02-22 ENCOUNTER — Telehealth: Payer: Self-pay | Admitting: *Deleted

## 2022-02-22 VITALS — BP 133/96 | HR 72 | Temp 97.3°F | Resp 15 | Wt 183.4 lb

## 2022-02-22 DIAGNOSIS — C8338 Diffuse large B-cell lymphoma, lymph nodes of multiple sites: Secondary | ICD-10-CM

## 2022-02-22 LAB — GLUCOSE, CAPILLARY: Glucose-Capillary: 98 mg/dL (ref 70–99)

## 2022-02-22 MED ORDER — FLUDEOXYGLUCOSE F - 18 (FDG) INJECTION
9.6000 | Freq: Once | INTRAVENOUS | Status: AC
  Administered 2022-02-22: 9.5 via INTRAVENOUS

## 2022-02-22 MED ORDER — PREDNISONE 20 MG PO TABS: 20.0000 mg | ORAL_TABLET | ORAL | 0 refills | Status: DC | PRN

## 2022-02-22 NOTE — Telephone Encounter (Signed)
[facility] Claim paperwork received by this nurse today for Allen Hodge [phone]) with handwritten note to send medical records and pathology. Message left requesting return call with the following advice. Records may be obtained through MyChart patient portal.   [facility] Authorization for Use/Disclosure is required to process form and H.I.M.(SW) to submit records.   [place] assist form staff complete process and know disposition of form and records for benefit coordinator and patient.   Request patients allow up to 7-10 business days (14 calendar) to process upon form staff receipt of above.

## 2022-02-22 NOTE — Progress Notes (Unsigned)
Allen Edouard, MD  Donita Brooks D [place] for CT guided peritoneal vs RP LN biopsy.   GY

## 2022-02-22 NOTE — Progress Notes (Signed)
HEMATOLOGY/ONCOLOGY CLINIC NOTE  Date of Service: .02/22/2022  Patient Care Team: [name], MD as PCP - General (Family Medicine)  CHIEF COMPLAINTS/PURPOSE OF CONSULTATION:  Diffuse Large B-cell Lymphoma  INTERVAL HISTORY [name] is here for follow-up of his large B-cell lymphoma. He was last seen by Dr. Sherby Moncayo Hodge on 12/18/2021. In the interim, [name] was evaluated by GI due to abdominal pain and lower bowel issues. He underwent EGD on 02/09/2022 that was unremarkable. CT scan of the abdomen/pelvis was obtained on 02/17/2022 that unfortunately showed development of bulky lymphadenopathy in the abdomen consistent with recurrent lymphoma.   On exam today, [name] reports having ongoing mid abdominal pain that radiates to his back. He is currently finding pain relief with taking prednisone 20 mg sparingly, at the most twice a day. With better pain control, he is able to eat more and have more energy. He denies nausea or vomiting. He denies diarrhea or constipation. He denies fevers, chills, sweats, shortness of breath, chest pain, cough, bruising/bleeding, headaches, dizziness or syncopal episodes. He has no other complaints.   MEDICAL HISTORY:  Past Medical History:  Diagnosis Date   Allergy    Cancer ([place])    skin cancer   Herniated disc    Hx of skin cancer, basal cell    Hyperlipidemia    borderline- no meds    Non Hodgkin's lymphoma ([place])     SURGICAL HISTORY: Past Surgical History:  Procedure Laterality Date   COLONOSCOPY     last 2013   ESOPHAGOGASTRODUODENOSCOPY (EGD) WITH PROPOFOL N/A 06/29/2021   Procedure: ESOPHAGOGASTRODUODENOSCOPY (EGD) WITH PROPOFOL;  Surgeon: Milus Banister, MD;  Location: [facility];  Service: Endoscopy;  Laterality: N/A;   EUS N/A 06/29/2021   Procedure: UPPER ENDOSCOPIC ULTRASOUND (EUS) RADIAL;  Surgeon: Milus Banister, MD;  Location: [facility];  Service: Endoscopy;  Laterality: N/A;   FINE NEEDLE  ASPIRATION N/A 06/29/2021   Procedure: FINE NEEDLE ASPIRATION (FNA) LINEAR;  Surgeon: Milus Banister, MD;  Location: [facility];  Service: Endoscopy;  Laterality: N/A;   IR IMAGING GUIDED PORT INSERTION  07/26/2021   VASECTOMY      SOCIAL HISTORY: Social History   Socioeconomic History   Marital status: Married    Spouse name: Not on file   Number of children: 1   Years of education: Not on file   Highest education level: Not on file  Occupational History   Occupation: Sales   Tobacco Use   Smoking status: Never   Smokeless tobacco: Never  Substance and Sexual Activity   Alcohol use: Not Currently    Alcohol/week: 1.0 standard drink of alcohol    Types: 1 Glasses of wine per week    Comment: Rare    Drug use: No   Sexual activity: Not on file  Other Topics Concern   Not on file  Social History Narrative   Daily caffeine    Social Determinants of Health   Financial Resource Strain: Not on file  Food Insecurity: Not on file  Transportation Needs: Not on file  Physical Activity: Not on file  Stress: Not on file  Social Connections: Not on file  Intimate Partner Violence: Not on file    FAMILY HISTORY: Family History  Problem Relation Age of Onset   Colon cancer Mother 86   Colon polyps Mother    Colon cancer Paternal Grandfather    Alzheimer's disease Paternal Grandfather    Breast cancer Maternal Aunt  Diabetes Maternal Grandmother    Prostate cancer Maternal Grandfather    Esophageal cancer Neg Hx    Rectal cancer Neg Hx    Stomach cancer Neg Hx     ALLERGIES:  is allergic to bee pollen-1000-royal jelly [nutritional supplements], gluten meal, and lactose intolerance (gi).  MEDICATIONS:  Current Outpatient Medications  Medication Sig Dispense Refill   acetaminophen (TYLENOL) 325 MG tablet Take 2 tablets (650 mg total) by mouth every 4 (four) hours as needed for mild pain.     Ascorbic Acid (VITAMIN C PO) Take 1 tablet by mouth 3 (three) times a  week.     b complex vitamins capsule Take 1 capsule by mouth 3 (three) times a week.     Cholecalciferol (VITAMIN D-3 PO) Take 1 tablet by mouth 3 (three) times a week.     Cyanocobalamin (B-12) 5000 MCG SUBL Take 1 tablet by mouth 2 (two) times daily.     diphenhydrAMINE (BENADRYL) 25 MG tablet Take 25 mg by mouth at bedtime as needed (seasonal allergies).     ibuprofen (ADVIL) 200 MG tablet Take 400 mg by mouth every 6 (six) hours as needed (pain).     lidocaine-prilocaine (EMLA) cream Apply to affected area once (Patient taking differently: Apply 1 application  topically daily as needed (prior to port access).) 30 g 3   loratadine (CLARITIN) 10 MG tablet Take 10 mg by mouth daily as needed (seasonal allergies).     LORazepam (ATIVAN) 0.5 MG tablet Take 1 tablet (0.5 mg total) by mouth every 6 (six) hours as needed (Nausea or vomiting). 30 tablet 0   methylcellulose (CITRUCEL) oral powder Take 1 packet by mouth daily.     Naltrexone HCl, Pain, 4.5 MG CAPS Take 1 tablet by mouth at bedtime.     ondansetron (ZOFRAN) 8 MG tablet Take 1 tablet (8 mg total) by mouth 2 (two) times daily as needed for refractory nausea / vomiting. Start on day 3 after cyclophosphamide chemotherapy. 30 tablet 1   pantoprazole (PROTONIX) 40 MG tablet Take 1 tablet (40 mg total) by mouth daily before breakfast. (Patient taking differently: Take 40 mg by mouth daily as needed (heartburn).) 30 tablet 2   polyethylene glycol (MIRALAX / GLYCOLAX) 17 g packet Take 17 g by mouth daily as needed (constipation).     senna-docusate (SENOKOT-S) 8.6-50 MG tablet Take 1 tablet by mouth at bedtime as needed for mild constipation.     Tetrahydrozoline HCl (VISINE OP) Place 1 drop into both eyes daily as needed (dry eyes).     predniSONE (DELTASONE) 20 MG tablet Take 1 tablet (20 mg total) by mouth as needed. 60 tablet 0   No current facility-administered medications for this visit.    REVIEW OF SYSTEMS:   10 Point review of Systems  was done is negative except as noted above.   PHYSICAL EXAMINATION: ECOG PERFORMANCE STATUS: 1 - Symptomatic but completely ambulatory  Vitals:   02/22/22 1029  BP: (!) 133/96  Pulse: 72  Resp: 15  Temp: (!) 97.3 F (36.3 C)  SpO2: 100%   Filed Weights   02/22/22 1029  Weight: 183 lb 6.4 oz (83.2 kg)   .Body mass index is 24.2 kg/m. [name]  NAD GENERAL:alert, in no acute distress and comfortable SKIN: no acute rashes, no significant lesions EYES: conjunctiva are pink and non-injected, sclera anicteric OROPHARYNX: MMM, no exudates, no oropharyngeal erythema or ulceration NECK: supple, no JVD LYMPH:  no palpable lymphadenopathy in the cervical, axillary regions  LUNGS: clear to auscultation b/l with normal respiratory effort HEART: regular rate & rhythm ABDOMEN:  normoactive bowel sounds, not distended. Tenderness to palpation in mid/epigastric region. No hepatosplenomegaly.  Extremity: no pedal edema PSYCH: alert & oriented x 3 with fluent speech NEURO: no focal motor/sensory deficits  LABORATORY DATA:   I have reviewed the data as listed     Latest Ref Rng & Units 02/21/2022    9:49 AM 12/18/2021   12:10 PM 11/27/2021    2:38 PM  CBC  WBC 4.0 - 10.5 K/uL 6.7  5.7  24.7   Hemoglobin 13.0 - 17.0 g/dL 13.1  11.4  10.5   Hematocrit 39.0 - 52.0 % 38.2  35.2  32.0   Platelets 150 - 400 K/uL 382  255  214     .    Latest Ref Rng & Units 02/21/2022    9:49 AM 12/18/2021   12:10 PM 11/27/2021    2:38 PM  CMP  Glucose 70 - 99 mg/dL 109  96  103   BUN 6 - 20 mg/dL '15  12  10   '$ Creatinine 0.61 - 1.24 mg/dL 0.84  1.26  1.28   Sodium 135 - 145 mmol/L 138  139  137   Potassium 3.5 - 5.1 mmol/L 3.6  4.1  3.8   Chloride 98 - 111 mmol/L 103  106  102   CO2 22 - 32 mmol/L '28  29  28   '$ Calcium 8.9 - 10.3 mg/dL 9.9  9.4  9.4   Total Protein 6.5 - 8.1 g/dL 7.0  6.6  6.7   Total Bilirubin 0.3 - 1.2 mg/dL 0.3  0.3  0.2   Alkaline Phos 38 - 126 U/L 49  46  73   AST 15 - 41 U/L '10  13   14   '$ ALT 0 - 44 U/L '8  9  15    '$ . Lab Results  Component Value Date   LDH 330 (H) 02/21/2022   SURGICAL PATHOLOGY  CASE: [id number]  PATIENT: Allen Hodge  Surgical Pathology Report    Specimen Submitted:  A. Lymph node, right neck   Clinical History: History of malignant non Hodgkin's lymphoma after EUS  biopsy on 11/17, however sample was insufficient to subclassify for  treatment planning.  Now post US guided bx of hypermetabolic right  supraclavicular lymph node       DIAGNOSIS:  A. LYMPH NODE, RIGHT NECK; ULTRASOUND-GUIDED BIOPSY:  - LARGE B-CELL LYMPHOMA.  - SEE COMMENT.   Comment:  Sections demonstrate sheets of large, markedly atypical cells with  prominent macronucleoli.  Mitotic activity is brisk.  Flow cytometry  demonstrates a CD5 negative, CD10 negative monoclonal B cell population.  Definitive classification is dependent on prognostically significant IHC  and FISH studies, which will be reported in an addendum.      RADIOGRAPHIC STUDIES: I have personally reviewed the radiological images as listed and agreed with the findings in the report. NM PET Image Restag (PS) Skull Base To Thigh  Result Date: 02/22/2022 CLINICAL DATA:  Subsequent treatment strategy for diffuse large B-cell lymphoma. EXAM: NUCLEAR MEDICINE PET SKULL BASE TO THIGH TECHNIQUE: 9.5 mCi F-18 FDG was injected intravenously. Full-ring PET imaging was performed from the skull base to thigh after the radiotracer. CT data was obtained and used for attenuation correction and anatomic localization. Fasting blood glucose: 98 mg/dl COMPARISON:  Prior PET CTs. The most recent is 12/14/2021. Recent CT scan 02/17/2022. FINDINGS: Mediastinal blood pool activity:  SUV max 2.36 Liver activity: SUV max 3.26 NECK: No hypermetabolic lymph nodes in the neck. Incidental CT findings: none CHEST: No hypermetabolic mediastinal or hilar nodes. No suspicious pulmonary nodules on the CT scan. Incidental CT  findings: The left-sided Port-A-Cath is stable. Sherlon Handing and right subclavian artery again noted. No acute pulmonary findings. No supraclavicular or axillary adenopathy. ABDOMEN/PELVIS: As seen on the recent CT scan there is extensive recurrent bulky mesenteric and retroperitoneal lymphadenopathy. The gastrohepatic and celiac axis lymphadenopathy has an SUV max of 7.73. Hepatic duodenal adenopathy has an SUV max of 7.70. Mesenteric adenopathy has an SUV max of 10.93. Left para-aortic adenopathy has an SUV max of 8.65. No pelvic or inguinal lymphadenopathy. No splenomegaly or splenic lesions to suggest splenic involvement. No hypermetabolic lesions involving the liver or adrenal glands. Incidental CT findings: none SKELETON: No hypermetabolic bone lesions to suggest lymphomatous involvement. Incidental CT findings: none IMPRESSION: Rapid progression of extensive hypermetabolic recurrent abdominal lymphoma (Deauville 5). No evidence of lymphoma involving the neck, chest, pelvis or osseous structures. Electronically Signed   By: Marijo Sanes M.D.   On: 02/22/2022 11:05   CT ABDOMEN PELVIS W CONTRAST  Result Date: 02/17/2022 CLINICAL DATA:  Right upper quadrant pain. Recent chemotherapy for non-Hodgkin's lymphoma. * Tracking Code: BO * EXAM: CT ABDOMEN AND PELVIS WITH CONTRAST TECHNIQUE: Multidetector CT imaging of the abdomen and pelvis was performed using the standard protocol following bolus administration of intravenous contrast. RADIATION DOSE REDUCTION: This exam was performed according to the departmental dose-optimization program which includes automated exposure control, adjustment of the mA and/or kV according to patient size and/or use of iterative reconstruction technique. CONTRAST:  18m OMNIPAQUE IOHEXOL 300 MG/ML  SOLN COMPARISON:  PET-CT 12/14/2021 FINDINGS: Lower chest: Unremarkable Hepatobiliary: No suspicious focal abnormality within the liver parenchyma. There is no evidence for gallstones,  gallbladder wall thickening, or pericholecystic fluid. No intrahepatic or extrahepatic biliary dilation. Pancreas: No focal mass lesion. No dilatation of the main duct. No intraparenchymal cyst. No peripancreatic edema. Spleen: No splenomegaly. No focal mass lesion. Adrenals/Urinary Tract: No adrenal nodule or mass. Kidneys unremarkable. No evidence for hydroureter. The urinary bladder appears normal for the degree of distention. Stomach/Bowel: Stomach is unremarkable. No gastric wall thickening. No evidence of outlet obstruction. Duodenum is normally positioned as is the ligament of Treitz. No small bowel wall thickening. No small bowel dilatation. The terminal ileum is normal. The appendix is not well visualized, but there is no edema or inflammation in the region of the cecum. No gross colonic mass. No colonic wall thickening. Vascular/Lymphatic: No abdominal aortic aneurysm. Right retrocrural nodule on image 15/2 is probably the cisterna chyli. Interval development of bulky gastrohepatic ligament, hepatoduodenal ligament, central mesenteric and retroperitoneal lymphadenopathy. 4.0 x 3.3 cm hepatoduodenal ligament lymph node is seen on image 18/series 2. 2.3 cm short axis left para-aortic node seen on image 21/2. 2.5 cm aortocaval short axis lymph node seen on 27/2. 4.5 cm short axis right paramidline mesenteric node visible on 41/2. Clustered upper normal lymph nodes are seen in the right lower quadrant ileocolic mesentery. The bulky central abdominal lymphadenopathy generates mass-effect on the portal vein, branches of the superior mesenteric vein, and the splenic vein although all of these vascular structures remain patent. Celiac axis, common hepatic artery, splenic artery, and SMA are patent. No pelvic sidewall lymphadenopathy. Reproductive: The prostate gland and seminal vesicles are unremarkable. Other: No intraperitoneal free fluid. Musculoskeletal: No worrisome lytic or sclerotic osseous abnormality.  IMPRESSION: 1. Since PET-CT of 12/14/2021,  there has been development of bulky lymphadenopathy in the abdomen consistent with recurrent lymphoma. 2. No other acute findings in the abdomen or pelvis. Electronically Signed   By: Misty Stanley M.D.   On: 02/17/2022 10:53    ASSESSMENT & PLAN:  Allen Hodge is a 41 y.o. male who presents to the clinic for follow up of diffuse large B-cell lymphoma.   1) Stage IVA large B-cell lymphoma NOS.   --Double expressor ABC type non-germinal center (activated B cell) immunophenotype. Co-expression of BCL2 and C-MYC by IHC may be associated with a more aggressive clinical course.  --Immunohistochemical studies demonstrated the tumor cells to be positive for CD20, BCL2, BCL6, MUM1, and C-MYC, and negative for CD5, CD10, CD30, and EBER ISH. Non-germinal center (activated B cell) immunophenotype. Co-expression of BCL2 and C-MYC by IHC may be associated with a more aggressive clinical course.  --Lymphoma FISH panel shows no evidence of c-Myc rearrangement ruling out double hit or triple hit lymphoma. --He initially received 2 cycles of R-CHOP but switched to EPOCH-R beginning with cycle #3 due to be more aggressive with treatment. 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  --Post treatment PET scan from 12/14/2021 showed complete response to treatment with no residual hypermetabolic disease.  -Patient presents today for a follow up after recent CT imaging from 02/17/2022 showed development of bulky lymphadenopathy in the abdomen consistent with recurrent lymphoma. He underwent PET imaging today that showed extensive hypermetabolic recurrent abdominal lymphoma, Deauville 5.  -We will request a STAT core needle biopsy of one of the target lymph nodes to confirm recurrence. Additionally, we will send a referral to Dr. Alver Sorrow at [facility] to evaluate for CAR-T cell therapy or  clinical trial opportunities.   #Abdominal pain: --Secondary to bulky lymphadenopathy --Currently taking prednisone 20 mg up to twice a day as needed. Sent refill.  --Advised to take sparingly to prevent false-negative results.   Follow up: --We will follow up with patient once he undergoes consultation with [place].    All of the patient's questions were answered with apparent satisfaction. The patient knows to call the clinic with any problems, questions or concerns.  I have spent a total of 40 minutes minutes of face-to-face and non-face-to-face time, preparing to see the patient, performing a medically appropriate examination, counseling and educating the patient, ordering medications/tests, referring and communicating with other health care professionals, documenting clinical information in the electronic health record,  and care coordination.   Dede Query PA-C Dept of Hematology and [place] at [facility] Phone: [phone]  Patient was seen with Dr. Lorenso Hodge  I have read the above note and personally examined the patient. I agree with the assessment and plan as noted above.  Briefly [name] is a 41 year old male who is a patient of Dr. Rylin Seavey Hodge who recently completed treatment for diffuse large B-cell lymphoma in April 2023.  Posttreatment PET CT scan on 12/14/2021 showed no evidence of residual or recurrent disease.  Unfortunately patient began having worsening abdominal pain in June and underwent a CT scan on 02/17/2022.  This scan showed development of bulky lymphadenopathy in the abdomen consistent with recurrent lymphoma.  He underwent a PET CT scan today which unfortunately confirmed Deauville 5 lymphadenopathy.  At this time findings are deeply concerning for a recurrent diffuse large B-cell lymphoma.  We are currently having the patient scheduled for  biopsy of the lymph nodes to confirm recurrence.   Additionally patient will be an excellent candidate for CAR-T therapy/clinical trial and therefore I would recommend referral to a center able to provides these services.  My preference would be for [facility] but the patient notes he would like to consider other institutions and do research for options moving forward.  We will move expediently to have the patient referred to one of the local tertiary care centers for second line therapy.  Mr. Burch and his wife voiced understanding of the plan moving forward.  We addressed all the patient and his wife's questions and concerns.   Allen Peoples, MD Department of Hematology/Oncology [place] at [facility] Phone: [phone] Pager: [phone] Email: [email or web address]

## 2022-02-23 ENCOUNTER — Encounter: Payer: Self-pay | Admitting: Hematology

## 2022-02-23 NOTE — Telephone Encounter (Signed)
Allen Hodge [phone]) returned call.  [facility] authorization e-mailed to [email or web address].

## 2022-02-23 NOTE — Progress Notes (Signed)
Patient on schedule for Retroperitoneal LN biopsy 7/19, spoke with patient on phone with pre procedure instructions given. Made aware to be here @ 1000, NPO after MN prior to procedure as well as driver post procedure/recovery/discharge. Stated understanding.

## 2022-02-23 NOTE — Progress Notes (Signed)
Referral sent to Dr Alver Sorrow at [facility] on 02/22/22 and to Dr Raylene Everts at [facility] on 02/23/22 per pt request.

## 2022-02-26 ENCOUNTER — Ambulatory Visit (HOSPITAL_COMMUNITY): Payer: No Typology Code available for payment source

## 2022-02-27 ENCOUNTER — Encounter: Payer: Self-pay | Admitting: *Deleted

## 2022-02-27 ENCOUNTER — Other Ambulatory Visit: Payer: Self-pay | Admitting: Radiology

## 2022-02-27 DIAGNOSIS — C8338 Diffuse large B-cell lymphoma, lymph nodes of multiple sites: Secondary | ICD-10-CM

## 2022-02-27 NOTE — Telephone Encounter (Signed)
Resent authorization form at 11:10 am via e-mail and portal message sent to check attachment, junk, and spam folders.  Received delivery complete notice.  Called [name] who says he is near [facility] and will come by to sign.   Form to main entry receptionist desk.

## 2022-02-28 ENCOUNTER — Ambulatory Visit: Payer: No Typology Code available for payment source

## 2022-02-28 ENCOUNTER — Other Ambulatory Visit: Payer: Self-pay | Admitting: Interventional Radiology

## 2022-02-28 ENCOUNTER — Ambulatory Visit
Admission: RE | Admit: 2022-02-28 | Discharge: 2022-02-28 | Disposition: A | Discharge status: Home / Self Care | Payer: No Typology Code available for payment source | Source: Ambulatory Visit | Attending: Interventional Radiology | Admitting: Interventional Radiology

## 2022-02-28 ENCOUNTER — Other Ambulatory Visit: Payer: Self-pay

## 2022-02-28 ENCOUNTER — Other Ambulatory Visit: Payer: Self-pay | Admitting: Student-PharmD

## 2022-02-28 ENCOUNTER — Ambulatory Visit
Admission: RE | Admit: 2022-02-28 | Discharge: 2022-02-28 | Disposition: A | Discharge status: Home / Self Care | Payer: No Typology Code available for payment source | Source: Ambulatory Visit | Attending: Physician Assistant | Admitting: Physician Assistant

## 2022-02-28 DIAGNOSIS — R229 Localized swelling, mass and lump, unspecified: Secondary | ICD-10-CM | POA: Diagnosis not present

## 2022-02-28 DIAGNOSIS — C8330 Diffuse large B-cell lymphoma, unspecified site: Secondary | ICD-10-CM | POA: Diagnosis not present

## 2022-02-28 DIAGNOSIS — R2231 Localized swelling, mass and lump, right upper limb: Secondary | ICD-10-CM

## 2022-02-28 DIAGNOSIS — R591 Generalized enlarged lymph nodes: Secondary | ICD-10-CM | POA: Insufficient documentation

## 2022-02-28 DIAGNOSIS — D1779 Benign lipomatous neoplasm of other sites: Secondary | ICD-10-CM | POA: Insufficient documentation

## 2022-02-28 DIAGNOSIS — C8338 Diffuse large B-cell lymphoma, lymph nodes of multiple sites: Secondary | ICD-10-CM

## 2022-02-28 LAB — PROTIME-INR
INR: 1.1 (ref 0.8–1.2)
Prothrombin Time: 13.6 seconds (ref 11.4–15.2)

## 2022-02-28 MED ORDER — MIDAZOLAM HCL 2 MG/2ML IJ SOLN
INTRAMUSCULAR | Status: AC | PRN
  Administered 2022-02-28 (×2): 1 mg via INTRAVENOUS

## 2022-02-28 MED ORDER — SODIUM CHLORIDE 0.9 % IV SOLN: INTRAVENOUS | Status: DC

## 2022-02-28 MED ORDER — HEPARIN SOD (PORK) LOCK FLUSH 100 UNIT/ML IV SOLN
INTRAVENOUS | Status: AC
  Filled 2022-02-28: qty 5

## 2022-02-28 MED ORDER — OXYCODONE-ACETAMINOPHEN 5-325 MG PO TABS
1.0000 | ORAL_TABLET | ORAL | Status: AC
  Administered 2022-02-28: 1 via ORAL

## 2022-02-28 MED ORDER — OXYCODONE-ACETAMINOPHEN 5-325 MG PO TABS
ORAL_TABLET | ORAL | Status: AC
  Filled 2022-02-28: qty 1

## 2022-02-28 MED ORDER — HEPARIN SOD (PORK) LOCK FLUSH 100 UNIT/ML IV SOLN
500.0000 [IU] | Freq: Once | INTRAVENOUS | Status: AC
  Administered 2022-02-28: 500 [IU] via INTRAVENOUS

## 2022-02-28 MED ORDER — FENTANYL CITRATE (PF) 100 MCG/2ML IJ SOLN
INTRAMUSCULAR | Status: AC
  Filled 2022-02-28: qty 2

## 2022-02-28 MED ORDER — MIDAZOLAM HCL 2 MG/2ML IJ SOLN
INTRAMUSCULAR | Status: AC
  Filled 2022-02-28: qty 4

## 2022-02-28 MED ORDER — FENTANYL CITRATE (PF) 100 MCG/2ML IJ SOLN
INTRAMUSCULAR | Status: AC | PRN
  Administered 2022-02-28: 25 ug via INTRAVENOUS
  Administered 2022-02-28: 50 ug via INTRAVENOUS

## 2022-02-28 NOTE — H&P (Signed)
Chief Complaint: Patient was seen in consultation today for lymphadenopathy and core biopsy at the request of Allen Hodge  Referring Physician(s): Lincoln Brigham  Supervising Physician: Sandi Mariscal  Patient Status: [facility] - Out-pt  History of Present Illness: Allen Hodge is a 41 y.o. male with PMHx significant for diffuse large B cell lymphoma diagnosed in 2022 after right neck LN was biopsied s/p treatment with PET imaging on 12/14/2021 without evidence of residual or recurrent disease. The patient follows with oncology and complains of RUQ abdominal and back pain, recent CT and PET imaging done 02/22/22 revealed findings concerning for recurrent lymphoma with rapid progression of extensive hypermetabolic abdominal lymphadenopathy. Request received for IR biopsy. Today the patient has a palpable right axilla lump and therefore an [place] was performed to evaluate for lymphadenopathy that was not seen on recent PET. US findings today in right axilla consistent with lipoma.   The patient denies any current chest pain or shortness of breath. He admits to ongoing abdomina/back pain rated 4/10 currently. He denies any current blood thinner use. The patient denies any recent infections, fever or chills. He has no known complications to sedation.   Past Medical History:  Diagnosis Date   Allergy    Cancer ([place])    skin cancer   Herniated disc    Hx of skin cancer, basal cell    Hyperlipidemia    borderline- no meds    Non Hodgkin's lymphoma ([place])     Past Surgical History:  Procedure Laterality Date   COLONOSCOPY     last 2013   ESOPHAGOGASTRODUODENOSCOPY (EGD) WITH PROPOFOL N/A 06/29/2021   Procedure: ESOPHAGOGASTRODUODENOSCOPY (EGD) WITH PROPOFOL;  Surgeon: Milus Banister, MD;  Location: [facility];  Service: Endoscopy;  Laterality: N/A;   EUS N/A 06/29/2021   Procedure: UPPER ENDOSCOPIC ULTRASOUND (EUS) RADIAL;  Surgeon: Milus Banister, MD;  Location: [facility];   Service: Endoscopy;  Laterality: N/A;   FINE NEEDLE ASPIRATION N/A 06/29/2021   Procedure: FINE NEEDLE ASPIRATION (FNA) LINEAR;  Surgeon: Milus Banister, MD;  Location: [facility];  Service: Endoscopy;  Laterality: N/A;   IR IMAGING GUIDED PORT INSERTION  07/26/2021   VASECTOMY     Allergies: Bee pollen-1000-royal jelly [nutritional supplements], Gluten meal, and Lactose intolerance (gi)  Medications: Prior to Admission medications   Medication Sig Start Date End Date Taking? Authorizing Provider  acetaminophen (TYLENOL) 325 MG tablet Take 2 tablets (650 mg total) by mouth every 4 (four) hours as needed for mild pain. 11/16/21   Maryanna Shape, NP  Ascorbic Acid (VITAMIN C PO) Take 1 tablet by mouth 3 (three) times a week.    [provider]  b complex vitamins capsule Take 1 capsule by mouth 3 (three) times a week.    [provider]  Cholecalciferol (VITAMIN D-3 PO) Take 1 tablet by mouth 3 (three) times a week.    [provider]  Cyanocobalamin (B-12) 5000 MCG SUBL Take 1 tablet by mouth 2 (two) times daily. 01/29/22   [provider]  diphenhydrAMINE (BENADRYL) 25 MG tablet Take 25 mg by mouth at bedtime as needed (seasonal allergies).    [provider]  ibuprofen (ADVIL) 200 MG tablet Take 400 mg by mouth every 6 (six) hours as needed (pain).    [provider]  lidocaine-prilocaine (EMLA) cream Apply to affected area once Patient taking differently: Apply 1 application  topically daily as needed (prior to port access). 07/21/21   Brunetta Genera, MD  loratadine (CLARITIN) 10 MG tablet Take 10 mg by mouth daily as needed (seasonal allergies).    [provider]  LORazepam (ATIVAN) 0.5 MG tablet Take 1 tablet (0.5 mg total) by mouth every 6 (six) hours as needed (Nausea or vomiting). 11/16/21   Maryanna Shape, NP  methylcellulose (CITRUCEL) oral powder Take 1 packet by mouth daily. 02/07/22   Milus Banister, MD   Naltrexone HCl, Pain, 4.5 MG CAPS Take 1 tablet by mouth at bedtime. 01/25/22   [provider]  ondansetron (ZOFRAN) 8 MG tablet Take 1 tablet (8 mg total) by mouth 2 (two) times daily as needed for refractory nausea / vomiting. Start on day 3 after cyclophosphamide chemotherapy. 07/21/21   Brunetta Genera, MD  pantoprazole (PROTONIX) 40 MG tablet Take 1 tablet (40 mg total) by mouth daily before breakfast. Patient taking differently: Take 40 mg by mouth daily as needed (heartburn). 06/15/21   Esterwood, Amy S, PA-C  polyethylene glycol (MIRALAX / GLYCOLAX) 17 g packet Take 17 g by mouth daily as needed (constipation).    [provider]  predniSONE (DELTASONE) 20 MG tablet Take 1 tablet (20 mg total) by mouth as needed. 02/22/22   Lincoln Brigham, PA-C  senna-docusate (SENOKOT-S) 8.6-50 MG tablet Take 1 tablet by mouth at bedtime as needed for mild constipation. 09/15/21   Maryanna Shape, NP  Tetrahydrozoline HCl (VISINE OP) Place 1 drop into both eyes daily as needed (dry eyes).    [provider]     Family History  Problem Relation Age of Onset   Colon cancer Mother 83   Colon polyps Mother    Colon cancer Paternal Grandfather    Alzheimer's disease Paternal Grandfather    Breast cancer Maternal Aunt    Diabetes Maternal Grandmother    Prostate cancer Maternal Grandfather    Esophageal cancer Neg Hx    Rectal cancer Neg Hx    Stomach cancer Neg Hx     Social History   Socioeconomic History   Marital status: Married    Spouse name: Not on file   Number of children: 1   Years of education: Not on file   Highest education level: Not on file  Occupational History   Occupation: Sales   Tobacco Use   Smoking status: Never   Smokeless tobacco: Never  Substance and Sexual Activity   Alcohol use: Not Currently    Alcohol/week: 1.0 standard drink of alcohol    Types: 1 Glasses of wine per week    Comment: Rare    Drug use: No   Sexual activity: Not  on file  Other Topics Concern   Not on file  Social History Narrative   Daily caffeine    Social Determinants of Health   Financial Resource Strain: Not on file  Food Insecurity: Not on file  Transportation Needs: Not on file  Physical Activity: Not on file  Stress: Not on file  Social Connections: Not on file   Review of Systems: A 12 point ROS discussed and pertinent positives are indicated in the HPI above.  All other systems are negative.  Review of Systems  Vital Signs: BP 111/73   Pulse 74   Temp 97.9 F (36.6 C) (Oral)   Resp 17   SpO2 98%   Physical Exam Constitutional:      Appearance: Normal appearance.     Comments: Appears in moderate amount of pain  HENT:     Head: Normocephalic and  atraumatic.  Cardiovascular:     Rate and Rhythm: Normal rate and regular rhythm.  Pulmonary:     Effort: Pulmonary effort is normal. No respiratory distress.  Abdominal:     Palpations: Abdomen is soft.  Neurological:     Mental Status: He is alert and oriented to person, place, and time.     Imaging: NM PET Image Restag (PS) Skull Base To Thigh  Result Date: 02/22/2022 CLINICAL DATA:  Subsequent treatment strategy for diffuse large B-cell lymphoma. EXAM: NUCLEAR MEDICINE PET SKULL BASE TO THIGH TECHNIQUE: 9.5 mCi F-18 FDG was injected intravenously. Full-ring PET imaging was performed from the skull base to thigh after the radiotracer. CT data was obtained and used for attenuation correction and anatomic localization. Fasting blood glucose: 98 mg/dl COMPARISON:  Prior PET CTs. The most recent is 12/14/2021. Recent CT scan 02/17/2022. FINDINGS: Mediastinal blood pool activity: SUV max 2.36 Liver activity: SUV max 3.26 NECK: No hypermetabolic lymph nodes in the neck. Incidental CT findings: none CHEST: No hypermetabolic mediastinal or hilar nodes. No suspicious pulmonary nodules on the CT scan. Incidental CT findings: The left-sided Port-A-Cath is stable. Sherlon Handing and right  subclavian artery again noted. No acute pulmonary findings. No supraclavicular or axillary adenopathy. ABDOMEN/PELVIS: As seen on the recent CT scan there is extensive recurrent bulky mesenteric and retroperitoneal lymphadenopathy. The gastrohepatic and celiac axis lymphadenopathy has an SUV max of 7.73. Hepatic duodenal adenopathy has an SUV max of 7.70. Mesenteric adenopathy has an SUV max of 10.93. Left para-aortic adenopathy has an SUV max of 8.65. No pelvic or inguinal lymphadenopathy. No splenomegaly or splenic lesions to suggest splenic involvement. No hypermetabolic lesions involving the liver or adrenal glands. Incidental CT findings: none SKELETON: No hypermetabolic bone lesions to suggest lymphomatous involvement. Incidental CT findings: none IMPRESSION: Rapid progression of extensive hypermetabolic recurrent abdominal lymphoma ([place]). No evidence of lymphoma involving the neck, chest, pelvis or osseous structures. Electronically Signed   By: Marijo Sanes M.D.   On: 02/22/2022 11:05   CT ABDOMEN PELVIS W CONTRAST  Result Date: 02/17/2022 CLINICAL DATA:  Right upper quadrant pain. Recent chemotherapy for non-Hodgkin's lymphoma. * Tracking Code: BO * EXAM: CT ABDOMEN AND PELVIS WITH CONTRAST TECHNIQUE: Multidetector CT imaging of the abdomen and pelvis was performed using the standard protocol following bolus administration of intravenous contrast. RADIATION DOSE REDUCTION: This exam was performed according to the departmental dose-optimization program which includes automated exposure control, adjustment of the mA and/or kV according to patient size and/or use of iterative reconstruction technique. CONTRAST:  23m OMNIPAQUE IOHEXOL 300 MG/ML  SOLN COMPARISON:  PET-CT 12/14/2021 FINDINGS: Lower chest: Unremarkable Hepatobiliary: No suspicious focal abnormality within the liver parenchyma. There is no evidence for gallstones, gallbladder wall thickening, or pericholecystic fluid. No intrahepatic  or extrahepatic biliary dilation. Pancreas: No focal mass lesion. No dilatation of the main duct. No intraparenchymal cyst. No peripancreatic edema. Spleen: No splenomegaly. No focal mass lesion. Adrenals/Urinary Tract: No adrenal nodule or mass. Kidneys unremarkable. No evidence for hydroureter. The urinary bladder appears normal for the degree of distention. Stomach/Bowel: Stomach is unremarkable. No gastric wall thickening. No evidence of outlet obstruction. Duodenum is normally positioned as is the ligament of Treitz. No small bowel wall thickening. No small bowel dilatation. The terminal ileum is normal. The appendix is not well visualized, but there is no edema or inflammation in the region of the cecum. No gross colonic mass. No colonic wall thickening. Vascular/Lymphatic: No abdominal aortic aneurysm. Right retrocrural nodule on image  15/2 is probably the cisterna chyli. Interval development of bulky gastrohepatic ligament, hepatoduodenal ligament, central mesenteric and retroperitoneal lymphadenopathy. 4.0 x 3.3 cm hepatoduodenal ligament lymph node is seen on image 18/series 2. 2.3 cm short axis left para-aortic node seen on image 21/2. 2.5 cm aortocaval short axis lymph node seen on 27/2. 4.5 cm short axis right paramidline mesenteric node visible on 41/2. Clustered upper normal lymph nodes are seen in the right lower quadrant ileocolic mesentery. The bulky central abdominal lymphadenopathy generates mass-effect on the portal vein, branches of the superior mesenteric vein, and the splenic vein although all of these vascular structures remain patent. Celiac axis, common hepatic artery, splenic artery, and SMA are patent. No pelvic sidewall lymphadenopathy. Reproductive: The prostate gland and seminal vesicles are unremarkable. Other: No intraperitoneal free fluid. Musculoskeletal: No worrisome lytic or sclerotic osseous abnormality. IMPRESSION: 1. Since PET-CT of 12/14/2021, there has been development of  bulky lymphadenopathy in the abdomen consistent with recurrent lymphoma. 2. No other acute findings in the abdomen or pelvis. Electronically Signed   By: Misty Stanley M.D.   On: 02/17/2022 10:53    Labs:  CBC: Recent Labs    11/20/21 1317 11/27/21 1438 12/18/21 1210 02/21/22 0949  WBC 9.7 24.7* 5.7 6.7  HGB 10.7* 10.5* 11.4* 13.1  HCT 31.7* 32.0* 35.2* 38.2*  PLT 302 214 255 382    COAGS: Recent Labs    07/19/21 1020 02/28/22 1023  INR 1.1 1.1    BMP: Recent Labs    11/20/21 1317 11/27/21 1438 12/18/21 1210 02/21/22 0949  NA 135 137 139 138  K 4.1 3.8 4.1 3.6  CL 99 102 106 103  CO2 '28 28 29 28  '$ GLUCOSE 128* 103* 96 109*  BUN '19 10 12 15  '$ CALCIUM 9.0 9.4 9.4 9.9  CREATININE 0.75 1.28* 1.26* 0.84  GFRNONAA >60 >60 >60 >60    LIVER FUNCTION TESTS: Recent Labs    11/20/21 1317 11/27/21 1438 12/18/21 1210 02/21/22 0949  BILITOT 0.4 0.2* 0.3 0.3  AST 10* 14* 13* 10*  ALT '12 15 9 8  '$ ALKPHOS 41 73 46 49  PROT 6.1* 6.7 6.6 7.0  ALBUMIN 4.0 4.1 4.2 4.2    Assessment and Plan: This is a 41 year old male with PMHx significant for diffuse large B cell lymphoma diagnosed in 2022 after right neck LN was biopsied s/p treatment with PET imaging on 12/14/2021 without evidence of residual or recurrent disease. The patient follows with oncology and complains of back pain, recent CT and PET imaging done 02/22/22 revealed findings concerning for recurrent lymphoma with rapid progression of extensive hypermetabolic abdominal lymphadenopathy. Request received for IR biopsy.   The patient has been NPO, no blood thinners taken, imaging, labs and vitals have been reviewed.  Risks and benefits of CT guided biopsy of abdominal lymph node with moderate sedation was discussed with the patient and/or patient's family including, but not limited to bleeding, infection, damage to adjacent structures or low yield requiring additional tests.  All of the questions were answered and there is  agreement to proceed.  Consent signed and in chart.   Thank you for this interesting consult.  I greatly enjoyed meeting Sarthak Rubenstein and look forward to participating in their care.  A copy of this report was sent to the requesting provider on this date.  Electronically Signed: Hedy Jacob, PA-C 02/28/2022, 11:17 AM   I spent a total of 15 Minutes in face to face in clinical consultation, greater  than 50% of which was counseling/coordinating care for lymphadenopathy and core biopsy.

## 2022-02-28 NOTE — Procedures (Signed)
Pre procedural Dx: Recurrent lymphoma Post procedural Dx: Same  Technically successful CT guided biopsy of bulky abdominal nodal conglomeration.   EBL: None.  Complications: None immediate.   Ronny Bacon, MD Pager #: [phone]

## 2022-03-01 ENCOUNTER — Ambulatory Visit (HOSPITAL_COMMUNITY): Payer: No Typology Code available for payment source

## 2022-03-05 ENCOUNTER — Encounter: Payer: Self-pay | Admitting: Radiology

## 2022-03-07 ENCOUNTER — Telehealth: Payer: Self-pay | Admitting: Hematology

## 2022-03-07 NOTE — Telephone Encounter (Signed)
Called to sch per 7/25 inbasket, vmail full

## 2022-03-09 ENCOUNTER — Other Ambulatory Visit: Payer: Self-pay | Admitting: Hematology

## 2022-03-12 ENCOUNTER — Other Ambulatory Visit: Payer: Self-pay

## 2022-03-12 ENCOUNTER — Inpatient Hospital Stay: Payer: No Typology Code available for payment source

## 2022-03-12 VITALS — BP 105/82 | HR 73 | Resp 18

## 2022-03-12 DIAGNOSIS — Z95828 Presence of other vascular implants and grafts: Secondary | ICD-10-CM

## 2022-03-12 DIAGNOSIS — C8511 Unspecified B-cell lymphoma, lymph nodes of head, face, and neck: Secondary | ICD-10-CM | POA: Diagnosis not present

## 2022-03-12 DIAGNOSIS — Z7189 Other specified counseling: Secondary | ICD-10-CM

## 2022-03-12 MED ORDER — PEGFILGRASTIM-CBQV 6 MG/0.6ML ~~LOC~~ SOSY
6.0000 mg | PREFILLED_SYRINGE | Freq: Once | SUBCUTANEOUS | Status: AC
  Administered 2022-03-12: 6 mg via SUBCUTANEOUS
  Filled 2022-03-12: qty 0.6

## 2022-03-19 ENCOUNTER — Encounter: Payer: Self-pay | Admitting: Hematology

## 2022-03-19 LAB — SURGICAL PATHOLOGY

## 2022-03-20 ENCOUNTER — Other Ambulatory Visit: Payer: No Typology Code available for payment source

## 2022-03-20 ENCOUNTER — Telehealth: Payer: Self-pay

## 2022-03-20 ENCOUNTER — Ambulatory Visit: Payer: No Typology Code available for payment source | Admitting: Hematology

## 2022-03-20 NOTE — Telephone Encounter (Signed)
Notified Patient of completion of Critical Illness Form. Copy of form mailed to Patient as requested. Fax transmission confirmation received. No other needs or concerns voiced at this time.

## 2022-03-30 ENCOUNTER — Other Ambulatory Visit: Payer: Self-pay

## 2022-03-30 DIAGNOSIS — Z7189 Other specified counseling: Secondary | ICD-10-CM

## 2022-03-30 DIAGNOSIS — C8338 Diffuse large B-cell lymphoma, lymph nodes of multiple sites: Secondary | ICD-10-CM

## 2022-04-02 ENCOUNTER — Inpatient Hospital Stay: Payer: No Typology Code available for payment source | Attending: Hematology

## 2022-04-02 VITALS — BP 118/81 | HR 72 | Temp 98.2°F | Resp 18

## 2022-04-02 DIAGNOSIS — Z5189 Encounter for other specified aftercare: Secondary | ICD-10-CM | POA: Diagnosis not present

## 2022-04-02 DIAGNOSIS — C8511 Unspecified B-cell lymphoma, lymph nodes of head, face, and neck: Secondary | ICD-10-CM | POA: Insufficient documentation

## 2022-04-02 DIAGNOSIS — Z95828 Presence of other vascular implants and grafts: Secondary | ICD-10-CM

## 2022-04-02 DIAGNOSIS — Z7189 Other specified counseling: Secondary | ICD-10-CM

## 2022-04-02 MED ORDER — PEGFILGRASTIM-CBQV 6 MG/0.6ML ~~LOC~~ SOSY
6.0000 mg | PREFILLED_SYRINGE | Freq: Once | SUBCUTANEOUS | Status: AC
  Administered 2022-04-02: 6 mg via SUBCUTANEOUS
  Filled 2022-04-02: qty 0.6

## 2022-04-05 ENCOUNTER — Other Ambulatory Visit: Payer: Self-pay

## 2022-04-05 ENCOUNTER — Inpatient Hospital Stay: Payer: No Typology Code available for payment source

## 2022-04-05 DIAGNOSIS — Z95828 Presence of other vascular implants and grafts: Secondary | ICD-10-CM

## 2022-04-05 DIAGNOSIS — C8511 Unspecified B-cell lymphoma, lymph nodes of head, face, and neck: Secondary | ICD-10-CM | POA: Diagnosis not present

## 2022-04-05 DIAGNOSIS — C8338 Diffuse large B-cell lymphoma, lymph nodes of multiple sites: Secondary | ICD-10-CM

## 2022-04-05 DIAGNOSIS — Z7189 Other specified counseling: Secondary | ICD-10-CM

## 2022-04-05 LAB — CBC WITH DIFFERENTIAL (CANCER CENTER ONLY)
Abs Immature Granulocytes: 0 10*3/uL (ref 0.00–0.07)
Band Neutrophils: 4 %
Basophils Absolute: 0.1 10*3/uL (ref 0.0–0.1)
Basophils Relative: 1 %
Eosinophils Absolute: 0 10*3/uL (ref 0.0–0.5)
Eosinophils Relative: 0 %
HCT: 26.7 % — ABNORMAL LOW (ref 39.0–52.0)
Hemoglobin: 9.3 g/dL — ABNORMAL LOW (ref 13.0–17.0)
Lymphocytes Relative: 7 %
Lymphs Abs: 0.5 10*3/uL — ABNORMAL LOW (ref 0.7–4.0)
MCH: 29.1 pg (ref 26.0–34.0)
MCHC: 34.8 g/dL (ref 30.0–36.0)
MCV: 83.4 fL (ref 80.0–100.0)
Monocytes Absolute: 0 10*3/uL — ABNORMAL LOW (ref 0.1–1.0)
Monocytes Relative: 0 %
Neutro Abs: 6.8 10*3/uL (ref 1.7–7.7)
Neutrophils Relative %: 88 %
Platelet Count: 213 10*3/uL (ref 150–400)
RBC: 3.2 MIL/uL — ABNORMAL LOW (ref 4.22–5.81)
RDW: 14.7 % (ref 11.5–15.5)
Smear Review: NORMAL
WBC Count: 7.4 10*3/uL (ref 4.0–10.5)
nRBC: 0 % (ref 0.0–0.2)

## 2022-04-05 LAB — CMP (CANCER CENTER ONLY)
ALT: 8 U/L (ref 0–44)
AST: 11 U/L — ABNORMAL LOW (ref 15–41)
Albumin: 4.1 g/dL (ref 3.5–5.0)
Alkaline Phosphatase: 56 U/L (ref 38–126)
Anion gap: 7 (ref 5–15)
BUN: 14 mg/dL (ref 6–20)
CO2: 26 mmol/L (ref 22–32)
Calcium: 9.4 mg/dL (ref 8.9–10.3)
Chloride: 104 mmol/L (ref 98–111)
Creatinine: 0.47 mg/dL — ABNORMAL LOW (ref 0.61–1.24)
GFR, Estimated: >60 mL/min (ref >60)
Glucose, Bld: 96 mg/dL (ref 70–99)
Potassium: 4 mmol/L (ref 3.5–5.1)
Sodium: 137 mmol/L (ref 135–145)
Total Bilirubin: 0.8 mg/dL (ref 0.3–1.2)
Total Protein: 6.3 g/dL — ABNORMAL LOW (ref 6.5–8.1)

## 2022-04-05 LAB — LACTATE DEHYDROGENASE: LDH: 529 U/L — ABNORMAL HIGH (ref 98–192)

## 2022-04-05 LAB — MAGNESIUM: Magnesium: 1.9 mg/dL (ref 1.7–2.4)

## 2022-04-05 MED ORDER — HEPARIN SOD (PORK) LOCK FLUSH 100 UNIT/ML IV SOLN
500.0000 [IU] | Freq: Once | INTRAVENOUS | Status: AC
  Administered 2022-04-05: 500 [IU]

## 2022-04-05 MED ORDER — SODIUM CHLORIDE 0.9% FLUSH
10.0000 mL | Freq: Once | INTRAVENOUS | Status: AC
  Administered 2022-04-05: 10 mL

## 2022-04-09 ENCOUNTER — Inpatient Hospital Stay: Payer: No Typology Code available for payment source

## 2022-04-09 ENCOUNTER — Other Ambulatory Visit: Payer: Self-pay

## 2022-04-09 DIAGNOSIS — Z7189 Other specified counseling: Secondary | ICD-10-CM

## 2022-04-09 DIAGNOSIS — Z95828 Presence of other vascular implants and grafts: Secondary | ICD-10-CM

## 2022-04-09 DIAGNOSIS — C8511 Unspecified B-cell lymphoma, lymph nodes of head, face, and neck: Secondary | ICD-10-CM | POA: Diagnosis not present

## 2022-04-09 DIAGNOSIS — C8338 Diffuse large B-cell lymphoma, lymph nodes of multiple sites: Secondary | ICD-10-CM

## 2022-04-09 LAB — CBC WITH DIFFERENTIAL (CANCER CENTER ONLY)
Abs Immature Granulocytes: 0.06 10*3/uL (ref 0.00–0.07)
Basophils Absolute: 0 10*3/uL (ref 0.0–0.1)
Basophils Relative: 0 %
Eosinophils Absolute: 0 10*3/uL (ref 0.0–0.5)
Eosinophils Relative: 0 %
HCT: 26.4 % — ABNORMAL LOW (ref 39.0–52.0)
Hemoglobin: 9.1 g/dL — ABNORMAL LOW (ref 13.0–17.0)
Immature Granulocytes: 2 %
Lymphocytes Relative: 24 %
Lymphs Abs: 1 10*3/uL (ref 0.7–4.0)
MCH: 29.2 pg (ref 26.0–34.0)
MCHC: 34.5 g/dL (ref 30.0–36.0)
MCV: 84.6 fL (ref 80.0–100.0)
Monocytes Absolute: 0.9 10*3/uL (ref 0.1–1.0)
Monocytes Relative: 21 %
Neutro Abs: 2.2 10*3/uL (ref 1.7–7.7)
Neutrophils Relative %: 53 %
Platelet Count: 101 10*3/uL — ABNORMAL LOW (ref 150–400)
RBC: 3.12 MIL/uL — ABNORMAL LOW (ref 4.22–5.81)
RDW: 15.1 % (ref 11.5–15.5)
Smear Review: NORMAL
WBC Count: 4.1 10*3/uL (ref 4.0–10.5)
nRBC: 0.7 % — ABNORMAL HIGH (ref 0.0–0.2)

## 2022-04-09 LAB — CMP (CANCER CENTER ONLY)
ALT: 10 U/L (ref 0–44)
AST: 9 U/L — ABNORMAL LOW (ref 15–41)
Albumin: 4.3 g/dL (ref 3.5–5.0)
Alkaline Phosphatase: 48 U/L (ref 38–126)
Anion gap: 8 (ref 5–15)
BUN: 15 mg/dL (ref 6–20)
CO2: 27 mmol/L (ref 22–32)
Calcium: 9.9 mg/dL (ref 8.9–10.3)
Chloride: 103 mmol/L (ref 98–111)
Creatinine: 0.55 mg/dL — ABNORMAL LOW (ref 0.61–1.24)
GFR, Estimated: >60 mL/min (ref >60)
Glucose, Bld: 108 mg/dL — ABNORMAL HIGH (ref 70–99)
Potassium: 3.9 mmol/L (ref 3.5–5.1)
Sodium: 138 mmol/L (ref 135–145)
Total Bilirubin: 0.3 mg/dL (ref 0.3–1.2)
Total Protein: 6.3 g/dL — ABNORMAL LOW (ref 6.5–8.1)

## 2022-04-09 LAB — LACTATE DEHYDROGENASE: LDH: 292 U/L — ABNORMAL HIGH (ref 98–192)

## 2022-04-09 MED ORDER — SODIUM CHLORIDE 0.9% FLUSH
10.0000 mL | Freq: Once | INTRAVENOUS | Status: AC
  Administered 2022-04-09: 10 mL

## 2022-04-09 MED ORDER — HEPARIN SOD (PORK) LOCK FLUSH 100 UNIT/ML IV SOLN
500.0000 [IU] | Freq: Once | INTRAVENOUS | Status: AC
  Administered 2022-04-09: 500 [IU]

## 2022-04-22 ENCOUNTER — Other Ambulatory Visit: Payer: Self-pay | Admitting: Physician Assistant

## 2022-05-16 ENCOUNTER — Encounter: Payer: Self-pay | Admitting: Hematology

## 2022-05-25 ENCOUNTER — Other Ambulatory Visit: Payer: No Typology Code available for payment source

## 2022-05-25 ENCOUNTER — Ambulatory Visit: Payer: No Typology Code available for payment source | Admitting: Hematology and Oncology

## 2022-05-29 ENCOUNTER — Other Ambulatory Visit: Payer: Self-pay

## 2022-05-29 DIAGNOSIS — C8338 Diffuse large B-cell lymphoma, lymph nodes of multiple sites: Secondary | ICD-10-CM

## 2022-05-31 ENCOUNTER — Other Ambulatory Visit: Payer: Self-pay

## 2022-05-31 ENCOUNTER — Inpatient Hospital Stay: Payer: No Typology Code available for payment source | Attending: Hematology

## 2022-05-31 DIAGNOSIS — C8338 Diffuse large B-cell lymphoma, lymph nodes of multiple sites: Secondary | ICD-10-CM

## 2022-05-31 DIAGNOSIS — C8511 Unspecified B-cell lymphoma, lymph nodes of head, face, and neck: Secondary | ICD-10-CM | POA: Diagnosis not present

## 2022-05-31 DIAGNOSIS — Z95828 Presence of other vascular implants and grafts: Secondary | ICD-10-CM

## 2022-05-31 LAB — CMP (CANCER CENTER ONLY)
ALT: 16 U/L (ref 0–44)
AST: 13 U/L — ABNORMAL LOW (ref 15–41)
Albumin: 4.2 g/dL (ref 3.5–5.0)
Alkaline Phosphatase: 33 U/L — ABNORMAL LOW (ref 38–126)
Anion gap: 5 (ref 5–15)
BUN: 5 mg/dL — ABNORMAL LOW (ref 6–20)
CO2: 27 mmol/L (ref 22–32)
Calcium: 9.2 mg/dL (ref 8.9–10.3)
Chloride: 104 mmol/L (ref 98–111)
Creatinine: 0.86 mg/dL (ref 0.61–1.24)
GFR, Estimated: >60 mL/min (ref >60)
Glucose, Bld: 90 mg/dL (ref 70–99)
Potassium: 4.2 mmol/L (ref 3.5–5.1)
Sodium: 136 mmol/L (ref 135–145)
Total Bilirubin: 0.5 mg/dL (ref 0.3–1.2)
Total Protein: 5.8 g/dL — ABNORMAL LOW (ref 6.5–8.1)

## 2022-05-31 LAB — CBC WITH DIFFERENTIAL (CANCER CENTER ONLY)
Abs Immature Granulocytes: 0 10*3/uL (ref 0.00–0.07)
Basophils Absolute: 0 10*3/uL (ref 0.0–0.1)
Basophils Relative: 1 %
Eosinophils Absolute: 0 10*3/uL (ref 0.0–0.5)
Eosinophils Relative: 1 %
HCT: 25.2 % — ABNORMAL LOW (ref 39.0–52.0)
Hemoglobin: 8.7 g/dL — ABNORMAL LOW (ref 13.0–17.0)
Immature Granulocytes: 0 %
Lymphocytes Relative: 60 %
Lymphs Abs: 1 10*3/uL (ref 0.7–4.0)
MCH: 33.2 pg (ref 26.0–34.0)
MCHC: 34.5 g/dL (ref 30.0–36.0)
MCV: 96.2 fL (ref 80.0–100.0)
Monocytes Absolute: 0.3 10*3/uL (ref 0.1–1.0)
Monocytes Relative: 20 %
Neutro Abs: 0.3 10*3/uL — CL (ref 1.7–7.7)
Neutrophils Relative %: 18 %
Platelet Count: 82 10*3/uL — ABNORMAL LOW (ref 150–400)
RBC: 2.62 MIL/uL — ABNORMAL LOW (ref 4.22–5.81)
RDW: 23 % — ABNORMAL HIGH (ref 11.5–15.5)
WBC Count: 1.6 10*3/uL — ABNORMAL LOW (ref 4.0–10.5)
nRBC: 0 % (ref 0.0–0.2)

## 2022-05-31 LAB — MAGNESIUM: Magnesium: 1.8 mg/dL (ref 1.7–2.4)

## 2022-05-31 LAB — LACTATE DEHYDROGENASE: LDH: 175 U/L (ref 98–192)

## 2022-05-31 MED ORDER — SODIUM CHLORIDE 0.9% FLUSH
10.0000 mL | Freq: Once | INTRAVENOUS | Status: AC
  Administered 2022-05-31: 10 mL via INTRAVENOUS

## 2022-05-31 MED ORDER — HEPARIN SOD (PORK) LOCK FLUSH 100 UNIT/ML IV SOLN
500.0000 [IU] | Freq: Once | INTRAVENOUS | Status: AC
  Administered 2022-05-31: 500 [IU] via INTRAVENOUS

## 2022-05-31 NOTE — Progress Notes (Signed)
Critical lab value called to [facility]/ Hem Onc : ANC 0.3. Lab values that have resulted were also faxed to [facility] per their request.

## 2022-06-01 LAB — IGG, IGA, IGM
IgA: 21 mg/dL — ABNORMAL LOW (ref 90–386)
IgG (Immunoglobin G), Serum: 420 mg/dL — ABNORMAL LOW (ref 603–1613)
IgM (Immunoglobulin M), Srm: 25 mg/dL (ref 20–172)

## 2022-06-06 ENCOUNTER — Inpatient Hospital Stay: Payer: No Typology Code available for payment source

## 2022-06-06 ENCOUNTER — Telehealth: Payer: Self-pay

## 2022-06-06 DIAGNOSIS — C8338 Diffuse large B-cell lymphoma, lymph nodes of multiple sites: Secondary | ICD-10-CM

## 2022-06-06 DIAGNOSIS — C8511 Unspecified B-cell lymphoma, lymph nodes of head, face, and neck: Secondary | ICD-10-CM | POA: Diagnosis not present

## 2022-06-06 DIAGNOSIS — Z7189 Other specified counseling: Secondary | ICD-10-CM

## 2022-06-06 DIAGNOSIS — Z95828 Presence of other vascular implants and grafts: Secondary | ICD-10-CM

## 2022-06-06 LAB — CMP (CANCER CENTER ONLY)
ALT: 8 U/L (ref 0–44)
AST: 11 U/L — ABNORMAL LOW (ref 15–41)
Albumin: 4.3 g/dL (ref 3.5–5.0)
Alkaline Phosphatase: 29 U/L — ABNORMAL LOW (ref 38–126)
Anion gap: 6 (ref 5–15)
BUN: 8 mg/dL (ref 6–20)
CO2: 26 mmol/L (ref 22–32)
Calcium: 9.3 mg/dL (ref 8.9–10.3)
Chloride: 106 mmol/L (ref 98–111)
Creatinine: 0.72 mg/dL (ref 0.61–1.24)
GFR, Estimated: >60 mL/min (ref >60)
Glucose, Bld: 83 mg/dL (ref 70–99)
Potassium: 4.1 mmol/L (ref 3.5–5.1)
Sodium: 138 mmol/L (ref 135–145)
Total Bilirubin: 0.4 mg/dL (ref 0.3–1.2)
Total Protein: 5.9 g/dL — ABNORMAL LOW (ref 6.5–8.1)

## 2022-06-06 LAB — CBC WITH DIFFERENTIAL (CANCER CENTER ONLY)
Abs Immature Granulocytes: 0 10*3/uL (ref 0.00–0.07)
Basophils Absolute: 0 10*3/uL (ref 0.0–0.1)
Basophils Relative: 1 %
Eosinophils Absolute: 0 10*3/uL (ref 0.0–0.5)
Eosinophils Relative: 0 %
HCT: 26.2 % — ABNORMAL LOW (ref 39.0–52.0)
Hemoglobin: 9 g/dL — ABNORMAL LOW (ref 13.0–17.0)
Immature Granulocytes: 0 %
Lymphocytes Relative: 50 %
Lymphs Abs: 1 10*3/uL (ref 0.7–4.0)
MCH: 33.5 pg (ref 26.0–34.0)
MCHC: 34.4 g/dL (ref 30.0–36.0)
MCV: 97.4 fL (ref 80.0–100.0)
Monocytes Absolute: 0.6 10*3/uL (ref 0.1–1.0)
Monocytes Relative: 29 %
Neutro Abs: 0.4 10*3/uL — CL (ref 1.7–7.7)
Neutrophils Relative %: 20 %
Platelet Count: 124 10*3/uL — ABNORMAL LOW (ref 150–400)
RBC: 2.69 MIL/uL — ABNORMAL LOW (ref 4.22–5.81)
RDW: 23.4 % — ABNORMAL HIGH (ref 11.5–15.5)
WBC Count: 1.9 10*3/uL — ABNORMAL LOW (ref 4.0–10.5)
nRBC: 1 % — ABNORMAL HIGH (ref 0.0–0.2)

## 2022-06-06 LAB — LACTATE DEHYDROGENASE: LDH: 160 U/L (ref 98–192)

## 2022-06-06 LAB — MAGNESIUM: Magnesium: 1.9 mg/dL (ref 1.7–2.4)

## 2022-06-06 MED ORDER — SODIUM CHLORIDE 0.9% FLUSH
10.0000 mL | Freq: Once | INTRAVENOUS | Status: AC
  Administered 2022-06-06: 10 mL

## 2022-06-06 MED ORDER — HEPARIN SOD (PORK) LOCK FLUSH 100 UNIT/ML IV SOLN
500.0000 [IU] | Freq: Once | INTRAVENOUS | Status: AC
  Administered 2022-06-06: 500 [IU]

## 2022-06-06 NOTE — Telephone Encounter (Signed)
CRITICAL VALUE STICKER  CRITICAL VALUE: ANC 0.4  RECEIVER (on-site recipient of call): Arna Snipe RN  [place] NOTIFIED: [phone]  06/06/22  MESSENGER (representative from lab): Lauren  MD NOTIFIED:  Dr. Diona Browner RN  TIME OF NOTIFICATION: [phone]

## 2022-06-06 NOTE — Progress Notes (Signed)
ANC 0.4. Attempted multiple times to call report to [facility] Hem Onc RN/ critical value. Unable to reach RN. All lab results faxed per [facility] Hem [facility]c request.

## 2022-06-07 LAB — IGG, IGA, IGM
IgA: 19 mg/dL — ABNORMAL LOW (ref 90–386)
IgG (Immunoglobin G), Serum: 428 mg/dL — ABNORMAL LOW (ref 603–1613)
IgM (Immunoglobulin M), Srm: 20 mg/dL (ref 20–172)

## 2022-06-25 ENCOUNTER — Inpatient Hospital Stay: Payer: No Typology Code available for payment source | Attending: Hematology

## 2022-06-25 ENCOUNTER — Inpatient Hospital Stay (HOSPITAL_BASED_OUTPATIENT_CLINIC_OR_DEPARTMENT_OTHER): Payer: No Typology Code available for payment source | Admitting: Hematology

## 2022-06-25 VITALS — BP 101/70 | HR 78 | Temp 97.7°F | Resp 15 | Wt 177.9 lb

## 2022-06-25 DIAGNOSIS — C8338 Diffuse large B-cell lymphoma, lymph nodes of multiple sites: Secondary | ICD-10-CM

## 2022-06-25 DIAGNOSIS — D801 Nonfamilial hypogammaglobulinemia: Secondary | ICD-10-CM | POA: Diagnosis not present

## 2022-06-25 DIAGNOSIS — Z95828 Presence of other vascular implants and grafts: Secondary | ICD-10-CM

## 2022-06-25 DIAGNOSIS — Z7189 Other specified counseling: Secondary | ICD-10-CM

## 2022-06-25 LAB — CMP (CANCER CENTER ONLY)
ALT: 7 U/L (ref 0–44)
AST: 13 U/L — ABNORMAL LOW (ref 15–41)
Albumin: 4.7 g/dL (ref 3.5–5.0)
Alkaline Phosphatase: 23 U/L — ABNORMAL LOW (ref 38–126)
Anion gap: 9 (ref 5–15)
BUN: 6 mg/dL (ref 6–20)
CO2: 24 mmol/L (ref 22–32)
Calcium: 9.7 mg/dL (ref 8.9–10.3)
Chloride: 105 mmol/L (ref 98–111)
Creatinine: 0.81 mg/dL (ref 0.61–1.24)
GFR, Estimated: >60 mL/min (ref >60)
Glucose, Bld: 87 mg/dL (ref 70–99)
Potassium: 4.1 mmol/L (ref 3.5–5.1)
Sodium: 138 mmol/L (ref 135–145)
Total Bilirubin: 0.4 mg/dL (ref 0.3–1.2)
Total Protein: 6.4 g/dL — ABNORMAL LOW (ref 6.5–8.1)

## 2022-06-25 LAB — CBC WITH DIFFERENTIAL (CANCER CENTER ONLY)
Abs Immature Granulocytes: 0.02 10*3/uL (ref 0.00–0.07)
Basophils Absolute: 0 10*3/uL (ref 0.0–0.1)
Basophils Relative: 1 %
Eosinophils Absolute: 0 10*3/uL (ref 0.0–0.5)
Eosinophils Relative: 1 %
HCT: 30.9 % — ABNORMAL LOW (ref 39.0–52.0)
Hemoglobin: 10.6 g/dL — ABNORMAL LOW (ref 13.0–17.0)
Immature Granulocytes: 1 %
Lymphocytes Relative: 46 %
Lymphs Abs: 1.1 10*3/uL (ref 0.7–4.0)
MCH: 35.1 pg — ABNORMAL HIGH (ref 26.0–34.0)
MCHC: 34.3 g/dL (ref 30.0–36.0)
MCV: 102.3 fL — ABNORMAL HIGH (ref 80.0–100.0)
Monocytes Absolute: 0.4 10*3/uL (ref 0.1–1.0)
Monocytes Relative: 19 %
Neutro Abs: 0.8 10*3/uL — ABNORMAL LOW (ref 1.7–7.7)
Neutrophils Relative %: 32 %
Platelet Count: 188 10*3/uL (ref 150–400)
RBC: 3.02 MIL/uL — ABNORMAL LOW (ref 4.22–5.81)
RDW: 18.6 % — ABNORMAL HIGH (ref 11.5–15.5)
WBC Count: 2.3 10*3/uL — ABNORMAL LOW (ref 4.0–10.5)
nRBC: 0 % (ref 0.0–0.2)

## 2022-06-25 LAB — LACTATE DEHYDROGENASE: LDH: 178 U/L (ref 98–192)

## 2022-06-25 LAB — MAGNESIUM: Magnesium: 2 mg/dL (ref 1.7–2.4)

## 2022-06-25 MED ORDER — SODIUM CHLORIDE 0.9% FLUSH
10.0000 mL | Freq: Once | INTRAVENOUS | Status: AC
  Administered 2022-06-25: 10 mL

## 2022-06-25 MED ORDER — HEPARIN SOD (PORK) LOCK FLUSH 100 UNIT/ML IV SOLN
500.0000 [IU] | Freq: Once | INTRAVENOUS | Status: AC
  Administered 2022-06-25: 500 [IU]

## 2022-06-25 NOTE — Progress Notes (Signed)
HEMATOLOGY/ONCOLOGY CLINIC NOTE  Date of Service: 06/25/22    Patient Care Team: Jonnie Kind, MD as PCP - General (Family Medicine) Transplant team at [facility] Dr. 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 He received 2 cycles of R-CHOP after which treatment was switched to dose adjusted R-EPOCH for the remaining 4 cycles due to the double expressor status. Interval PET CT scan after 3 cycles on 09/21/21 showed a complete response (Deauville 2). He completed 6 total cycles of treatment on 11/13/21. 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 He had a end of treatment PET CT scan on 12/14/21 which also showed a complete response. He was placed on surveillance.   RECURRENCE Patient developed recurrent, progressively worsening abdominal pain in 01/2022 and underwent a CT scan on 02/17/22. Results showed interval development of bulky adenopathy in the abdomen, highly concerning for recurrence. PET CT 02/22/22 showed extensive mesenteric adenopathy with SUV max 10.93. He underwent a core needle bx of the mesenteric mass on 02/28/22 and results showed recurrent DLBCL, similar to previous. He was referred to [place] for discussion of treatment options and consideration of CAR-T cell therapy. He received one cycle of R-GDC starting 03/02/22 for disease control prior to leukapheresis.   Second line Lymphodepleting Regimen: Fludarabine 30 mg/m2 IV daily for 3 doses and cyclophosphamide 500 mg/m2 IV daily for 3 doses.(Completed on 04/20/2022)  CAR-T Product: Axicabtagene Ciloleucel / Mont Dutton.04/23/2022  CRS Highest Grade: 3. Manifested as Fever and Hypotension. Last occurrence/resolution: 05/01/2022.  ICANS Highest Grade: None.   WBC  Recover (First Day >1,500): 05/31/2022.  Platelet Recover (First Day >75,000): 05/31/2022 with last platelet transfusion given NA.   HISTORY OF PRESENTING ILLNESS:   Please see previous notes for details on initial presentation  INTERVAL HISTORY  [name] is here for follow-up of his large B-cell lymphoma.  Patient was last seen by me on 12/18/2021 and was doing well without any new medical concerns. He recently had DLBCL s/p [place] CAR-T 04/23/2022 at [facility].  He is now about 2 months out status post treatment and is progressively improving.   He notes he had gum problems after this therapy, but it has been better now. 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 He was noted to have prolonged pancytopenia and has required transfusion support previously.  His labs have continued to recover. 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 He is also an ongoing antimicrobial prophylaxis per his transplant team with Acyclovir 800 mg BID for HSV/VZV. Levofloxacin 500 mg daily for bacterial until ANC consistently >1000. Fluconazole 400 mg daily for fungal until ANC consistently >1000. Bactrim DS 1 tab Monday, Wednesday, Friday for PCP for at least 6 months.   His PET CT scan day +30 on 05/23/2022 showed 1.  Deauville 2 as demonstrated by a significant interval decrease in size and FDG avidity of the previously described lymphadenopathy with a few residual mesenteric lymph nodes with FDG uptake similar to blood pool. This constitutes complete response by the [facility] classification.   He complains of muscle pains during today's visit, usually at night. He notes the pain is worse when he walks. He reports his family has been sick this week, but  he is doing well.  He regularly takes Vitamin-B, Vitamin-D, and Vitamin-C supplements.   He denies back pain, abdominal pain, leg swelling, fever, abnormal bowl  moments, loss of appetite, shortness of breath, and chills.   Patient is interested in starting IVIG preferably before thanksgiving.  He has follow-ups with his transplant team on day +100 and day +180  Labs done today were discussed in details.    MEDICAL HISTORY:  Past Medical History:  Diagnosis Date   Allergy    Cancer (HCC)    skin cancer   Herniated disc    Hx of skin cancer, basal cell    Hyperlipidemia    borderline- no meds    Non Hodgkin's lymphoma (HCC)     SURGICAL HISTORY: Past Surgical History:  Procedure Laterality Date   COLONOSCOPY     last 2013   ESOPHAGOGASTRODUODENOSCOPY (EGD) WITH PROPOFOL N/A 06/29/2021   Procedure: ESOPHAGOGASTRODUODENOSCOPY (EGD) WITH PROPOFOL;  Surgeon: Rachael Fee, MD;  Location: [facility] ENDOSCOPY;  Service: Endoscopy;  Laterality: N/A;   EUS N/A 06/29/2021   Procedure: UPPER ENDOSCOPIC ULTRASOUND (EUS) RADIAL;  Surgeon: Rachael Fee, MD;  Location: [facility] ENDOSCOPY;  Service: Endoscopy;  Laterality: N/A;   FINE NEEDLE ASPIRATION N/A 06/29/2021   Procedure: FINE NEEDLE ASPIRATION (FNA) LINEAR;  Surgeon: Rachael Fee, MD;  Location: [facility] ENDOSCOPY;  Service: Endoscopy;  Laterality: N/A;   IR IMAGING GUIDED PORT INSERTION  07/26/2021   VASECTOMY      SOCIAL HISTORY: Social History   Socioeconomic History   Marital status: Married    Spouse name: Not on file   Number of children: 1   Years of education: Not on file   Highest education level: Not on file  Occupational History   Occupation: Sales   Tobacco Use   Smoking status: Never   Smokeless tobacco: Never  Substance and Sexual Activity   Alcohol use: Not Currently    Alcohol/week: 1.0 standard drink of alcohol    Types: 1 Glasses of wine per week    Comment: Rare    Drug use: No   Sexual activity: Not on file  Other Topics Concern   Not on file  Social History Narrative   Daily caffeine    Social Determinants of Health   Financial Resource Strain: Not on  file  Food Insecurity: Not on file  Transportation Needs: Not on file  Physical Activity: Not on file  Stress: Not on file  Social Connections: Not on file  Intimate Partner Violence: Not on file    FAMILY HISTORY: Family History  Problem Relation Age of Onset   Colon cancer Mother 42   Colon polyps Mother    Colon cancer Paternal Grandfather    Alzheimer's disease Paternal Grandfather    Breast cancer Maternal Aunt    Diabetes Maternal Grandmother    Prostate cancer Maternal Grandfather    Esophageal cancer Neg Hx    Rectal cancer Neg Hx    Stomach cancer Neg Hx     ALLERGIES:  is allergic to bee pollen-1000-royal jelly [nutritional supplements], gluten meal, and lactose intolerance (gi).  MEDICATIONS:  Current Outpatient Medications  Medication Sig Dispense Refill   acetaminophen (TYLENOL) 325 MG tablet Take 2 tablets (650 mg total) by mouth every 4 (four) hours as needed for mild pain.     Ascorbic Acid (VITAMIN C PO) Take 1 tablet by mouth 3 (three) times a week.     b complex vitamins capsule Take 1 capsule by  mouth 3 (three) times a week.     Cholecalciferol (VITAMIN D-3 PO) Take 1 tablet by mouth 3 (three) times a week.     Cyanocobalamin (B-12) 5000 MCG SUBL Take 1 tablet by mouth 2 (two) times daily.     diphenhydrAMINE (BENADRYL) 25 MG tablet Take 25 mg by mouth at bedtime as needed (seasonal allergies).     ibuprofen (ADVIL) 200 MG tablet Take 400 mg by mouth every 6 (six) hours as needed (pain).     lidocaine-prilocaine (EMLA) cream Apply to affected area once (Patient taking differently: Apply 1 application  topically daily as needed (prior to port access).) 30 g 3   loratadine (CLARITIN) 10 MG tablet Take 10 mg by mouth daily as needed (seasonal allergies).     LORazepam (ATIVAN) 0.5 MG tablet Take 1 tablet (0.5 mg total) by mouth every 6 (six) hours as needed (Nausea or vomiting). 30 tablet 0   methylcellulose (CITRUCEL) oral powder Take 1 packet by mouth daily.      Naltrexone HCl, Pain, 4.5 MG CAPS Take 1 tablet by mouth at bedtime.     ondansetron (ZOFRAN) 8 MG tablet Take 1 tablet (8 mg total) by mouth 2 (two) times daily as needed for refractory nausea / vomiting. Start on day 3 after cyclophosphamide chemotherapy. 30 tablet 1   pantoprazole (PROTONIX) 40 MG tablet Take 1 tablet (40 mg total) by mouth daily before breakfast. (Patient taking differently: Take 40 mg by mouth daily as needed (heartburn).) 30 tablet 2   polyethylene glycol (MIRALAX / GLYCOLAX) 17 g packet Take 17 g by mouth daily as needed (constipation).     predniSONE (DELTASONE) 20 MG tablet Take 1 tablet (20 mg total) by mouth as needed. 60 tablet 0   senna-docusate (SENOKOT-S) 8.6-50 MG tablet Take 1 tablet by mouth at bedtime as needed for mild constipation.     Tetrahydrozoline HCl (VISINE OP) Place 1 drop into both eyes daily as needed (dry eyes).     No current facility-administered medications for this visit.    REVIEW OF SYSTEMS:   10 Point review of Systems was done is negative except as noted above.   PHYSICAL EXAMINATION: ECOG PERFORMANCE STATUS: 1 - Symptomatic but completely ambulatory  Vitals:   06/25/22 0923  BP: 101/70  Pulse: 78  Resp: 15  Temp: 97.7 F (36.5 C)  SpO2: 100%    Filed Weights   06/25/22 0923  Weight: 177 lb 14.4 oz (80.7 kg)    .Body mass index is 23.47 kg/m. [name]  NAD GENERAL:alert, in no acute distress and comfortable SKIN: no acute rashes, no significant lesions, hair is starting to regrow EYES: conjunctiva are pink and non-injected, sclera anicteric OROPHARYNX: MMM, no exudates, no oropharyngeal erythema or ulceration NECK: supple, no JVD LYMPH:  no palpable lymphadenopathy in the cervical, axillary or inguinal regions LUNGS: clear to auscultation b/l with normal respiratory effort HEART: regular rate & rhythm ABDOMEN:  normoactive bowel sounds , non tender, not distended.  No palpable hepatosplenomegaly Extremity: no pedal  edema PSYCH: alert & oriented x 3 with fluent speech NEURO: no focal motor/sensory deficits  LABORATORY DATA:   I have reviewed the data as listed     Latest Ref Rng & Units 06/25/2022    8:34 AM 06/06/2022    8:29 AM 05/31/2022    9:56 AM  CBC  WBC 4.0 - 10.5 K/uL 2.3  1.9  1.6   Hemoglobin 13.0 - 17.0 g/dL 16.1  9.0  8.7  Hematocrit 39.0 - 52.0 % 30.9  26.2  25.2   Platelets 150 - 400 K/uL 188  124  82    .Marland KitchenCBC    Component Value Date/Time   WBC 2.3 (L) 06/25/2022 0834   WBC 7.9 11/17/2021 0550   RBC 3.02 (L) 06/25/2022 0834   HGB 10.6 (L) 06/25/2022 0834   HCT 30.9 (L) 06/25/2022 0834   PLT 188 06/25/2022 0834   MCV 102.3 (H) 06/25/2022 0834   MCH 35.1 (H) 06/25/2022 0834   MCHC 34.3 06/25/2022 0834   RDW 18.6 (H) 06/25/2022 0834   LYMPHSABS 1.1 06/25/2022 0834   MONOABS 0.4 06/25/2022 0834   EOSABS 0.0 06/25/2022 0834   BASOSABS 0.0 06/25/2022 0834   ANC 800 .    Latest Ref Rng & Units 06/25/2022    8:34 AM 06/06/2022    8:29 AM 05/31/2022    9:56 AM  CMP  Glucose 70 - 99 mg/dL 87  83  90   BUN 6 - 20 mg/dL 6  8  5    Creatinine 0.61 - 1.24 mg/dL 6.96  2.95  2.84   Sodium 135 - 145 mmol/L 138  138  136   Potassium 3.5 - 5.1 mmol/L 4.1  4.1  4.2   Chloride 98 - 111 mmol/L 105  106  104   CO2 22 - 32 mmol/L 24  26  27    Calcium 8.9 - 10.3 mg/dL 9.7  9.3  9.2   Total Protein 6.5 - 8.1 g/dL 6.4  5.9  5.8   Total Bilirubin 0.3 - 1.2 mg/dL 0.4  0.4  0.5   Alkaline Phos 38 - 126 U/L 23  29  33   AST 15 - 41 U/L 13  11  13    ALT 0 - 44 U/L 7  8  16     MAGNESIUM 2 . Lab Results  Component Value Date   LDH 178 06/25/2022   SURGICAL PATHOLOGY  CASE: [id number]  PATIENT: [name]  Surgical Pathology Report   Specimen Submitted:  A. Lymph node, right neck   Clinical History: History of malignant non Hodgkin's lymphoma after EUS  biopsy on 11/17, however sample was insufficient to subclassify for  treatment planning.  Now post US guided bx  of hypermetabolic right  supraclavicular lymph node   DIAGNOSIS:  A. LYMPH NODE, RIGHT NECK; ULTRASOUND-GUIDED BIOPSY:  - LARGE B-CELL LYMPHOMA.  - SEE COMMENT.   Comment:  Sections demonstrate sheets of large, markedly atypical cells with  prominent macronucleoli.  Mitotic activity is brisk.  Flow cytometry  demonstrates a CD5 negative, CD10 negative monoclonal B cell population.  Definitive classification is dependent on prognostically significant IHC  and FISH studies, which will be reported in an addendum.      RADIOGRAPHIC STUDIES: I have personally reviewed the radiological images as listed and agreed with the findings in the report.  PET scan 05/23/2022: FINDINGS: DISEASE-RELATED FINDINGS:   Relative to prior PET/CT dated 04/11/2022, there is significant interval decrease in size and FDG avidity of the previously described multifocal central mesenteric and retroperitoneal lymphadenopathy. For reference, there is a left para-aortic lymph node (image 215) now measuring approximately 1 x 0.7 cm with SUV max 1.3, previously measuring 2 x 1.8 cm with SUV max 5.3 and a left peripancreatic lymph node (image 196) measuring approximately 1.6 x 0.9 cm, with SUV max 1.7, previously measuring 2.7 x 2.4 cm with SUV max 3.1. Although comparison of measurements of the previously described largest nodal  conglomerate is difficult due to the prior multilobulated morphology, there is marked interval decrease in size with residual lesions measuring approximately 2 x 1.5 cm, with SUV max 2.7 (image 228) and 1.6 x 0.7 cm with SUV max 1.8 (image 229).   Interval decrease in FDG avidity of the spleen, which is now below liver, with SUV max 2, previously 2.8. Similar craniocaudal dimension of the spleen, measuring approximately 8 cm.  OTHER TRACER-RELATED FINDINGS:   Interval decrease of diffuse FDG activity involving the bone marrow. No osseus foci of increased FDG uptake are identified.   Focus of  increased FDG uptake along the left retromolar trigone (image 45), without CT correlate likely odontogenic.   Asymmetric increased FDG uptake along the right posterior larynx is favored physiologic.   Expected physiologic activity within the kidneys, ureter, bladder, oropharynx, salivary glands, stomach, bowel and brain.  ANCILLARY CT FINDINGS:   Similar left internal jugular portacatheter with the tip terminating in the right atrium.   Interval decrease in size of the pancreas without increased FDG uptake.   Polyarticular degenerative changes.  CONCLUSION:  1.  Deauville 2 as demonstrated by a significant interval decrease in size and FDG avidity of the previously described lymphadenopathy with a few residual mesenteric lymph nodes with FDG uptake similar to blood pool. This constitutes complete response by the [facility] classification. 2.  Ancillary CT findings as above. Exam End: 05/23/22 08:34   PET CT scan 05/23/2022 post CAR-T cell therapy day +30 PET LYMPHOMA NON HODGKINS (W/LOW DOSE CT)  Anatomical Region Laterality Modality  Abdomen -- Positron Emission Tomography (PET)   Impression   1.  Deauville 2 as demonstrated by a significant interval decrease in size and FDG avidity of the previously described lymphadenopathy with a few residual mesenteric lymph nodes with FDG uptake similar to blood pool. This constitutes complete response by the [facility] classification. 2.  Ancillary CT findings as above.   ASSESSMENT & PLAN:   Very pleasant 42 year old gentleman with some chronic food related bowel irregularities with  1) Stage IVA large B-cell lymphoma NOS.  Double expressor ABC type non-germinal center (activated B cell) immunophenotype. Co-expression of BCL2 and C-MYC by IHC may be associated with a more aggressive clinical course.   Immunohistochemical studies demonstrate the tumor cells to be positive  for CD20, BCL2, BCL6, MUM1, and C-MYC, and negative for CD5, CD10, CD30,   and EBER ISH.   non-germinal center (activated B cell) immunophenotype.  Co-expression of BCL2 and C-MYC by IHC may be associated with a more aggressive clinical course.   Lymphoma FISH panel shows no evidence of c-Myc rearrangement ruling out double hit or triple hit lymphoma. Patient presented with extensive abdominal and retroperitoneal lymphadenopathy, right supraclavicular lymphadenopathy and lung nodules concerning for lymphoma involvement as well as concern for small bowel involvement. 2) pulmonary nodules concerning for lymphoma involvement 3) 4.4 cm small bowel thickening concerning for involvement by lymphoma.  Cannot rule out adenocarcinoma but less likely. 4) acute kidney injury.  Patient's creatinine had bumped to 1.6 but on repeat is back down to normal. 5) Food intolerances including gluten and dairy avoidance. 6) DLBCL s/p CAR-T Therapy  Oncology history:  First-line therapy with R-CHOP starting on 07/27/21. He received 2 cycles of R-CHOP after which treatment was switched to dose adjusted R-EPOCH for the remaining 4 cycles due to the double expressor status. Interval PET CT scan after 3 cycles on 09/21/21 showed a complete response (Deauville 2). He completed 6 total cycles of treatment  on 11/13/21. 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 He had a end of treatment PET CT scan on 12/14/21 which also showed a complete response. He was placed on surveillance.   RECURRENCE Patient developed recurrent, progressively worsening abdominal pain in 01/2022 and underwent a CT scan on 02/17/22. Results showed interval development of bulky adenopathy in the abdomen, highly concerning for recurrence. PET CT 02/22/22 showed extensive mesenteric adenopathy with SUV max 10.93. He underwent a core needle bx of the mesenteric mass on 02/28/22 and results showed recurrent DLBCL, similar to previous. He was referred to [place] for discussion of treatment options and consideration of  CAR-T cell therapy. He received one cycle of R-GDC starting 03/02/22 for disease control prior to leukapheresis.   Second line Lymphodepleting Regimen: Fludarabine 30 mg/m2 IV daily for 3 doses and cyclophosphamide 500 mg/m2 IV daily for 3 doses.(Completed on 04/20/2022)  CAR-T Product: Axicabtagene Ciloleucel / [place] Dutton.04/23/2022  CRS Highest Grade: 3. Manifested as Fever and Hypotension. Last occurrence/resolution: 05/01/2022.  ICANS Highest Grade: None.   WBC Recover (First Day >1,500): 05/31/2022.  Platelet Recover (First Day >75,000): 05/31/2022 with last platelet transfusion given NA.   PLAN -Discussed lab results from today. Labs showed WBC of 2.3 K with ANC of 800, hemoglobin of 10.6 , and platelets of 188k. Stable CMP.  Magnesium within normal limits LDH within normal limits -PET CT scan done on 05/23/2022 day +30 after car T-cell therapy showed complete radiographic remission.  Results were discussed in detail with the patient and his questions were answered. -Recommended to continue to his Vitamin B, Vitamin C, B-Complex and Vitamin D supplements.  -He continues to be pancytopenic but his counts are continue to improve. -He continues to be on antimicrobial prophylaxis per his transplant team -Will start IVIG before thanksgiving.  -Answered all patient's questions about IVIG.   #7 history of BK Viruria Symptoms resolved. Continue adequate hydration.   #8 Prolonged Pancytopenia Continue with Acyclovir 800 mg BID for HSV/VZV. Levofloxacin 500 mg daily for bacterial until ANC consistently >1000. Fluconazole 400 mg daily for fungal until ANC consistently >1000. Bactrim DS 1 tab Monday, Wednesday, Friday for PCP for at least 6 months.   -Discussed neutropenic precautions -Continue to monitor labs -Transplant team considering Nplate and G-CSF if counts do not improve spontaneously. Currently no Nplate needed since platelets are within normal limits ANC is progressively improving  and is 800 currently.  Will need ongoing monitoring  #9 Hypogammaglobulinemia with IgG1 subclass deficiency  suggestion  Information displayed in this report may not trend or trigger automated decision support.    Ref Range & Units 8 d ago  IGG 635 - 1741 MG/DL 914 Low   IGM 45 - 782 MG/DL <95 Low   IGA 66 - 621 MG/DL 20 Low     -Patient will be started on IVIG monthly to reduce risk of infections in the context of hypogammaglobinemia and IgG 1 subclass deficiency as per his transplant team recommendations. -We will do first dose with 1 g/kg and based on repeat labs might cut that down to 0.5 g/kg with subsequent doses for maintenance  Prophylaxis  ID Acyclovir 800 mg BID for HSV/VZV. Levofloxacin 500 mg daily for bacterial. Fluconazole 400 mg daily for fungal. Bactrim DS 1 tab Monday, Wednesday, Friday for PCP.   GERD Continue omeprazole 20 mg daily.    Supplement Use Vitamin B complex, Vitamin D3/K2, Vitamin C all ok to resume now post CAR-T.   Anxiety/Anticipatory  Nausea Continue lorazepam 0.5 mg every 8 hours as needed.   Follow-up:  IVIG 1g/kg in 3-5 days IVIG 0.5g/kg every month thereafter x 3 with labs Has upcoming labs within a month with his transplant team RTC with Dr Candise Che with labs in 2 months   The total time spent in the appointment was 60 minutes* .  All of the patient's questions were answered with apparent satisfaction. The patient knows to call the clinic with any problems, questions or concerns.   Colonel Bald, am acting as a scribe for Wyvonnia Lora, MD.  Wyvonnia Lora MD MS AAHIVMS [facility] Hematology/Oncology Physician [facility]  .*Total Encounter Time as defined by the Centers for Medicare and Medicaid Services includes, in addition to the face-to-face time of a patient visit (documented in the note above) non-face-to-face time: obtaining and reviewing outside history, ordering and reviewing medications, tests or procedures, care  coordination (communications with other health care professionals or caregivers) and documentation in the medical record.

## 2022-06-28 ENCOUNTER — Inpatient Hospital Stay: Payer: No Typology Code available for payment source

## 2022-06-28 ENCOUNTER — Encounter: Payer: Self-pay | Admitting: Hematology

## 2022-07-03 ENCOUNTER — Inpatient Hospital Stay: Payer: No Typology Code available for payment source

## 2022-07-03 ENCOUNTER — Encounter: Payer: Self-pay | Admitting: Hematology

## 2022-07-03 VITALS — BP 101/64 | HR 80 | Temp 98.4°F | Resp 14 | Wt 183.0 lb

## 2022-07-03 DIAGNOSIS — C8580 Other specified types of non-Hodgkin lymphoma, unspecified site: Secondary | ICD-10-CM

## 2022-07-03 DIAGNOSIS — Z95828 Presence of other vascular implants and grafts: Secondary | ICD-10-CM

## 2022-07-03 DIAGNOSIS — D801 Nonfamilial hypogammaglobulinemia: Secondary | ICD-10-CM

## 2022-07-03 DIAGNOSIS — C8338 Diffuse large B-cell lymphoma, lymph nodes of multiple sites: Secondary | ICD-10-CM

## 2022-07-03 DIAGNOSIS — Z7189 Other specified counseling: Secondary | ICD-10-CM

## 2022-07-03 LAB — CMP (CANCER CENTER ONLY)
ALT: 7 U/L (ref 0–44)
AST: 13 U/L — ABNORMAL LOW (ref 15–41)
Albumin: 4.5 g/dL (ref 3.5–5.0)
Alkaline Phosphatase: 24 U/L — ABNORMAL LOW (ref 38–126)
Anion gap: 7 (ref 5–15)
BUN: 10 mg/dL (ref 6–20)
CO2: 25 mmol/L (ref 22–32)
Calcium: 9.6 mg/dL (ref 8.9–10.3)
Chloride: 107 mmol/L (ref 98–111)
Creatinine: 0.77 mg/dL (ref 0.61–1.24)
GFR, Estimated: >60 mL/min (ref >60)
Glucose, Bld: 96 mg/dL (ref 70–99)
Potassium: 3.8 mmol/L (ref 3.5–5.1)
Sodium: 139 mmol/L (ref 135–145)
Total Bilirubin: 0.3 mg/dL (ref 0.3–1.2)
Total Protein: 5.9 g/dL — ABNORMAL LOW (ref 6.5–8.1)

## 2022-07-03 LAB — CBC WITH DIFFERENTIAL (CANCER CENTER ONLY)
Abs Immature Granulocytes: 0.15 10*3/uL — ABNORMAL HIGH (ref 0.00–0.07)
Basophils Absolute: 0 10*3/uL (ref 0.0–0.1)
Basophils Relative: 2 %
Eosinophils Absolute: 0 10*3/uL (ref 0.0–0.5)
Eosinophils Relative: 2 %
HCT: 31.5 % — ABNORMAL LOW (ref 39.0–52.0)
Hemoglobin: 10.8 g/dL — ABNORMAL LOW (ref 13.0–17.0)
Immature Granulocytes: 7 %
Lymphocytes Relative: 35 %
Lymphs Abs: 0.8 10*3/uL (ref 0.7–4.0)
MCH: 35.2 pg — ABNORMAL HIGH (ref 26.0–34.0)
MCHC: 34.3 g/dL (ref 30.0–36.0)
MCV: 102.6 fL — ABNORMAL HIGH (ref 80.0–100.0)
Monocytes Absolute: 0.4 10*3/uL (ref 0.1–1.0)
Monocytes Relative: 18 %
Neutro Abs: 0.9 10*3/uL — ABNORMAL LOW (ref 1.7–7.7)
Neutrophils Relative %: 36 %
Platelet Count: 182 10*3/uL (ref 150–400)
RBC: 3.07 MIL/uL — ABNORMAL LOW (ref 4.22–5.81)
RDW: 16.2 % — ABNORMAL HIGH (ref 11.5–15.5)
Smear Review: NORMAL
WBC Count: 2.3 10*3/uL — ABNORMAL LOW (ref 4.0–10.5)
nRBC: 0 % (ref 0.0–0.2)

## 2022-07-03 LAB — MAGNESIUM: Magnesium: 1.9 mg/dL (ref 1.7–2.4)

## 2022-07-03 LAB — LACTATE DEHYDROGENASE: LDH: 174 U/L (ref 98–192)

## 2022-07-03 MED ORDER — HEPARIN SOD (PORK) LOCK FLUSH 100 UNIT/ML IV SOLN
500.0000 [IU] | Freq: Once | INTRAVENOUS | Status: AC | PRN
  Administered 2022-07-03: 500 [IU]

## 2022-07-03 MED ORDER — DEXTROSE 5 % IV SOLN: Freq: Once | INTRAVENOUS | Status: AC

## 2022-07-03 MED ORDER — DIPHENHYDRAMINE HCL 25 MG PO CAPS
25.0000 mg | ORAL_CAPSULE | Freq: Once | ORAL | Status: AC
  Administered 2022-07-03: 25 mg via ORAL
  Filled 2022-07-03: qty 1

## 2022-07-03 MED ORDER — FAMOTIDINE IN NACL 20-0.9 MG/50ML-% IV SOLN
20.0000 mg | Freq: Once | INTRAVENOUS | Status: AC
  Administered 2022-07-03: 20 mg via INTRAVENOUS
  Filled 2022-07-03: qty 50

## 2022-07-03 MED ORDER — METHYLPREDNISOLONE SODIUM SUCC 125 MG IJ SOLR
125.0000 mg | Freq: Once | INTRAMUSCULAR | Status: AC
  Administered 2022-07-03: 125 mg via INTRAVENOUS
  Filled 2022-07-03: qty 2

## 2022-07-03 MED ORDER — ACETAMINOPHEN 325 MG PO TABS
650.0000 mg | ORAL_TABLET | Freq: Once | ORAL | Status: AC
  Administered 2022-07-03: 650 mg via ORAL
  Filled 2022-07-03: qty 2

## 2022-07-03 MED ORDER — SODIUM CHLORIDE 0.9% FLUSH
10.0000 mL | INTRAVENOUS | Status: DC | PRN
  Administered 2022-07-03: 10 mL via INTRAVENOUS

## 2022-07-03 MED ORDER — SODIUM CHLORIDE 0.9% FLUSH
10.0000 mL | Freq: Once | INTRAVENOUS | Status: AC | PRN
  Administered 2022-07-03: 10 mL

## 2022-07-03 MED ORDER — IMMUNE GLOBULIN (HUMAN) 10 GM/100ML IV SOLN
1.0000 g/kg | Freq: Once | INTRAVENOUS | Status: AC
  Administered 2022-07-03: 85 g via INTRAVENOUS
  Filled 2022-07-03: qty 850

## 2022-07-03 NOTE — Patient Instructions (Signed)

## 2022-07-03 NOTE — Progress Notes (Signed)
Pt observed for 30 minutes post IVIG infusion. Pt tolerated trtmt well w/out incident. VSS at discharge.  Ambulatory to lobby.

## 2022-07-05 LAB — IGG, IGA, IGM
IgA: 16 mg/dL — ABNORMAL LOW (ref 90–386)
IgG (Immunoglobin G), Serum: 408 mg/dL — ABNORMAL LOW (ref 603–1613)
IgM (Immunoglobulin M), Srm: 18 mg/dL — ABNORMAL LOW (ref 20–172)

## 2022-07-26 ENCOUNTER — Other Ambulatory Visit: Payer: No Typology Code available for payment source

## 2022-07-26 ENCOUNTER — Ambulatory Visit: Payer: No Typology Code available for payment source

## 2022-08-07 ENCOUNTER — Inpatient Hospital Stay: Payer: No Typology Code available for payment source | Attending: Hematology

## 2022-08-07 ENCOUNTER — Inpatient Hospital Stay: Payer: No Typology Code available for payment source

## 2022-08-07 ENCOUNTER — Other Ambulatory Visit: Payer: Self-pay | Admitting: Hematology

## 2022-08-07 VITALS — BP 109/70 | HR 74 | Temp 98.3°F | Resp 14 | Wt 184.8 lb

## 2022-08-07 DIAGNOSIS — C8580 Other specified types of non-Hodgkin lymphoma, unspecified site: Secondary | ICD-10-CM

## 2022-08-07 DIAGNOSIS — Z7189 Other specified counseling: Secondary | ICD-10-CM

## 2022-08-07 DIAGNOSIS — Z95828 Presence of other vascular implants and grafts: Secondary | ICD-10-CM

## 2022-08-07 DIAGNOSIS — D801 Nonfamilial hypogammaglobulinemia: Secondary | ICD-10-CM

## 2022-08-07 DIAGNOSIS — C8338 Diffuse large B-cell lymphoma, lymph nodes of multiple sites: Secondary | ICD-10-CM

## 2022-08-07 LAB — CBC WITH DIFFERENTIAL (CANCER CENTER ONLY)
Abs Immature Granulocytes: 0 10*3/uL (ref 0.00–0.07)
Basophils Absolute: 0 10*3/uL (ref 0.0–0.1)
Basophils Relative: 0 %
Eosinophils Absolute: 0 10*3/uL (ref 0.0–0.5)
Eosinophils Relative: 1 %
HCT: 33.7 % — ABNORMAL LOW (ref 39.0–52.0)
Hemoglobin: 11.8 g/dL — ABNORMAL LOW (ref 13.0–17.0)
Immature Granulocytes: 0 %
Lymphocytes Relative: 22 %
Lymphs Abs: 0.7 10*3/uL (ref 0.7–4.0)
MCH: 34.4 pg — ABNORMAL HIGH (ref 26.0–34.0)
MCHC: 35 g/dL (ref 30.0–36.0)
MCV: 98.3 fL (ref 80.0–100.0)
Monocytes Absolute: 0.4 10*3/uL (ref 0.1–1.0)
Monocytes Relative: 14 %
Neutro Abs: 1.9 10*3/uL (ref 1.7–7.7)
Neutrophils Relative %: 63 %
Platelet Count: 177 10*3/uL (ref 150–400)
RBC: 3.43 MIL/uL — ABNORMAL LOW (ref 4.22–5.81)
RDW: 12.1 % (ref 11.5–15.5)
WBC Count: 3 10*3/uL — ABNORMAL LOW (ref 4.0–10.5)
nRBC: 0 % (ref 0.0–0.2)

## 2022-08-07 LAB — CMP (CANCER CENTER ONLY)
ALT: 7 U/L (ref 0–44)
AST: 12 U/L — ABNORMAL LOW (ref 15–41)
Albumin: 4 g/dL (ref 3.5–5.0)
Alkaline Phosphatase: 34 U/L — ABNORMAL LOW (ref 38–126)
Anion gap: 7 (ref 5–15)
BUN: 12 mg/dL (ref 6–20)
CO2: 26 mmol/L (ref 22–32)
Calcium: 9.5 mg/dL (ref 8.9–10.3)
Chloride: 106 mmol/L (ref 98–111)
Creatinine: 0.82 mg/dL (ref 0.61–1.24)
GFR, Estimated: >60 mL/min (ref >60)
Glucose, Bld: 98 mg/dL (ref 70–99)
Potassium: 4 mmol/L (ref 3.5–5.1)
Sodium: 139 mmol/L (ref 135–145)
Total Bilirubin: 0.3 mg/dL (ref 0.3–1.2)
Total Protein: 6.7 g/dL (ref 6.5–8.1)

## 2022-08-07 LAB — LACTATE DEHYDROGENASE: LDH: 130 U/L (ref 98–192)

## 2022-08-07 LAB — MAGNESIUM: Magnesium: 2.1 mg/dL (ref 1.7–2.4)

## 2022-08-07 MED ORDER — HEPARIN SOD (PORK) LOCK FLUSH 100 UNIT/ML IV SOLN
500.0000 [IU] | Freq: Once | INTRAVENOUS | Status: AC | PRN
  Administered 2022-08-07: 500 [IU]

## 2022-08-07 MED ORDER — ACETAMINOPHEN 325 MG PO TABS
650.0000 mg | ORAL_TABLET | Freq: Once | ORAL | Status: AC
  Administered 2022-08-07: 650 mg via ORAL
  Filled 2022-08-07: qty 2

## 2022-08-07 MED ORDER — SODIUM CHLORIDE 0.9% FLUSH
10.0000 mL | Freq: Once | INTRAVENOUS | Status: AC | PRN
  Administered 2022-08-07: 10 mL

## 2022-08-07 MED ORDER — DEXTROSE 5 % IV SOLN: Freq: Once | INTRAVENOUS | Status: AC

## 2022-08-07 MED ORDER — FAMOTIDINE IN NACL 20-0.9 MG/50ML-% IV SOLN
20.0000 mg | Freq: Once | INTRAVENOUS | Status: AC
  Administered 2022-08-07: 20 mg via INTRAVENOUS
  Filled 2022-08-07: qty 50

## 2022-08-07 MED ORDER — DIPHENHYDRAMINE HCL 25 MG PO CAPS
25.0000 mg | ORAL_CAPSULE | Freq: Once | ORAL | Status: AC
  Administered 2022-08-07: 25 mg via ORAL
  Filled 2022-08-07: qty 1

## 2022-08-07 MED ORDER — SODIUM CHLORIDE 0.9% FLUSH
10.0000 mL | INTRAVENOUS | Status: AC | PRN
  Administered 2022-08-07: 10 mL

## 2022-08-07 MED ORDER — METHYLPREDNISOLONE SODIUM SUCC 125 MG IJ SOLR
80.0000 mg | INTRAMUSCULAR | Status: DC
  Administered 2022-08-07: 80 mg via INTRAVENOUS
  Filled 2022-08-07: qty 2

## 2022-08-07 MED ORDER — IMMUNE GLOBULIN (HUMAN) 10 GM/100ML IV SOLN
40.0000 g | INTRAVENOUS | Status: DC
  Administered 2022-08-07: 40 g via INTRAVENOUS
  Filled 2022-08-07: qty 400

## 2022-08-07 NOTE — Progress Notes (Signed)
Pt declined to be observed for 30 minutes post Privigen infusion. Pt tolerated trtmt well w/out incident. VSS at discharge.  Ambulatory to lobby.

## 2022-08-07 NOTE — Patient Instructions (Signed)

## 2022-08-28 ENCOUNTER — Encounter: Payer: Self-pay | Admitting: Hematology

## 2022-08-31 ENCOUNTER — Ambulatory Visit: Payer: No Typology Code available for payment source | Admitting: Hematology

## 2022-08-31 ENCOUNTER — Ambulatory Visit: Payer: No Typology Code available for payment source

## 2022-08-31 ENCOUNTER — Other Ambulatory Visit: Payer: No Typology Code available for payment source

## 2022-09-03 ENCOUNTER — Encounter: Payer: Self-pay | Admitting: Hematology

## 2022-09-04 ENCOUNTER — Other Ambulatory Visit: Payer: Self-pay

## 2022-09-04 ENCOUNTER — Inpatient Hospital Stay: Payer: 59 | Attending: Hematology

## 2022-09-04 ENCOUNTER — Inpatient Hospital Stay: Payer: 59

## 2022-09-04 ENCOUNTER — Inpatient Hospital Stay (HOSPITAL_BASED_OUTPATIENT_CLINIC_OR_DEPARTMENT_OTHER): Payer: 59 | Admitting: Hematology

## 2022-09-04 VITALS — BP 106/71 | HR 74 | Temp 97.3°F | Resp 20 | Wt 181.6 lb

## 2022-09-04 VITALS — BP 115/69 | HR 68 | Resp 18

## 2022-09-04 DIAGNOSIS — D801 Nonfamilial hypogammaglobulinemia: Secondary | ICD-10-CM

## 2022-09-04 DIAGNOSIS — C8580 Other specified types of non-Hodgkin lymphoma, unspecified site: Secondary | ICD-10-CM

## 2022-09-04 DIAGNOSIS — C8338 Diffuse large B-cell lymphoma, lymph nodes of multiple sites: Secondary | ICD-10-CM

## 2022-09-04 DIAGNOSIS — Z7189 Other specified counseling: Secondary | ICD-10-CM

## 2022-09-04 DIAGNOSIS — Z95828 Presence of other vascular implants and grafts: Secondary | ICD-10-CM

## 2022-09-04 LAB — CMP (CANCER CENTER ONLY)
ALT: 8 U/L (ref 0–44)
AST: 14 U/L — ABNORMAL LOW (ref 15–41)
Albumin: 4.2 g/dL (ref 3.5–5.0)
Alkaline Phosphatase: 43 U/L (ref 38–126)
Anion gap: 9 (ref 5–15)
BUN: 10 mg/dL (ref 6–20)
CO2: 25 mmol/L (ref 22–32)
Calcium: 9.7 mg/dL (ref 8.9–10.3)
Chloride: 104 mmol/L (ref 98–111)
Creatinine: 0.88 mg/dL (ref 0.61–1.24)
GFR, Estimated: >60 mL/min (ref >60)
Glucose, Bld: 151 mg/dL — ABNORMAL HIGH (ref 70–99)
Potassium: 4.2 mmol/L (ref 3.5–5.1)
Sodium: 138 mmol/L (ref 135–145)
Total Bilirubin: 0.4 mg/dL (ref 0.3–1.2)
Total Protein: 6.6 g/dL (ref 6.5–8.1)

## 2022-09-04 LAB — CBC WITH DIFFERENTIAL (CANCER CENTER ONLY)
Abs Immature Granulocytes: 0.01 10*3/uL (ref 0.00–0.07)
Basophils Absolute: 0 10*3/uL (ref 0.0–0.1)
Basophils Relative: 1 %
Eosinophils Absolute: 0.1 10*3/uL (ref 0.0–0.5)
Eosinophils Relative: 2 %
HCT: 36.3 % — ABNORMAL LOW (ref 39.0–52.0)
Hemoglobin: 12.8 g/dL — ABNORMAL LOW (ref 13.0–17.0)
Immature Granulocytes: 0 %
Lymphocytes Relative: 24 %
Lymphs Abs: 0.8 10*3/uL (ref 0.7–4.0)
MCH: 34.3 pg — ABNORMAL HIGH (ref 26.0–34.0)
MCHC: 35.3 g/dL (ref 30.0–36.0)
MCV: 97.3 fL (ref 80.0–100.0)
Monocytes Absolute: 0.2 10*3/uL (ref 0.1–1.0)
Monocytes Relative: 7 %
Neutro Abs: 2.2 10*3/uL (ref 1.7–7.7)
Neutrophils Relative %: 66 %
Platelet Count: 213 10*3/uL (ref 150–400)
RBC: 3.73 MIL/uL — ABNORMAL LOW (ref 4.22–5.81)
RDW: 12.1 % (ref 11.5–15.5)
WBC Count: 3.3 10*3/uL — ABNORMAL LOW (ref 4.0–10.5)
nRBC: 0 % (ref 0.0–0.2)

## 2022-09-04 LAB — LACTATE DEHYDROGENASE: LDH: 114 U/L (ref 98–192)

## 2022-09-04 MED ORDER — DIPHENHYDRAMINE HCL 25 MG PO CAPS
25.0000 mg | ORAL_CAPSULE | Freq: Once | ORAL | Status: AC
  Administered 2022-09-04: 25 mg via ORAL
  Filled 2022-09-04: qty 1

## 2022-09-04 MED ORDER — SODIUM CHLORIDE 0.9% FLUSH
10.0000 mL | Freq: Once | INTRAVENOUS | Status: AC | PRN
  Administered 2022-09-04: 10 mL

## 2022-09-04 MED ORDER — IMMUNE GLOBULIN (HUMAN) 10 GM/100ML IV SOLN
400.0000 mg/kg | Freq: Once | INTRAVENOUS | Status: AC
  Administered 2022-09-04: 35 g via INTRAVENOUS
  Filled 2022-09-04: qty 350

## 2022-09-04 MED ORDER — HEPARIN SOD (PORK) LOCK FLUSH 100 UNIT/ML IV SOLN
500.0000 [IU] | Freq: Once | INTRAVENOUS | Status: AC | PRN
  Administered 2022-09-04: 500 [IU]

## 2022-09-04 MED ORDER — METHYLPREDNISOLONE SODIUM SUCC 125 MG IJ SOLR
60.0000 mg | Freq: Once | INTRAMUSCULAR | Status: AC
  Administered 2022-09-04: 60 mg via INTRAVENOUS
  Filled 2022-09-04: qty 2

## 2022-09-04 MED ORDER — DEXTROSE 5 % IV SOLN: Freq: Once | INTRAVENOUS | Status: AC

## 2022-09-04 MED ORDER — SODIUM CHLORIDE 0.9% FLUSH
10.0000 mL | Freq: Once | INTRAVENOUS | Status: AC
  Administered 2022-09-04: 10 mL

## 2022-09-04 MED ORDER — ACETAMINOPHEN 325 MG PO TABS
650.0000 mg | ORAL_TABLET | Freq: Once | ORAL | Status: AC
  Administered 2022-09-04: 650 mg via ORAL
  Filled 2022-09-04: qty 2

## 2022-09-04 MED ORDER — FAMOTIDINE IN NACL 20-0.9 MG/50ML-% IV SOLN
20.0000 mg | Freq: Once | INTRAVENOUS | Status: AC
  Administered 2022-09-04: 20 mg via INTRAVENOUS
  Filled 2022-09-04: qty 50

## 2022-09-04 NOTE — Progress Notes (Signed)
Patient seen by MD today  Vitals are within treatment parameters.  Labs reviewed: and are within treatment parameters."  Per physician team, patient is ready for treatment and there are NO modifications to the treatment plan.

## 2022-09-04 NOTE — Patient Instructions (Signed)

## 2022-09-04 NOTE — Progress Notes (Signed)
HEMATOLOGY/ONCOLOGY CLINIC NOTE  Date of Service: 09/04/22    Patient Care Team: [name], MD as PCP - General (Family Medicine) Transplant team at [facility] Dr. 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 He received 2 cycles of R-CHOP after which treatment was switched to dose adjusted R-EPOCH for the remaining 4 cycles due to the double expressor status. Interval PET CT scan after 3 cycles on 09/21/21 showed a complete response (Deauville 2). He completed 6 total cycles of treatment on 11/13/21. 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 He had a end of treatment PET CT scan on 12/14/21 which also showed a complete response. He was placed on surveillance.   RECURRENCE Patient developed recurrent, progressively worsening abdominal pain in 01/2022 and underwent a CT scan on 02/17/22. Results showed interval development of bulky adenopathy in the abdomen, highly concerning for recurrence. PET CT 02/22/22 showed extensive mesenteric adenopathy with SUV max 10.93. He underwent a core needle bx of the mesenteric mass on 02/28/22 and results showed recurrent DLBCL, similar to previous. He was referred to [place] for discussion of treatment options and consideration of CAR-T cell therapy. He received one cycle of R-GDC starting 03/02/22 for disease control prior to leukapheresis.   Second line Lymphodepleting Regimen: Fludarabine 30 mg/m2 IV daily for 3 doses and cyclophosphamide 500 mg/m2 IV daily for 3 doses.(Completed on 04/20/2022)  CAR-T Product: Axicabtagene Ciloleucel / Elliot Dally.04/23/2022  CRS Highest Grade: 3. Manifested as Fever and Hypotension. Last occurrence/resolution: 05/01/2022.  ICANS Highest Grade: None.   WBC  Recover (First Day >1,500): 05/31/2022.  Platelet Recover (First Day >75,000): 05/31/2022 with last platelet transfusion given NA.   HISTORY OF PRESENTING ILLNESS:   Please see previous notes for details on initial presentation  INTERVAL HISTORY  [name] is here for follow-up of his large B-cell lymphoma.  Patient was last seen by me on 06/25/2022 and he complained of gum problems after his therapy and muscle pain which worsens when he walks.   Patient reports he has been doing well without any new medical concerns since our last visit. He notes that he had influenza-A in December 2023, but he did not take Tamiflu. He complains of mild cough during this visit, but it has been improving.  Patient complains of occasional bilateral leg pain/tingling sensation. He describes the pain has dull and gets randomly. He has been taking Advil for the pain as needed. Patient has been exercising.   He denies fever, chills, night sweats, other infection issues, diarrhea, loss of appetite, unexpected weight loss, abdominal pain, chest pain, or leg swelling. Patient notes he gets mild diarrhea when he eats dairy products, which is not new.   He is tolerating IVIG well without any toxicities.   Patient notes he is going to have a discussion with his transplant team regarding which vaccines he can get.   Labs were discussed with the patient. PET scan from 08/01/2022 was discussed as well.    MEDICAL HISTORY:  Past Medical History:  Diagnosis Date   Allergy    Cancer ([place])    skin cancer   Herniated disc    Hx of skin cancer, basal cell    Hyperlipidemia    borderline- no meds    Non Hodgkin's lymphoma ([place])  SURGICAL HISTORY: Past Surgical History:  Procedure Laterality Date   COLONOSCOPY     last 2013   ESOPHAGOGASTRODUODENOSCOPY (EGD) WITH PROPOFOL N/A 06/29/2021   Procedure: ESOPHAGOGASTRODUODENOSCOPY (EGD) WITH PROPOFOL;  Surgeon: Milus Banister, MD;  Location: [facility]  ENDOSCOPY;  Service: Endoscopy;  Laterality: N/A;   EUS N/A 06/29/2021   Procedure: UPPER ENDOSCOPIC ULTRASOUND (EUS) RADIAL;  Surgeon: Milus Banister, MD;  Location: [facility] ENDOSCOPY;  Service: Endoscopy;  Laterality: N/A;   FINE NEEDLE ASPIRATION N/A 06/29/2021   Procedure: FINE NEEDLE ASPIRATION (FNA) LINEAR;  Surgeon: Milus Banister, MD;  Location: [facility] ENDOSCOPY;  Service: Endoscopy;  Laterality: N/A;   IR IMAGING GUIDED PORT INSERTION  07/26/2021   VASECTOMY      SOCIAL HISTORY: Social History   Socioeconomic History   Marital status: Married    Spouse name: Not on file   Number of children: 1   Years of education: Not on file   Highest education level: Not on file  Occupational History   Occupation: Sales   Tobacco Use   Smoking status: Never   Smokeless tobacco: Never  Substance and Sexual Activity   Alcohol use: Not Currently    Alcohol/week: 1.0 standard drink of alcohol    Types: 1 Glasses of wine per week    Comment: Rare    Drug use: No   Sexual activity: Not on file  Other Topics Concern   Not on file  Social History Narrative   Daily caffeine    Social Determinants of Health   Financial Resource Strain: Not on file  Food Insecurity: Not on file  Transportation Needs: Not on file  Physical Activity: Not on file  Stress: Not on file  Social Connections: Not on file  Intimate Partner Violence: Not on file    FAMILY HISTORY: Family History  Problem Relation Age of Onset   Colon cancer Mother 68   Colon polyps Mother    Colon cancer Paternal Grandfather    Alzheimer's disease Paternal Grandfather    Breast cancer Maternal Aunt    Diabetes Maternal Grandmother    Prostate cancer Maternal Grandfather    Esophageal cancer Neg Hx    Rectal cancer Neg Hx    Stomach cancer Neg Hx     ALLERGIES:  is allergic to bee pollen-1000-royal jelly [nutritional supplements], gluten meal, and lactose intolerance (gi).  MEDICATIONS:  Current Outpatient Medications   Medication Sig Dispense Refill   acetaminophen (TYLENOL) 325 MG tablet Take 2 tablets (650 mg total) by mouth every 4 (four) hours as needed for mild pain.     Ascorbic Acid (VITAMIN C PO) Take 1 tablet by mouth 3 (three) times a week.     b complex vitamins capsule Take 1 capsule by mouth 3 (three) times a week.     Cholecalciferol (VITAMIN D-3 PO) Take 1 tablet by mouth 3 (three) times a week.     Cyanocobalamin (B-12) 5000 MCG SUBL Take 1 tablet by mouth 2 (two) times daily.     diphenhydrAMINE (BENADRYL) 25 MG tablet Take 25 mg by mouth at bedtime as needed (seasonal allergies).     fluconazole (DIFLUCAN) 200 MG tablet Take 400 mg by mouth once.     ibuprofen (ADVIL) 200 MG tablet Take 400 mg by mouth every 6 (six) hours as needed (pain).     levofloxacin (LEVAQUIN) 500 MG tablet Take 1 tablet by mouth daily.     lidocaine-prilocaine (EMLA) cream Apply to affected area once (Patient taking  differently: Apply 1 application  topically daily as needed (prior to port access).) 30 g 3   loratadine (CLARITIN) 10 MG tablet Take 10 mg by mouth daily as needed (seasonal allergies).     LORazepam (ATIVAN) 0.5 MG tablet Take 1 tablet (0.5 mg total) by mouth every 6 (six) hours as needed (Nausea or vomiting). 30 tablet 0   methylcellulose (CITRUCEL) oral powder Take 1 packet by mouth daily.     Naltrexone HCl, Pain, 4.5 MG CAPS Take 1 tablet by mouth at bedtime.     ondansetron (ZOFRAN) 8 MG tablet Take 1 tablet (8 mg total) by mouth 2 (two) times daily as needed for refractory nausea / vomiting. Start on day 3 after cyclophosphamide chemotherapy. 30 tablet 1   pantoprazole (PROTONIX) 40 MG tablet Take 1 tablet (40 mg total) by mouth daily before breakfast. (Patient taking differently: Take 40 mg by mouth daily as needed (heartburn).) 30 tablet 2   polyethylene glycol (MIRALAX / GLYCOLAX) 17 g packet Take 17 g by mouth daily as needed (constipation).     predniSONE (DELTASONE) 20 MG tablet Take 1 tablet  (20 mg total) by mouth as needed. 60 tablet 0   senna-docusate (SENOKOT-S) 8.6-50 MG tablet Take 1 tablet by mouth at bedtime as needed for mild constipation.     sulfamethoxazole-trimethoprim (BACTRIM DS) 800-160 MG tablet Take 1 tablet by mouth 3 (three) times a week.     Tetrahydrozoline HCl (VISINE OP) Place 1 drop into both eyes daily as needed (dry eyes).     valACYclovir (VALTREX) 500 MG tablet Take 500 mg by mouth 2 (two) times daily.     No current facility-administered medications for this visit.    REVIEW OF SYSTEMS:   10 Point review of Systems was done is negative except as noted above.   PHYSICAL EXAMINATION: ECOG PERFORMANCE STATUS: 1 - Symptomatic but completely ambulatory  Vitals:   09/04/22 0848  BP: 106/71  Pulse: 74  Resp: 20  Temp: (!) 97.3 F (36.3 C)  SpO2: 100%     Filed Weights   09/04/22 0848  Weight: 181 lb 9.6 oz (82.4 kg)     .Body mass index is 23.96 kg/m. [name]  NAD GENERAL:alert, in no acute distress and comfortable SKIN: no acute rashes, no significant lesions, hair is starting to regrow EYES: conjunctiva are pink and non-injected, sclera anicteric OROPHARYNX: MMM, no exudates, no oropharyngeal erythema or ulceration NECK: supple, no JVD LYMPH:  no palpable lymphadenopathy in the cervical, axillary or inguinal regions LUNGS: clear to auscultation b/l with normal respiratory effort HEART: regular rate & rhythm ABDOMEN:  normoactive bowel sounds , non tender, not distended.  No palpable hepatosplenomegaly Extremity: no pedal edema PSYCH: alert & oriented x 3 with fluent speech NEURO: no focal motor/sensory deficits  LABORATORY DATA:   I have reviewed the data as listed     Latest Ref Rng & Units 09/04/2022    8:38 AM 08/07/2022    7:46 AM 07/03/2022    7:48 AM  CBC  WBC 4.0 - 10.5 K/uL 3.3  3.0  2.3   Hemoglobin 13.0 - 17.0 g/dL 12.8  11.8  10.8   Hematocrit 39.0 - 52.0 % 36.3  33.7  31.5   Platelets 150 - 400 K/uL 213  177  182     .Marland KitchenCBC    Component Value Date/Time   WBC 3.3 (L) 09/04/2022 0838   WBC 7.9 11/17/2021 0550   RBC 3.73 (L) 09/04/2022 0932  HGB 12.8 (L) 09/04/2022 0838   HCT 36.3 (L) 09/04/2022 0838   PLT 213 09/04/2022 0838   MCV 97.3 09/04/2022 0838   MCH 34.3 (H) 09/04/2022 0838   MCHC 35.3 09/04/2022 0838   RDW 12.1 09/04/2022 0838   LYMPHSABS 0.8 09/04/2022 0838   MONOABS 0.2 09/04/2022 0838   EOSABS 0.1 09/04/2022 0838   BASOSABS 0.0 09/04/2022 0838   ANC 800 .    Latest Ref Rng & Units 09/04/2022    8:38 AM 08/07/2022    7:46 AM 07/03/2022    7:48 AM  CMP  Glucose 70 - 99 mg/dL 151  98  96   BUN 6 - 20 mg/dL '10  12  10   '$ Creatinine 0.61 - 1.24 mg/dL 0.88  0.82  0.77   Sodium 135 - 145 mmol/L 138  139  139   Potassium 3.5 - 5.1 mmol/L 4.2  4.0  3.8   Chloride 98 - 111 mmol/L 104  106  107   CO2 22 - 32 mmol/L '25  26  25   '$ Calcium 8.9 - 10.3 mg/dL 9.7  9.5  9.6   Total Protein 6.5 - 8.1 g/dL 6.6  6.7  5.9   Total Bilirubin 0.3 - 1.2 mg/dL 0.4  0.3  0.3   Alkaline Phos 38 - 126 U/L 43  34  24   AST 15 - 41 U/L '14  12  13   '$ ALT 0 - 44 U/L '8  7  7    '$ MAGNESIUM 2 . Lab Results  Component Value Date   LDH 114 09/04/2022   SURGICAL PATHOLOGY  CASE: [id number]  PATIENT: [name]  Surgical Pathology Report   Specimen Submitted:  A. Lymph node, right neck   Clinical History: History of malignant non Hodgkin's lymphoma after EUS  biopsy on 11/17, however sample was insufficient to subclassify for  treatment planning.  Now post US guided bx of hypermetabolic right  supraclavicular lymph node   DIAGNOSIS:  A. LYMPH NODE, RIGHT NECK; ULTRASOUND-GUIDED BIOPSY:  - LARGE B-CELL LYMPHOMA.  - SEE COMMENT.   Comment:  Sections demonstrate sheets of large, markedly atypical cells with  prominent macronucleoli.  Mitotic activity is brisk.  Flow cytometry  demonstrates a CD5 negative, CD10 negative monoclonal B cell population.  Definitive classification is  dependent on prognostically significant IHC  and FISH studies, which will be reported in an addendum.      RADIOGRAPHIC STUDIES: I have personally reviewed the radiological images as listed and agreed with the findings in the report.  PET scan 05/23/2022: FINDINGS: DISEASE-RELATED FINDINGS:   Relative to prior PET/CT dated 04/11/2022, there is significant interval decrease in size and FDG avidity of the previously described multifocal central mesenteric and retroperitoneal lymphadenopathy. For reference, there is a left para-aortic lymph node (image 215) now measuring approximately 1 x 0.7 cm with SUV max 1.3, previously measuring 2 x 1.8 cm with SUV max 5.3 and a left peripancreatic lymph node (image 196) measuring approximately 1.6 x 0.9 cm, with SUV max 1.7, previously measuring 2.7 x 2.4 cm with SUV max 3.1. Although comparison of measurements of the previously described largest nodal conglomerate is difficult due to the prior multilobulated morphology, there is marked interval decrease in size with residual lesions measuring approximately 2 x 1.5 cm, with SUV max 2.7 (image 228) and 1.6 x 0.7 cm with SUV max 1.8 (image 229).   Interval decrease in FDG avidity of the spleen, which is now below  liver, with SUV max 2, previously 2.8. Similar craniocaudal dimension of the spleen, measuring approximately 8 cm.  OTHER TRACER-RELATED FINDINGS:   Interval decrease of diffuse FDG activity involving the bone marrow. No osseus foci of increased FDG uptake are identified.   Focus of increased FDG uptake along the left retromolar trigone (image 45), without CT correlate likely odontogenic.   Asymmetric increased FDG uptake along the right posterior larynx is favored physiologic.   Expected physiologic activity within the kidneys, ureter, bladder, oropharynx, salivary glands, stomach, bowel and brain.  ANCILLARY CT FINDINGS:   Similar left internal jugular portacatheter with the tip terminating in the  right atrium.   Interval decrease in size of the pancreas without increased FDG uptake.   Polyarticular degenerative changes.  CONCLUSION:  1.  Deauville 2 as demonstrated by a significant interval decrease in size and FDG avidity of the previously described lymphadenopathy with a few residual mesenteric lymph nodes with FDG uptake similar to blood pool. This constitutes complete response by the [facility] classification. 2.  Ancillary CT findings as above. Exam End: 05/23/22 08:34   PET CT scan 05/23/2022 post CAR-T cell therapy day +30 PET LYMPHOMA NON HODGKINS (W/LOW DOSE CT)  Anatomical Region Laterality Modality  Abdomen -- Positron Emission Tomography (PET)   Impression   1.  Deauville 2 as demonstrated by a significant interval decrease in size and FDG avidity of the previously described lymphadenopathy with a few residual mesenteric lymph nodes with FDG uptake similar to blood pool. This constitutes complete response by the [facility] classification. 2.  Ancillary CT findings as above.   ASSESSMENT & PLAN:   Very pleasant 42 year old gentleman with some chronic food related bowel irregularities with  1) Stage IVA large B-cell lymphoma NOS.  Double expressor ABC type non-germinal center (activated B cell) immunophenotype. Co-expression of BCL2 and C-MYC by IHC may be associated with a more aggressive clinical course.   Immunohistochemical studies demonstrate the tumor cells to be positive  for CD20, BCL2, BCL6, MUM1, and C-MYC, and negative for CD5, CD10, CD30,  and EBER ISH.   non-germinal center (activated B cell) immunophenotype.  Co-expression of BCL2 and C-MYC by IHC may be associated with a more aggressive clinical course.   Lymphoma FISH panel shows no evidence of c-Myc rearrangement ruling out double hit or triple hit lymphoma. Patient presented with extensive abdominal and retroperitoneal lymphadenopathy, right supraclavicular lymphadenopathy and lung nodules concerning  for lymphoma involvement as well as concern for small bowel involvement. 2) pulmonary nodules concerning for lymphoma involvement 3) 4.4 cm small bowel thickening concerning for involvement by lymphoma.  Cannot rule out adenocarcinoma but less likely. 4) acute kidney injury.  Patient's creatinine had bumped to 1.6 but on repeat is back down to normal. 5) Food intolerances including gluten and dairy avoidance. 6) DLBCL s/p CAR-T Therapy  Oncology history:  First-line therapy with R-CHOP starting on 07/27/21. He received 2 cycles of R-CHOP after which treatment was switched to dose adjusted R-EPOCH for the remaining 4 cycles due to the double expressor status. Interval PET CT scan after 3 cycles on 09/21/21 showed a complete response (Deauville 2). He completed 6 total cycles of treatment on 11/13/21. 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 He had a end of treatment PET CT scan on 12/14/21 which also showed a complete response. He was placed on surveillance.   RECURRENCE Patient developed recurrent, progressively worsening abdominal pain in 01/2022 and underwent a CT scan on 02/17/22. Results  showed interval development of bulky adenopathy in the abdomen, highly concerning for recurrence. PET CT 02/22/22 showed extensive mesenteric adenopathy with SUV max 10.93. He underwent a core needle bx of the mesenteric mass on 02/28/22 and results showed recurrent DLBCL, similar to previous. He was referred to [place] for discussion of treatment options and consideration of CAR-T cell therapy. He received one cycle of R-GDC starting 03/02/22 for disease control prior to leukapheresis.   Second line Lymphodepleting Regimen: Fludarabine 30 mg/m2 IV daily for 3 doses and cyclophosphamide 500 mg/m2 IV daily for 3 doses.(Completed on 04/20/2022)  CAR-T Product: Axicabtagene Ciloleucel / Elliot Dally.04/23/2022  CRS Highest Grade: 3. Manifested as Fever and Hypotension. Last occurrence/resolution:  05/01/2022.  ICANS Highest Grade: None.   WBC Recover (First Day >1,500): 05/31/2022.  Platelet Recover (First Day >75,000): 05/31/2022 with last platelet transfusion given NA.   #7 history of BK Viruria Symptoms resolved. Continue adequate hydration.   #8 Prolonged Pancytopenia Continue with Acyclovir 800 mg BID for HSV/VZV. Levofloxacin 500 mg daily for bacterial until [place] consistently >1000. Fluconazole 400 mg daily for fungal until [place] consistently >1000. Bactrim DS 1 tab Monday, Wednesday, Friday for PCP for at least 6 months.   -Discussed neutropenic precautions -Continue to monitor labs -Transplant team considering Nplate and G-CSF if counts do not improve spontaneously. Currently no Nplate needed since platelets are within normal limits ANC is progressively improving and is 800 currently.  Will need ongoing monitoring  #9 Hypogammaglobulinemia with IgG1 subclass deficiency  suggestion  Information displayed in this report may not trend or trigger automated decision support.    Ref Range & Units 8 d ago  IGG 635 - 1741 MG/DL 397 Low   IGM 45 - 281 MG/DL <20 Low   IGA 66 - 433 MG/DL 20 Low     -Patient will be started on IVIG monthly to reduce risk of infections in the context of hypogammaglobinemia and IgG 1 subclass deficiency as per his transplant team recommendations. -We will do first dose with 1 g/kg and based on repeat labs might cut that down to 0.5 g/kg with subsequent doses for maintenance  Prophylaxis  ID Acyclovir 800 mg BID for HSV/VZV. Levofloxacin 500 mg daily for bacterial. Fluconazole 400 mg daily for fungal. Bactrim DS 1 tab Monday, Wednesday, Friday for PCP.   GERD Continue omeprazole 20 mg daily.    Supplement Use Vitamin B complex, Vitamin D3/K2, Vitamin C all ok to resume now post CAR-T.   Anxiety/Anticipatory Nausea Continue lorazepam 0.5 mg every 8 hours as needed.   PLAN -Discussed lab reports from today, 09/04/2022, with the patient. CBC  showed WBC of 3.3 K/uL, slightly decreased hemoglobin of 12.8. CMP stabel. LDH WNL. -Discussed the most recent PET scan from 08/01/2022, which showed  Minimal residual soft tissue thickening/nodularity and FDG avidity within the right eccentric small bowel mesenteric root. No findings to suggest new lymphomatous disease. Findings are consistent with Deauville 3/Complete Response (CR) per the [facility] classification. Right upper lobe peribronchial thickening and tree-in-bud nodularity with associated FDG-avidity. This is favored to reflect infectious/inflammatory etiologies. Recommend attention on follow-up imaging to assess for resolution.  -Answered all of patient's questions.  -Advised the patient to follow up with his transplant team regarding what vaccines the patient can get.  -Continue IVIG every 4 weeks for  6 months for now.   -Recommended to continue to his Vitamin B, Vitamin C, B-Complex and Vitamin D supplements.   Follow-up: IVIG 0.4g/kg every month thereafter x 6 with  labs Has upcoming labs within a month with his transplant team RTC with Dr Irene Limbo with labs in 3 months  The total time spent in the appointment was 30 minutes* .  All of the patient's questions were answered with apparent satisfaction. The patient knows to call the clinic with any problems, questions or concerns.   Sullivan Lone MD MS AAHIVMS [facility] Hematology/Oncology Physician [facility]  .*Total Encounter Time as defined by the Centers for Medicare and Medicaid Services includes, in addition to the face-to-face time of a patient visit (documented in the note above) non-face-to-face time: obtaining and reviewing outside history, ordering and reviewing medications, tests or procedures, care coordination (communications with other health care professionals or caregivers) and documentation in the medical record.   I,Param Shah,acting as a scribe for Sullivan Lone, MD.  .I have reviewed the above  documentation for accuracy and completeness, and I agree with the above. Brunetta Genera MD

## 2022-09-10 ENCOUNTER — Encounter: Payer: Self-pay | Admitting: Hematology

## 2022-10-02 ENCOUNTER — Inpatient Hospital Stay: Payer: 59 | Attending: Hematology

## 2022-10-02 ENCOUNTER — Inpatient Hospital Stay: Payer: 59

## 2022-10-02 VITALS — BP 116/60 | HR 55 | Temp 97.9°F | Resp 18 | Wt 181.8 lb

## 2022-10-02 DIAGNOSIS — E222 Syndrome of inappropriate secretion of antidiuretic hormone: Secondary | ICD-10-CM

## 2022-10-02 DIAGNOSIS — D801 Nonfamilial hypogammaglobulinemia: Secondary | ICD-10-CM | POA: Insufficient documentation

## 2022-10-02 DIAGNOSIS — Z95828 Presence of other vascular implants and grafts: Secondary | ICD-10-CM

## 2022-10-02 DIAGNOSIS — C8580 Other specified types of non-Hodgkin lymphoma, unspecified site: Secondary | ICD-10-CM

## 2022-10-02 DIAGNOSIS — Z7189 Other specified counseling: Secondary | ICD-10-CM

## 2022-10-02 DIAGNOSIS — C8338 Diffuse large B-cell lymphoma, lymph nodes of multiple sites: Secondary | ICD-10-CM

## 2022-10-02 LAB — CMP (CANCER CENTER ONLY)
ALT: 11 U/L (ref 0–44)
AST: 14 U/L — ABNORMAL LOW (ref 15–41)
Albumin: 4.3 g/dL (ref 3.5–5.0)
Alkaline Phosphatase: 43 U/L (ref 38–126)
Anion gap: 6 (ref 5–15)
BUN: 16 mg/dL (ref 6–20)
CO2: 26 mmol/L (ref 22–32)
Calcium: 9.3 mg/dL (ref 8.9–10.3)
Chloride: 106 mmol/L (ref 98–111)
Creatinine: 0.91 mg/dL (ref 0.61–1.24)
GFR, Estimated: >60 mL/min (ref >60)
Glucose, Bld: 139 mg/dL — ABNORMAL HIGH (ref 70–99)
Potassium: 3.9 mmol/L (ref 3.5–5.1)
Sodium: 138 mmol/L (ref 135–145)
Total Bilirubin: 0.4 mg/dL (ref 0.3–1.2)
Total Protein: 6.6 g/dL (ref 6.5–8.1)

## 2022-10-02 LAB — CBC WITH DIFFERENTIAL (CANCER CENTER ONLY)
Abs Immature Granulocytes: 0.01 10*3/uL (ref 0.00–0.07)
Basophils Absolute: 0 10*3/uL (ref 0.0–0.1)
Basophils Relative: 1 %
Eosinophils Absolute: 0.1 10*3/uL (ref 0.0–0.5)
Eosinophils Relative: 2 %
HCT: 36.8 % — ABNORMAL LOW (ref 39.0–52.0)
Hemoglobin: 12.9 g/dL — ABNORMAL LOW (ref 13.0–17.0)
Immature Granulocytes: 0 %
Lymphocytes Relative: 17 %
Lymphs Abs: 0.6 10*3/uL — ABNORMAL LOW (ref 0.7–4.0)
MCH: 33.5 pg (ref 26.0–34.0)
MCHC: 35.1 g/dL (ref 30.0–36.0)
MCV: 95.6 fL (ref 80.0–100.0)
Monocytes Absolute: 0.3 10*3/uL (ref 0.1–1.0)
Monocytes Relative: 8 %
Neutro Abs: 2.5 10*3/uL (ref 1.7–7.7)
Neutrophils Relative %: 72 %
Platelet Count: 191 10*3/uL (ref 150–400)
RBC: 3.85 MIL/uL — ABNORMAL LOW (ref 4.22–5.81)
RDW: 12.6 % (ref 11.5–15.5)
WBC Count: 3.5 10*3/uL — ABNORMAL LOW (ref 4.0–10.5)
nRBC: 0 % (ref 0.0–0.2)

## 2022-10-02 LAB — LACTATE DEHYDROGENASE: LDH: 114 U/L (ref 98–192)

## 2022-10-02 MED ORDER — ACETAMINOPHEN 325 MG PO TABS
650.0000 mg | ORAL_TABLET | Freq: Once | ORAL | Status: AC
  Administered 2022-10-02: 650 mg via ORAL
  Filled 2022-10-02: qty 2

## 2022-10-02 MED ORDER — IMMUNE GLOBULIN (HUMAN) 10 GM/100ML IV SOLN
400.0000 mg/kg | Freq: Once | INTRAVENOUS | Status: AC
  Administered 2022-10-02: 35 g via INTRAVENOUS
  Filled 2022-10-02: qty 350

## 2022-10-02 MED ORDER — SODIUM CHLORIDE 0.9% FLUSH
10.0000 mL | INTRAVENOUS | Status: AC | PRN
  Administered 2022-10-02: 10 mL

## 2022-10-02 MED ORDER — DIPHENHYDRAMINE HCL 25 MG PO CAPS
25.0000 mg | ORAL_CAPSULE | Freq: Once | ORAL | Status: AC
  Administered 2022-10-02: 25 mg via ORAL
  Filled 2022-10-02: qty 1

## 2022-10-02 MED ORDER — FAMOTIDINE IN NACL 20-0.9 MG/50ML-% IV SOLN
20.0000 mg | Freq: Once | INTRAVENOUS | Status: AC
  Administered 2022-10-02: 20 mg via INTRAVENOUS
  Filled 2022-10-02: qty 50

## 2022-10-02 MED ORDER — SODIUM CHLORIDE 0.9% FLUSH
3.0000 mL | Freq: Once | INTRAVENOUS | Status: AC | PRN
  Administered 2022-10-02: 3 mL

## 2022-10-02 MED ORDER — METHYLPREDNISOLONE SODIUM SUCC 125 MG IJ SOLR
60.0000 mg | Freq: Once | INTRAMUSCULAR | Status: AC
  Administered 2022-10-02: 60 mg via INTRAVENOUS
  Filled 2022-10-02: qty 2

## 2022-10-02 MED ORDER — HEPARIN SOD (PORK) LOCK FLUSH 100 UNIT/ML IV SOLN
250.0000 [IU] | Freq: Once | INTRAVENOUS | Status: AC | PRN
  Administered 2022-10-02: 500 [IU]

## 2022-10-02 MED ORDER — DEXTROSE 5 % IV SOLN: INTRAVENOUS | Status: DC

## 2022-10-02 NOTE — Progress Notes (Signed)
Pt tolerated privigen infusion well, vital signs stable at discharge. Pt declined to stay for 30 minute observation, ambulatory at discharge.

## 2022-10-02 NOTE — Patient Instructions (Signed)

## 2022-10-04 ENCOUNTER — Other Ambulatory Visit: Payer: No Typology Code available for payment source

## 2022-10-04 ENCOUNTER — Ambulatory Visit: Payer: No Typology Code available for payment source

## 2022-10-24 ENCOUNTER — Encounter: Payer: Self-pay | Admitting: Hematology

## 2022-10-30 ENCOUNTER — Inpatient Hospital Stay: Payer: 59

## 2022-12-03 ENCOUNTER — Encounter: Payer: Self-pay | Admitting: Hematology

## 2022-12-04 ENCOUNTER — Inpatient Hospital Stay: Payer: 59

## 2022-12-04 ENCOUNTER — Inpatient Hospital Stay: Payer: 59 | Attending: Hematology | Admitting: Hematology

## 2022-12-04 VITALS — BP 104/71 | HR 60 | Temp 97.9°F | Resp 20 | Wt 189.7 lb

## 2022-12-04 DIAGNOSIS — Z95828 Presence of other vascular implants and grafts: Secondary | ICD-10-CM

## 2022-12-04 DIAGNOSIS — C8333 Diffuse large B-cell lymphoma, intra-abdominal lymph nodes: Secondary | ICD-10-CM | POA: Diagnosis present

## 2022-12-04 DIAGNOSIS — Z79899 Other long term (current) drug therapy: Secondary | ICD-10-CM | POA: Insufficient documentation

## 2022-12-04 DIAGNOSIS — Z800 Family history of malignant neoplasm of digestive organs: Secondary | ICD-10-CM | POA: Diagnosis not present

## 2022-12-04 DIAGNOSIS — D801 Nonfamilial hypogammaglobulinemia: Secondary | ICD-10-CM

## 2022-12-04 DIAGNOSIS — C8580 Other specified types of non-Hodgkin lymphoma, unspecified site: Secondary | ICD-10-CM | POA: Diagnosis not present

## 2022-12-04 DIAGNOSIS — Z803 Family history of malignant neoplasm of breast: Secondary | ICD-10-CM | POA: Insufficient documentation

## 2022-12-04 DIAGNOSIS — Z833 Family history of diabetes mellitus: Secondary | ICD-10-CM | POA: Diagnosis not present

## 2022-12-04 DIAGNOSIS — Z8042 Family history of malignant neoplasm of prostate: Secondary | ICD-10-CM | POA: Diagnosis not present

## 2022-12-04 DIAGNOSIS — Z820 Family history of epilepsy and other diseases of the nervous system: Secondary | ICD-10-CM | POA: Diagnosis not present

## 2022-12-04 DIAGNOSIS — Z83719 Family history of colon polyps, unspecified: Secondary | ICD-10-CM | POA: Insufficient documentation

## 2022-12-04 DIAGNOSIS — C8338 Diffuse large B-cell lymphoma, lymph nodes of multiple sites: Secondary | ICD-10-CM

## 2022-12-04 LAB — CBC WITH DIFFERENTIAL (CANCER CENTER ONLY)
Abs Immature Granulocytes: 0.01 10*3/uL (ref 0.00–0.07)
Basophils Absolute: 0 10*3/uL (ref 0.0–0.1)
Basophils Relative: 1 %
Eosinophils Absolute: 0.1 10*3/uL (ref 0.0–0.5)
Eosinophils Relative: 2 %
HCT: 41 % (ref 39.0–52.0)
Hemoglobin: 14.1 g/dL (ref 13.0–17.0)
Immature Granulocytes: 0 %
Lymphocytes Relative: 30 %
Lymphs Abs: 1.4 10*3/uL (ref 0.7–4.0)
MCH: 32.9 pg (ref 26.0–34.0)
MCHC: 34.4 g/dL (ref 30.0–36.0)
MCV: 95.6 fL (ref 80.0–100.0)
Monocytes Absolute: 0.4 10*3/uL (ref 0.1–1.0)
Monocytes Relative: 8 %
Neutro Abs: 2.7 10*3/uL (ref 1.7–7.7)
Neutrophils Relative %: 59 %
Platelet Count: 198 10*3/uL (ref 150–400)
RBC: 4.29 MIL/uL (ref 4.22–5.81)
RDW: 13.1 % (ref 11.5–15.5)
WBC Count: 4.6 10*3/uL (ref 4.0–10.5)
nRBC: 0 % (ref 0.0–0.2)

## 2022-12-04 LAB — CMP (CANCER CENTER ONLY)
ALT: 16 U/L (ref 0–44)
AST: 15 U/L (ref 15–41)
Albumin: 4.6 g/dL (ref 3.5–5.0)
Alkaline Phosphatase: 49 U/L (ref 38–126)
Anion gap: 6 (ref 5–15)
BUN: 14 mg/dL (ref 6–20)
CO2: 28 mmol/L (ref 22–32)
Calcium: 10.1 mg/dL (ref 8.9–10.3)
Chloride: 104 mmol/L (ref 98–111)
Creatinine: 0.88 mg/dL (ref 0.61–1.24)
GFR, Estimated: >60 mL/min (ref >60)
Glucose, Bld: 102 mg/dL — ABNORMAL HIGH (ref 70–99)
Potassium: 4.1 mmol/L (ref 3.5–5.1)
Sodium: 138 mmol/L (ref 135–145)
Total Bilirubin: 0.3 mg/dL (ref 0.3–1.2)
Total Protein: 6.8 g/dL (ref 6.5–8.1)

## 2022-12-04 LAB — LACTATE DEHYDROGENASE: LDH: 110 U/L (ref 98–192)

## 2022-12-04 MED ORDER — HEPARIN SOD (PORK) LOCK FLUSH 100 UNIT/ML IV SOLN
500.0000 [IU] | INTRAVENOUS | Status: AC | PRN
  Administered 2022-12-04: 500 [IU]

## 2022-12-04 MED ORDER — SODIUM CHLORIDE 0.9% FLUSH
10.0000 mL | INTRAVENOUS | Status: AC | PRN
  Administered 2022-12-04: 10 mL

## 2022-12-04 NOTE — Progress Notes (Signed)
HEMATOLOGY/ONCOLOGY CLINIC NOTE  Date of Service: 12/04/22    Patient Care Team: Allen Kind, MD as PCP - General (Family Medicine) Transplant team at [facility] Dr. 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 He received 2 cycles of R-CHOP after which treatment was switched to dose adjusted R-EPOCH for the remaining 4 cycles due to the double expressor status. Interval PET CT scan after 3 cycles on 09/21/21 showed a complete response (Deauville 2). He completed 6 total cycles of treatment on 11/13/21. 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 He had a end of treatment PET CT scan on 12/14/21 which also showed a complete response. He was placed on surveillance.   RECURRENCE Patient developed recurrent, progressively worsening abdominal pain in 01/2022 and underwent a CT scan on 02/17/22. Results showed interval development of bulky adenopathy in the abdomen, highly concerning for recurrence. PET CT 02/22/22 showed extensive mesenteric adenopathy with SUV max 10.93. He underwent a core needle bx of the mesenteric mass on 02/28/22 and results showed recurrent DLBCL, similar to previous. He was referred to [place] for discussion of treatment options and consideration of CAR-T cell therapy. He received one cycle of R-GDC starting 03/02/22 for disease control prior to leukapheresis.   Second line therapy CAR-T CELL THERAPY - Leukapheresis 03/26/22.  - Bridging: R-ICE x 1 cycle 03/28/22  - Lymphodepletion: Fludarabine 30 mg/m2 IV daily for 3 doses, cyclophosphamide 500 mg/m2 IV daily for 3 doses. (9/6 - 04/20/22) - CAR -T cell infusion: Axi-Cel Mont Dutton) given 04/23/22 - CRS Highest Grade: 3. Manifested as Fever and Hypotension. Last  occurrence/resolution: 05/01/2022. - ICANS Highest Grade: None.  - WBC Recovery (First Day >1,500): 05/31/2022. - Platelet Recovery (First Day >75,000): 05/31/2022 with last platelet transfusion given NA.   HISTORY OF PRESENTING ILLNESS:  Please see previous notes for details on initial presentation   INTERVAL HISTORY: Mr Allen Hodge is here for follow-up of his large B-cell lymphoma.  Patient was last seen by me on 09/04/22 and was doing well without any new concerns at that time. He did have flu A in 07/2022 for which he took Tamiflu with improvement apart from a mild residual cough. He noted some occasional bilateral leg pain/tingling that occurred at random and improved with Advil.  He has since seen hem onc Dr. Irena Cords on 10/24/22. He states that he is planning to get a repeat scan with Dr Alphonsa Gin in 04/2023.  Today, he states that he has been doing well with great energy levels. He denies any recent infections or other new problems. He notes having occasional abnormal "phantom" sensations in his abdomen but denies any pain. He reports that he is eating well. He is tolerating IVIG well without any toxicities.  He would like to have his port removed.   MEDICAL HISTORY:  Past Medical History:  Diagnosis Date   Allergy    Cancer (HCC)    skin cancer   Herniated disc    Hx of skin cancer, basal cell    Hyperlipidemia    borderline- no meds    Non Hodgkin's lymphoma (HCC)     SURGICAL HISTORY: Past Surgical History:  Procedure Laterality Date   COLONOSCOPY     last 2013   ESOPHAGOGASTRODUODENOSCOPY (EGD) WITH PROPOFOL N/A 06/29/2021  Procedure: ESOPHAGOGASTRODUODENOSCOPY (EGD) WITH PROPOFOL;  Surgeon: Rachael Fee, MD;  Location: [facility];  Service: Endoscopy;  Laterality: N/A;   EUS N/A 06/29/2021   Procedure: UPPER ENDOSCOPIC ULTRASOUND (EUS) RADIAL;  Surgeon: Rachael Fee, MD;  Location: [facility];  Service: Endoscopy;  Laterality: N/A;   FINE  NEEDLE ASPIRATION N/A 06/29/2021   Procedure: FINE NEEDLE ASPIRATION (FNA) LINEAR;  Surgeon: Rachael Fee, MD;  Location: [facility];  Service: Endoscopy;  Laterality: N/A;   IR IMAGING GUIDED PORT INSERTION  07/26/2021   VASECTOMY      SOCIAL HISTORY: Social History   Socioeconomic History   Marital status: Married    Spouse name: Not on file   Number of children: 1   Years of education: Not on file   Highest education level: Not on file  Occupational History   Occupation: Sales   Tobacco Use   Smoking status: Never   Smokeless tobacco: Never  Substance and Sexual Activity   Alcohol use: Not Currently    Alcohol/week: 1.0 standard drink of alcohol    Types: 1 Glasses of wine per week    Comment: Rare    Drug use: No   Sexual activity: Not on file  Other Topics Concern   Not on file  Social History Narrative   Daily caffeine    Social Determinants of Health   Financial Resource Strain: Not on file  Food Insecurity: Not on file  Transportation Needs: Not on file  Physical Activity: Not on file  Stress: Not on file  Social Connections: Not on file  Intimate Partner Violence: Not on file    FAMILY HISTORY: Family History  Problem Relation Age of Onset   Colon cancer Mother 56   Colon polyps Mother    Colon cancer Paternal Grandfather    Alzheimer's disease Paternal Grandfather    Breast cancer Maternal Aunt    Diabetes Maternal Grandmother    Prostate cancer Maternal Grandfather    Esophageal cancer Neg Hx    Rectal cancer Neg Hx    Stomach cancer Neg Hx     ALLERGIES:  is allergic to bee pollen-1000-royal jelly [nutritional supplements], gluten meal, and lactose intolerance (gi).  MEDICATIONS:  Current Outpatient Medications  Medication Sig Dispense Refill   acetaminophen (TYLENOL) 325 MG tablet Take 2 tablets (650 mg total) by mouth every 4 (four) hours as needed for mild pain.     Ascorbic Acid (VITAMIN C PO) Take 1 tablet by mouth 3 (three) times  a week.     b complex vitamins capsule Take 1 capsule by mouth 3 (three) times a week.     Cholecalciferol (VITAMIN D-3 PO) Take 1 tablet by mouth 3 (three) times a week.     Cyanocobalamin (B-12) 5000 MCG SUBL Take 1 tablet by mouth 2 (two) times daily.     diphenhydrAMINE (BENADRYL) 25 MG tablet Take 25 mg by mouth at bedtime as needed (seasonal allergies).     fluconazole (DIFLUCAN) 200 MG tablet Take 400 mg by mouth once.     ibuprofen (ADVIL) 200 MG tablet Take 400 mg by mouth every 6 (six) hours as needed (pain).     levofloxacin (LEVAQUIN) 500 MG tablet Take 1 tablet by mouth daily.     lidocaine-prilocaine (EMLA) cream Apply to affected area once (Patient taking differently: Apply 1 application  topically daily as needed (prior to port access).) 30 g 3   loratadine (CLARITIN) 10 MG tablet Take 10 mg by mouth  daily as needed (seasonal allergies).     LORazepam (ATIVAN) 0.5 MG tablet Take 1 tablet (0.5 mg total) by mouth every 6 (six) hours as needed (Nausea or vomiting). 30 tablet 0   methylcellulose (CITRUCEL) oral powder Take 1 packet by mouth daily.     Naltrexone HCl, Pain, 4.5 MG CAPS Take 1 tablet by mouth at bedtime.     ondansetron (ZOFRAN) 8 MG tablet Take 1 tablet (8 mg total) by mouth 2 (two) times daily as needed for refractory nausea / vomiting. Start on day 3 after cyclophosphamide chemotherapy. 30 tablet 1   pantoprazole (PROTONIX) 40 MG tablet Take 1 tablet (40 mg total) by mouth daily before breakfast. (Patient taking differently: Take 40 mg by mouth daily as needed (heartburn).) 30 tablet 2   polyethylene glycol (MIRALAX / GLYCOLAX) 17 g packet Take 17 g by mouth daily as needed (constipation).     predniSONE (DELTASONE) 20 MG tablet Take 1 tablet (20 mg total) by mouth as needed. 60 tablet 0   senna-docusate (SENOKOT-S) 8.6-50 MG tablet Take 1 tablet by mouth at bedtime as needed for mild constipation.     sulfamethoxazole-trimethoprim (BACTRIM DS) 800-160 MG tablet Take 1  tablet by mouth 3 (three) times a week.     Tetrahydrozoline HCl (VISINE OP) Place 1 drop into both eyes daily as needed (dry eyes).     valACYclovir (VALTREX) 500 MG tablet Take 500 mg by mouth 2 (two) times daily.     No current facility-administered medications for this visit.    REVIEW OF SYSTEMS:   10 Point review of Systems was done is negative except as noted above.   PHYSICAL EXAMINATION: ECOG PERFORMANCE STATUS: 1 - Symptomatic but completely ambulatory  Vitals:   12/04/22 0915  BP: 104/71  Pulse: 60  Resp: 20  Temp: 97.9 F (36.6 C)  SpO2: 100%   Filed Weights   12/04/22 0915  Weight: 189 lb 11.2 oz (86 kg)  Body mass index is 25.03 kg/m.  GENERAL:alert, in no acute distress and comfortable SKIN: no acute rashes, no significant lesions EYES: conjunctiva are pink and non-injected, sclera anicteric OROPHARYNX: MMM, no exudates, no oropharyngeal erythema or ulceration NECK: supple, no JVD LYMPH:  no palpable lymphadenopathy in the cervical, axillary or inguinal regions LUNGS: clear to auscultation b/l with normal respiratory effort HEART: regular rate & rhythm ABDOMEN:  normoactive bowel sounds , non tender, not distended.  No palpable hepatosplenomegaly Extremity: no pedal edema PSYCH: alert & oriented x 3 with fluent speech NEURO: no focal motor/sensory deficits  LABORATORY DATA:  I have reviewed the data as listed  Component Ref Range & Units 10/24/2022  Immunoglobulin A, Quantitative (IgA) 66 - 433 mg/dL 24 Low   Immunoglobulin G, Quantitative (IgG) 635 - 1741 mg/dL 098  Immunoglobulin M, Quantitative (IgM) 45 - 281 mg/dL 23 Low      Component Ref Range & Units 10/24/2022  Lactate Dehydrogenase (LDH) 140 - 271 U/L 151       Latest Ref Rng & Units 12/04/2022    8:56 AM 10/02/2022    7:47 AM 09/04/2022    8:38 AM  CBC  WBC 4.0 - 10.5 K/uL 4.6  3.5  3.3   Hemoglobin 13.0 - 17.0 g/dL 11.9  14.7  82.9   Hematocrit 39.0 - 52.0 % 41.0  36.8  36.3    Platelets 150 - 400 K/uL 198  191  213       Latest Ref Rng & Units 12/04/2022  8:56 AM 10/02/2022    7:47 AM 09/04/2022    8:38 AM  CMP  Glucose 70 - 99 mg/dL 161  096  045   BUN 6 - 20 mg/dL Creatinine 0.61 - 1.24 mg/dL 4.09  8.11  9.14   Sodium 135 - 145 mmol/L 138  138  138   Potassium 3.5 - 5.1 mmol/L 4.1  3.9  4.2   Chloride 98 - 111 mmol/L 104  106  104   CO2 22 - 32 mmol/L Calcium 8.9 - 10.3 mg/dL 78.2  9.3  9.7   Total Protein 6.5 - 8.1 g/dL 6.8  6.6  6.6   Total Bilirubin 0.3 - 1.2 mg/dL 0.3  0.4  0.4   Alkaline Phos 38 - 126 U/L 49  43  43   AST 15 - 41 U/L ALT 0 - 44 U/L Lab Results  Component Value Date   LDH 114 10/02/2022   PATHOLOGY 02/28/2022 A. Lymph node  Clinical History: Concern for recurrent lymphoma, post CT guided BX of  bulky abdominal nodal conglomeration  DIAGNOSIS:  A. LYMPH NODE, ABDOMINAL; CT-GUIDED CORE NEEDLE BIOPSY:  - DIFFUSE LARGE B-CELL LYMPHOMA, SIMILAR TO PREVIOUS.  Comment:  This case was reviewed together with the prior diagnostic biopsy  ([id number]).  Concurrent flow cytometric studies identify a  CD5-negative, CD10-negative clonal B cell population, large in size. For  further details, see scanned report in [facility]. Additional  immunohistochemical testing is deferred in favor of saving limited  tissue for potential ancillary testing.    SURGICAL PATHOLOGY 07/19/2021 A. Lymph node, right neck  Clinical History: History of malignant non Hodgkin's lymphoma after EUS  biopsy on 11/17, however sample was insufficient to subclassify for  treatment planning.  Now post US guided bx of hypermetabolic right  supraclavicular lymph node  DIAGNOSIS:  A. LYMPH NODE, RIGHT NECK; ULTRASOUND-GUIDED BIOPSY:  - LARGE B-CELL LYMPHOMA.  - SEE COMMENT.  Comment:  Sections demonstrate sheets of large, markedly atypical cells with  prominent macronucleoli.  Mitotic activity is brisk.  Flow  cytometry  demonstrates a CD5 negative, CD10 negative monoclonal B cell population.  Definitive classification is dependent on prognostically significant IHC  and FISH studies, which will be reported in an addendum.      RADIOGRAPHIC STUDIES: I have personally reviewed the radiological images as listed and agreed with the findings in the report.  No results found.  PET scan 05/23/2022: CONCLUSION: 1.  Deauville 2 as demonstrated by a significant interval decrease in size and FDG avidity of the previously described lymphadenopathy with a few residual mesenteric lymph nodes with FDG uptake similar to blood pool. This constitutes complete response by the [facility] classification. 2.  Ancillary CT findings as above.   PET CT scan 05/23/2022 post CAR-T cell therapy day +30 PET LYMPHOMA NON HODGKINS (W/LOW DOSE CT) Anatomical Region Laterality Modality  Abdomen -- Positron Emission Tomography (PET)  Impression 1.  Deauville 2 as demonstrated by a significant interval decrease in size and FDG avidity of the previously described lymphadenopathy with a few residual mesenteric lymph nodes with FDG uptake similar to blood pool. This constitutes complete response by the [facility] classification. 2.  Ancillary CT findings as above.   ASSESSMENT & PLAN:   Very pleasant 42 y.o. gentleman with some chronic food related bowel irregularities with  1)  Stage IVA large B-cell lymphoma NOS.  Double expressor ABC type non-germinal center (activated B cell) immunophenotype. Co-expression of BCL2 and C-MYC by IHC may be associated with a more aggressive clinical course.   Immunohistochemical studies demonstrate the tumor cells to be positive  for CD20, BCL2, BCL6, MUM1, and C-MYC, and negative for CD5, CD10, CD30,  and EBER ISH.   non-germinal center (activated B cell) immunophenotype.  Co-expression of BCL2 and C-MYC by IHC may be associated with a more aggressive clinical course.   Lymphoma FISH panel  shows no evidence of c-Myc rearrangement ruling out double hit or triple hit lymphoma. Patient presented with extensive abdominal and retroperitoneal lymphadenopathy, right supraclavicular lymphadenopathy and lung nodules concerning for lymphoma involvement as well as concern for small bowel involvement.  PET scan from 08/01/2022 showed minimal residual soft tissue thickening/nodularity and FDG avidity within the right eccentric small bowel mesenteric root. No findings to suggest new lymphomatous disease. Findings are consistent with Deauville 3/Complete Response (CR) per the [facility] classification. Right upper lobe peribronchial thickening and tree-in-bud nodularity with associated FDG-avidity. This is favored to reflect infectious/inflammatory etiologies.    2) pulmonary nodules concerning for lymphoma involvement  3) 4.4 cm small bowel thickening concerning for involvement by lymphoma.  Pathology from 02/28/22: A. LYMPH NODE, ABDOMINAL; CT-GUIDED CORE NEEDLE BIOPSY:  - DIFFUSE LARGE B-CELL LYMPHOMA, SIMILAR TO PREVIOUS.   4) acute kidney injury.  Creatinine improved to 0.88 today, 12/04/22.  5) Food intolerances including gluten and dairy avoidance.  6) DLBCL s/p CAR-T Therapy  Oncology history:  First-line therapy with R-CHOP starting on 07/27/21. He received 2 cycles of R-CHOP after which treatment was switched to dose adjusted R-EPOCH for the remaining 4 cycles due to the double expressor status. Interval PET CT scan after 3 cycles on 09/21/21 showed a complete response (Deauville 2). He completed 6 total cycles of treatment on 11/13/21. 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 He had a end of treatment PET CT scan on 12/14/21 which also showed a complete response. He was placed on surveillance.   RECURRENCE Patient developed recurrent, progressively worsening abdominal pain in 01/2022 and underwent a CT scan on 02/17/22. Results showed interval development of bulky  adenopathy in the abdomen, highly concerning for recurrence. PET CT 02/22/22 showed extensive mesenteric adenopathy with SUV max 10.93. He underwent a core needle bx of the mesenteric mass on 02/28/22 and results showed recurrent DLBCL, similar to previous. He was referred to [place] for discussion of treatment options and consideration of CAR-T cell therapy. He received one cycle of R-GDC starting 03/02/22 for disease control prior to leukapheresis.   Second line Lymphodepleting Regimen: Fludarabine 30 mg/m2 IV daily for 3 doses and cyclophosphamide 500 mg/m2 IV daily for 3 doses.(Completed on 04/20/2022) CAR-T Product: Axicabtagene Ciloleucel / Mont Dutton.04/23/2022 CRS Highest Grade: 3. Manifested as Fever and Hypotension. Last occurrence/resolution: 05/01/2022. ICANS Highest Grade: None.  WBC Recover (First Day >1,500): 05/31/2022. Platelet Recover (First Day >75,000): 05/31/2022 with last platelet transfusion given NA.   #7 history of BK Viruria Symptoms resolved. Continue adequate hydration.   #8 Prolonged Pancytopenia- now resolved. Antimicrobial prophylaxis- per transplant team.  #9 Hypogammaglobulinemia with IgG1 subclass deficiency  suggestion  Information displayed in this report may not trend or trigger automated decision support.    Ref Range & Units 8 d ago  IGG 635 - 1741 MG/DL 161 Low   IGM 45 - 096 MG/DL <04 Low   IGA 66 - 540 MG/DL  20 Low    -Patient on IVIG monthly to reduce risk of infections in the context of hypogammaglobinemia and IgG 1 subclass deficiency as per his transplant team recommendations.   #10 Prophylaxis ID Acyclovir 800 mg BID for HSV/VZV. Levofloxacin 500 mg daily for bacterial. Fluconazole 400 mg daily for fungal. Bactrim DS 1 tab Monday, Wednesday, Friday for PCP.   #11 GERD Continue omeprazole 20 mg daily.    #12 Supplement Use Vitamin B complex, Vitamin D3/K2, Vitamin C all ok to resume now post CAR-T.   #13 Anxiety/Anticipatory Nausea Continue  lorazepam 0.5 mg every 8 hours as needed.    PLAN -Discussed lab reports from today, 12/04/22 as well as his [facility] results from 10/24/22. -On 3/13 labs showed IgA of 24, IgG of 872, and IgM of 23. CMP was WNL. LDH was 151. -CBC and CMP WNL today. LDH wnl 110. - patient has no labs or clinical evidence of lymphoma recurrence at this time. - IgG labs total 680 today as well.No IgG subclass deficiencies noted. Will hold IVIG at this time- discussed with patient. -Answered all of patient's questions.  -Recommended to continue to his Vitamin B, Vitamin C, B-Complex and Vitamin D supplements.  -He is undecided regarding getting vaccine and would like to reconsider this with his transplant team. -Will refer patient for port removal.  Orders Placed This Encounter  Procedures   IgG    Standing Status:   Future    Standing Expiration Date:   12/04/2023   IgG 1, 2, 3, and 4    Standing Status:   Future    Standing Expiration Date:   12/04/2023     Follow-up: RTC with Dr Candise Che with labs in 3 months IR referral for port a cath removal   The total time spent in the appointment was 30 minutes* .  All of the patient's questions were answered with apparent satisfaction. The patient knows to call the clinic with any problems, questions or concerns.  Wyvonnia Lora MD MS AAHIVMS [facility] Hematology/Oncology Physician [facility]  *Total Encounter Time as defined by the Centers for Medicare and Medicaid Services includes, in addition to the face-to-face time of a patient visit (documented in the note above) non-face-to-face time: obtaining and reviewing outside history, ordering and reviewing medications, tests or procedures, care coordination (communications with other health care professionals or caregivers) and documentation in the medical record.   I,Alexis Herring,acting as a Neurosurgeon for Wyvonnia Lora, MD.,have documented all relevant documentation on the behalf of Wyvonnia Lora,  MD,as directed by  Wyvonnia Lora, MD while in the presence of Wyvonnia Lora, MD.  .I have reviewed the above documentation for accuracy and completeness, and I agree with the above. Johney Maine MD

## 2022-12-05 ENCOUNTER — Telehealth: Payer: Self-pay | Admitting: Hematology

## 2022-12-05 LAB — IGG 1, 2, 3, AND 4
IgG (Immunoglobin G), Serum: 680 mg/dL (ref 603–1613)
IgG, Subclass 1: 319 mg/dL (ref 248–810)
IgG, Subclass 2: 279 mg/dL (ref 130–555)
IgG, Subclass 3: 27 mg/dL (ref 15–102)
IgG, Subclass 4: 4 mg/dL (ref 2–96)

## 2022-12-06 ENCOUNTER — Ambulatory Visit (HOSPITAL_COMMUNITY)
Admission: RE | Admit: 2022-12-06 | Discharge: 2022-12-06 | Disposition: A | Discharge status: Home / Self Care | Payer: 59 | Source: Ambulatory Visit | Attending: Hematology | Admitting: Hematology

## 2022-12-06 DIAGNOSIS — Z452 Encounter for adjustment and management of vascular access device: Secondary | ICD-10-CM | POA: Diagnosis not present

## 2022-12-06 DIAGNOSIS — C8580 Other specified types of non-Hodgkin lymphoma, unspecified site: Secondary | ICD-10-CM | POA: Insufficient documentation

## 2022-12-06 HISTORY — PX: IR REMOVAL TUN ACCESS W/ PORT W/O FL MOD SED: IMG2290

## 2022-12-06 MED ORDER — LIDOCAINE-EPINEPHRINE 1 %-1:100000 IJ SOLN: 20.0000 mL | Freq: Once | INTRAMUSCULAR | Status: DC

## 2022-12-06 MED ORDER — LIDOCAINE-EPINEPHRINE 1 %-1:100000 IJ SOLN
INTRAMUSCULAR | Status: AC
  Filled 2022-12-06: qty 1

## 2022-12-06 NOTE — Procedures (Signed)
Pre Procedural Dx: Poor venous access Post Procedural Dx: Same  Successful removal of anterior chest wall port-a-cath.  EBL: Trace No immediate post procedural complications.   Jay Aztlan Coll, MD Pager #: [phone]  

## 2022-12-10 ENCOUNTER — Encounter: Payer: Self-pay | Admitting: Hematology

## 2022-12-28 ENCOUNTER — Encounter: Payer: Self-pay | Admitting: Hematology

## 2023-01-01 ENCOUNTER — Inpatient Hospital Stay: Payer: 59

## 2023-01-29 ENCOUNTER — Ambulatory Visit: Payer: 59

## 2023-01-29 ENCOUNTER — Other Ambulatory Visit: Payer: 59

## 2023-02-11 ENCOUNTER — Telehealth: Payer: Self-pay | Admitting: Hematology

## 2023-02-11 NOTE — Telephone Encounter (Signed)
Patient Is aware of upcoming appointment times/dates

## 2023-02-26 ENCOUNTER — Other Ambulatory Visit: Payer: 59

## 2023-02-26 ENCOUNTER — Ambulatory Visit: Payer: 59

## 2023-03-08 ENCOUNTER — Ambulatory Visit: Payer: 59 | Admitting: Hematology

## 2023-03-08 ENCOUNTER — Other Ambulatory Visit: Payer: 59

## 2023-03-28 ENCOUNTER — Other Ambulatory Visit: Payer: Self-pay

## 2023-03-28 ENCOUNTER — Inpatient Hospital Stay: Payer: 59 | Attending: Hematology

## 2023-03-28 ENCOUNTER — Inpatient Hospital Stay: Payer: 59 | Admitting: Hematology

## 2023-03-28 VITALS — BP 101/79 | HR 82 | Temp 98.2°F | Resp 20 | Wt 186.6 lb

## 2023-03-28 DIAGNOSIS — Z8572 Personal history of non-Hodgkin lymphomas: Secondary | ICD-10-CM | POA: Diagnosis present

## 2023-03-28 DIAGNOSIS — C8338 Diffuse large B-cell lymphoma, lymph nodes of multiple sites: Secondary | ICD-10-CM

## 2023-03-28 DIAGNOSIS — D801 Nonfamilial hypogammaglobulinemia: Secondary | ICD-10-CM | POA: Diagnosis present

## 2023-03-28 LAB — CBC WITH DIFFERENTIAL (CANCER CENTER ONLY)
Abs Immature Granulocytes: 0 10*3/uL (ref 0.00–0.07)
Basophils Absolute: 0 10*3/uL (ref 0.0–0.1)
Basophils Relative: 1 %
Eosinophils Absolute: 0.1 10*3/uL (ref 0.0–0.5)
Eosinophils Relative: 1 %
HCT: 41.4 % (ref 39.0–52.0)
Hemoglobin: 14.2 g/dL (ref 13.0–17.0)
Immature Granulocytes: 0 %
Lymphocytes Relative: 30 %
Lymphs Abs: 1.6 10*3/uL (ref 0.7–4.0)
MCH: 32 pg (ref 26.0–34.0)
MCHC: 34.3 g/dL (ref 30.0–36.0)
MCV: 93.2 fL (ref 80.0–100.0)
Monocytes Absolute: 0.3 10*3/uL (ref 0.1–1.0)
Monocytes Relative: 6 %
Neutro Abs: 3.5 10*3/uL (ref 1.7–7.7)
Neutrophils Relative %: 62 %
Platelet Count: 204 10*3/uL (ref 150–400)
RBC: 4.44 MIL/uL (ref 4.22–5.81)
RDW: 12.9 % (ref 11.5–15.5)
WBC Count: 5.5 10*3/uL (ref 4.0–10.5)
nRBC: 0 % (ref 0.0–0.2)

## 2023-03-28 LAB — CMP (CANCER CENTER ONLY)
ALT: 10 U/L (ref 0–44)
AST: 14 U/L — ABNORMAL LOW (ref 15–41)
Albumin: 4.8 g/dL (ref 3.5–5.0)
Alkaline Phosphatase: 42 U/L (ref 38–126)
Anion gap: 6 (ref 5–15)
BUN: 14 mg/dL (ref 6–20)
CO2: 30 mmol/L (ref 22–32)
Calcium: 9.5 mg/dL (ref 8.9–10.3)
Chloride: 103 mmol/L (ref 98–111)
Creatinine: 1.07 mg/dL (ref 0.61–1.24)
GFR, Estimated: >60 mL/min (ref >60)
Glucose, Bld: 81 mg/dL (ref 70–99)
Potassium: 4.4 mmol/L (ref 3.5–5.1)
Sodium: 139 mmol/L (ref 135–145)
Total Bilirubin: 0.4 mg/dL (ref 0.3–1.2)
Total Protein: 6.8 g/dL (ref 6.5–8.1)

## 2023-03-28 LAB — LACTATE DEHYDROGENASE: LDH: 114 U/L (ref 98–192)

## 2023-04-03 ENCOUNTER — Other Ambulatory Visit: Payer: Self-pay

## 2023-04-03 DIAGNOSIS — C8338 Diffuse large B-cell lymphoma, lymph nodes of multiple sites: Secondary | ICD-10-CM

## 2023-04-03 DIAGNOSIS — D801 Nonfamilial hypogammaglobulinemia: Secondary | ICD-10-CM

## 2023-04-04 ENCOUNTER — Encounter: Payer: Self-pay | Admitting: Hematology

## 2023-04-04 NOTE — Progress Notes (Signed)
HEMATOLOGY/ONCOLOGY CLINIC NOTE  Date of Service: 03/28/2023    Patient Care Team: Jonnie Kind, MD as PCP - General (Family Medicine) Johney Maine, MD as Consulting Physician (Hematology) Transplant team at [facility] Dr. 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 He received 2 cycles of R-CHOP after which treatment was switched to dose adjusted R-EPOCH for the remaining 4 cycles due to the double expressor status. Interval PET CT scan after 3 cycles on 09/21/21 showed a complete response (Deauville 2). He completed 6 total cycles of treatment on 11/13/21. 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 He had a end of treatment PET CT scan on 12/14/21 which also showed a complete response. He was placed on surveillance.   RECURRENCE Patient developed recurrent, progressively worsening abdominal pain in 01/2022 and underwent a CT scan on 02/17/22. Results showed interval development of bulky adenopathy in the abdomen, highly concerning for recurrence. PET CT 02/22/22 showed extensive mesenteric adenopathy with SUV max 10.93. He underwent a core needle bx of the mesenteric mass on 02/28/22 and results showed recurrent DLBCL, similar to previous. He was referred to [place] for discussion of treatment options and consideration of CAR-T cell therapy. He received one cycle of R-GDC starting 03/02/22 for disease control prior to leukapheresis.   Second line therapy CAR-T CELL THERAPY - Leukapheresis 03/26/22.  - Bridging: R-ICE x 1 cycle 03/28/22  - Lymphodepletion: Fludarabine 30 mg/m2 IV daily for 3 doses, cyclophosphamide 500 mg/m2 IV daily for 3 doses. (9/6 - 04/20/22) - CAR -T cell infusion: Axi-Cel Mont Dutton) given 04/23/22 - CRS  Highest Grade: 3. Manifested as Fever and Hypotension. Last occurrence/resolution: 05/01/2022. - ICANS Highest Grade: None.  - WBC Recovery (First Day >1,500): 05/31/2022. - Platelet Recovery (First Day >75,000): 05/31/2022 with last platelet transfusion given NA.   HISTORY OF PRESENTING ILLNESS:  Please see previous notes for details on initial presentation   INTERVAL HISTORY: Allen Hodge is here for follow-up of his large B-cell lymphoma.  Patient was last seen by me in April 2024 for about 4 months ago.  He notes that he continues to feel better.  No fevers no chills no night sweats.  Has gained back most of his weight.  His head has also gone back at this time. He does note some intermittent palpitations and would like to see Dr. Yates Decamp in cardiology for further evaluation of this. No new infection issues. He notes that he has opted to skip the typical vaccines after CAR-T cell therapy. He is now off and on his IVIG as well since last levels of IgG for more than 400. Notes some stress at work due to significant job losses at [employer].  MEDICAL HISTORY:  Past Medical History:  Diagnosis Date   Allergy    Cancer (HCC)    skin cancer   Herniated disc    Hx of skin cancer, basal cell    Hyperlipidemia    borderline- no meds    Non Hodgkin's lymphoma (HCC)     SURGICAL HISTORY: Past Surgical History:  Procedure Laterality Date   COLONOSCOPY     last 2013   ESOPHAGOGASTRODUODENOSCOPY (EGD) WITH PROPOFOL N/A 06/29/2021   Procedure: ESOPHAGOGASTRODUODENOSCOPY (EGD) WITH PROPOFOL;  Surgeon: Rachael Fee, MD;  Location: [facility];  Service: Endoscopy;  Laterality: N/A;   EUS N/A 06/29/2021   Procedure: UPPER ENDOSCOPIC ULTRASOUND (EUS) RADIAL;  Surgeon: Rachael Fee, MD;  Location: [facility];  Service: Endoscopy;  Laterality: N/A;   FINE NEEDLE ASPIRATION N/A 06/29/2021   Procedure: FINE NEEDLE ASPIRATION (FNA) LINEAR;  Surgeon: Rachael Fee, MD;   Location: [facility];  Service: Endoscopy;  Laterality: N/A;   IR IMAGING GUIDED PORT INSERTION  07/26/2021   IR REMOVAL TUN ACCESS W/ PORT W/O FL MOD SED  12/06/2022   VASECTOMY      SOCIAL HISTORY: Social History   Socioeconomic History   Marital status: Married    Spouse name: Not on file   Number of children: 1   Years of education: Not on file   Highest education level: Not on file  Occupational History   Occupation: Sales   Tobacco Use   Smoking status: Never   Smokeless tobacco: Never  Substance and Sexual Activity   Alcohol use: Not Currently    Alcohol/week: 1.0 standard drink of alcohol    Types: 1 Glasses of wine per week    Comment: Rare    Drug use: No   Sexual activity: Not on file  Other Topics Concern   Not on file  Social History Narrative   Daily caffeine    Social Determinants of Health   Financial Resource Strain: Not on file  Food Insecurity: Not on file  Transportation Needs: Not on file  Physical Activity: Not on file  Stress: Not on file  Social Connections: Not on file  Intimate Partner Violence: Not on file    FAMILY HISTORY: Family History  Problem Relation Age of Onset   Colon cancer Mother 73   Colon polyps Mother    Colon cancer Paternal Grandfather    Alzheimer's disease Paternal Grandfather    Breast cancer Maternal Aunt    Diabetes Maternal Grandmother    Prostate cancer Maternal Grandfather    Esophageal cancer Neg Hx    Rectal cancer Neg Hx    Stomach cancer Neg Hx     ALLERGIES:  is allergic to bee pollen-1000-royal jelly [nutritional supplements], gluten meal, and lactose intolerance (gi).  MEDICATIONS:  Current Outpatient Medications  Medication Sig Dispense Refill   Ascorbic Acid (VITAMIN C PO) Take 1 tablet by mouth 3 (three) times a week.     b complex vitamins capsule Take 1 capsule by mouth 3 (three) times a week.     Cholecalciferol (VITAMIN D-3 PO) Take 1 tablet by mouth 3 (three) times a week.      Cyanocobalamin (B-12) 5000 MCG SUBL Take 1 tablet by mouth 2 (two) times daily.     acetaminophen (TYLENOL) 325 MG tablet Take 2 tablets (650 mg total) by mouth every 4 (four) hours as needed for mild pain. (Patient not taking: Reported on 03/28/2023)     diphenhydrAMINE (BENADRYL) 25 MG tablet Take 25 mg by mouth at bedtime as needed (seasonal allergies). (Patient not taking: Reported on 03/28/2023)     fluconazole (DIFLUCAN) 200 MG tablet Take 400 mg by mouth once. (Patient not taking: Reported on 03/28/2023)     ibuprofen (ADVIL) 200 MG tablet Take 400 mg by mouth every 6 (six) hours as needed (pain). (Patient not taking: Reported on 03/28/2023)     levofloxacin (LEVAQUIN) 500 MG tablet Take 1 tablet by mouth daily. (Patient not taking: Reported on 03/28/2023)     lidocaine-prilocaine (EMLA) cream Apply to affected area once (Patient not  taking: Reported on 03/28/2023) 30 g 3   loratadine (CLARITIN) 10 MG tablet Take 10 mg by mouth daily as needed (seasonal allergies).     LORazepam (ATIVAN) 0.5 MG tablet Take 1 tablet (0.5 mg total) by mouth every 6 (six) hours as needed (Nausea or vomiting). (Patient not taking: Reported on 03/28/2023) 30 tablet 0   methylcellulose (CITRUCEL) oral powder Take 1 packet by mouth daily. (Patient not taking: Reported on 03/28/2023)     Naltrexone HCl, Pain, 4.5 MG CAPS Take 1 tablet by mouth at bedtime. (Patient not taking: Reported on 03/28/2023)     ondansetron (ZOFRAN) 8 MG tablet Take 1 tablet (8 mg total) by mouth 2 (two) times daily as needed for refractory nausea / vomiting. Start on day 3 after cyclophosphamide chemotherapy. (Patient not taking: Reported on 03/28/2023) 30 tablet 1   pantoprazole (PROTONIX) 40 MG tablet Take 1 tablet (40 mg total) by mouth daily before breakfast. (Patient not taking: Reported on 03/28/2023) 30 tablet 2   polyethylene glycol (MIRALAX / GLYCOLAX) 17 g packet Take 17 g by mouth daily as needed (constipation). (Patient not taking: Reported on  03/28/2023)     predniSONE (DELTASONE) 20 MG tablet Take 1 tablet (20 mg total) by mouth as needed. (Patient not taking: Reported on 03/28/2023) 60 tablet 0   senna-docusate (SENOKOT-S) 8.6-50 MG tablet Take 1 tablet by mouth at bedtime as needed for mild constipation. (Patient not taking: Reported on 03/28/2023)     sulfamethoxazole-trimethoprim (BACTRIM DS) 800-160 MG tablet Take 1 tablet by mouth 3 (three) times a week. (Patient not taking: Reported on 03/28/2023)     Tetrahydrozoline HCl (VISINE OP) Place 1 drop into both eyes daily as needed (dry eyes). (Patient not taking: Reported on 03/28/2023)     valACYclovir (VALTREX) 500 MG tablet Take 500 mg by mouth 2 (two) times daily. (Patient not taking: Reported on 03/28/2023)     No current facility-administered medications for this visit.    REVIEW OF SYSTEMS:   10 Point review of Systems was done is negative except as noted above.   PHYSICAL EXAMINATION: ECOG PERFORMANCE STATUS: 1 - Symptomatic but completely ambulatory  Vitals:   03/28/23 1439  BP: 101/79  Pulse: 82  Resp: 20  Temp: 98.2 F (36.8 C)  SpO2: 100%   Filed Weights   03/28/23 1439  Weight: 186 lb 9.6 oz (84.6 kg)  Body mass index is 24.62 kg/m.  GENERAL:alert, in no acute distress and comfortable SKIN: no acute rashes, no significant lesions EYES: conjunctiva are pink and non-injected, sclera anicteric OROPHARYNX: MMM, no exudates, no oropharyngeal erythema or ulceration NECK: supple, no JVD LYMPH:  no palpable lymphadenopathy in the cervical, axillary or inguinal regions LUNGS: clear to auscultation b/l with normal respiratory effort HEART: regular rate & rhythm ABDOMEN:  normoactive bowel sounds , non tender, not distended.  No palpable hepatosplenomegaly Extremity: no pedal edema PSYCH: alert & oriented x 3 with fluent speech NEURO: no focal motor/sensory deficits  LABORATORY DATA:  I have reviewed the data as listed       Latest Ref Rng & Units  03/28/2023    1:49 PM 12/04/2022    8:56 AM 10/02/2022    7:47 AM  CBC  WBC 4.0 - 10.5 K/uL 5.5  4.6  3.5   Hemoglobin 13.0 - 17.0 g/dL 09.8  11.9  14.7   Hematocrit 39.0 - 52.0 % 41.4  41.0  36.8   Platelets 150 - 400 K/uL 204  198  191       Latest Ref Rng & Units 03/28/2023    1:49 PM 12/04/2022    8:56 AM 10/02/2022    7:47 AM  CMP  Glucose 70 - 99 mg/dL 81  409  811   BUN 6 - 20 mg/dL 14  14  16    Creatinine 0.61 - 1.24 mg/dL 9.14  7.82  9.56   Sodium 135 - 145 mmol/L 139  138  138   Potassium 3.5 - 5.1 mmol/L 4.4  4.1  3.9   Chloride 98 - 111 mmol/L 103  104  106   CO2 22 - 32 mmol/L 30  28  26    Calcium 8.9 - 10.3 mg/dL 9.5  21.3  9.3   Total Protein 6.5 - 8.1 g/dL 6.8  6.8  6.6   Total Bilirubin 0.3 - 1.2 mg/dL 0.4  0.3  0.4   Alkaline Phos 38 - 126 U/L 42  49  43   AST 15 - 41 U/L 14  15  14    ALT 0 - 44 U/L 10  16  11     Lab Results  Component Value Date   LDH 114 03/28/2023   PATHOLOGY 02/28/2022 A. Lymph node  Clinical History: Concern for recurrent lymphoma, post CT guided BX of  bulky abdominal nodal conglomeration  DIAGNOSIS:  A. LYMPH NODE, ABDOMINAL; CT-GUIDED CORE NEEDLE BIOPSY:  - DIFFUSE LARGE B-CELL LYMPHOMA, SIMILAR TO PREVIOUS.  Comment:  This case was reviewed together with the prior diagnostic biopsy  ([id number]).  Concurrent flow cytometric studies identify a  CD5-negative, CD10-negative clonal B cell population, large in size. For  further details, see scanned report in [facility]. Additional  immunohistochemical testing is deferred in favor of saving limited  tissue for potential ancillary testing.    SURGICAL PATHOLOGY 07/19/2021 A. Lymph node, right neck  Clinical History: History of malignant non Hodgkin's lymphoma after EUS  biopsy on 11/17, however sample was insufficient to subclassify for  treatment planning.  Now post US guided bx of hypermetabolic right  supraclavicular lymph node  DIAGNOSIS:  A. LYMPH NODE, RIGHT NECK;  ULTRASOUND-GUIDED BIOPSY:  - LARGE B-CELL LYMPHOMA.  - SEE COMMENT.  Comment:  Sections demonstrate sheets of large, markedly atypical cells with  prominent macronucleoli.  Mitotic activity is brisk.  Flow cytometry  demonstrates a CD5 negative, CD10 negative monoclonal B cell population.  Definitive classification is dependent on prognostically significant IHC  and FISH studies, which will be reported in an addendum.      RADIOGRAPHIC STUDIES: I have personally reviewed the radiological images as listed and agreed with the findings in the report.  No results found.  PET scan 05/23/2022: CONCLUSION: 1.  Deauville 2 as demonstrated by a significant interval decrease in size and FDG avidity of the previously described lymphadenopathy with a few residual mesenteric lymph nodes with FDG uptake similar to blood pool. This constitutes complete response by the [facility] classification. 2.  Ancillary CT findings as above.   PET CT scan 05/23/2022 post CAR-T cell therapy day +30 PET LYMPHOMA NON HODGKINS (W/LOW DOSE CT) Anatomical Region Laterality Modality  Abdomen -- Positron Emission Tomography (PET)  Impression 1.  Deauville 2 as demonstrated by a significant interval decrease in size and FDG avidity of the previously described lymphadenopathy with a few residual mesenteric lymph nodes with FDG uptake similar to blood pool. This constitutes complete response by the [facility] classification. 2.  Ancillary CT findings as above.   ASSESSMENT & PLAN:  Very pleasant 42 y.o. gentleman with some chronic food related bowel irregularities with  1) Stage IVA large B-cell lymphoma NOS.  Double expressor ABC type non-germinal center (activated B cell) immunophenotype. Co-expression of BCL2 and C-MYC by IHC may be associated with a more aggressive clinical course.   Immunohistochemical studies demonstrate the tumor cells to be positive  for CD20, BCL2, BCL6, MUM1, and C-MYC, and negative for CD5,  CD10, CD30,  and EBER ISH.   non-germinal center (activated B cell) immunophenotype.  Co-expression of BCL2 and C-MYC by IHC may be associated with a more aggressive clinical course.   Lymphoma FISH panel shows no evidence of c-Myc rearrangement ruling out double hit or triple hit lymphoma. Patient presented with extensive abdominal and retroperitoneal lymphadenopathy, right supraclavicular lymphadenopathy and lung nodules concerning for lymphoma involvement as well as concern for small bowel involvement.  PET scan from 08/01/2022 showed minimal residual soft tissue thickening/nodularity and FDG avidity within the right eccentric small bowel mesenteric root. No findings to suggest new lymphomatous disease. Findings are consistent with Deauville 3/Complete Response (CR) per the [facility] classification. Right upper lobe peribronchial thickening and tree-in-bud nodularity with associated FDG-avidity. This is favored to reflect infectious/inflammatory etiologies.    2) pulmonary nodules concerning for lymphoma involvement  3) 4.4 cm small bowel thickening concerning for involvement by lymphoma.  Pathology from 02/28/22: A. LYMPH NODE, ABDOMINAL; CT-GUIDED CORE NEEDLE BIOPSY:  - DIFFUSE LARGE B-CELL LYMPHOMA, SIMILAR TO PREVIOUS.   4) acute kidney injury.  Creatinine improved to 0.88 today, 12/04/22.  5) Food intolerances including gluten and dairy avoidance.  6) DLBCL s/p CAR-T Therapy  Oncology history:  First-line therapy with R-CHOP starting on 07/27/21. He received 2 cycles of R-CHOP after which treatment was switched to dose adjusted R-EPOCH for the remaining 4 cycles due to the double expressor status. Interval PET CT scan after 3 cycles on 09/21/21 showed a complete response (Deauville 2). He completed 6 total cycles of treatment on 11/13/21. 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 He had a end of treatment PET CT scan on 12/14/21 which also showed a complete  response. He was placed on surveillance.   RECURRENCE Patient developed recurrent, progressively worsening abdominal pain in 01/2022 and underwent a CT scan on 02/17/22. Results showed interval development of bulky adenopathy in the abdomen, highly concerning for recurrence. PET CT 02/22/22 showed extensive mesenteric adenopathy with SUV max 10.93. He underwent a core needle bx of the mesenteric mass on 02/28/22 and results showed recurrent DLBCL, similar to previous. He was referred to [place] for discussion of treatment options and consideration of CAR-T cell therapy. He received one cycle of R-GDC starting 03/02/22 for disease control prior to leukapheresis.   Second line Lymphodepleting Regimen: Fludarabine 30 mg/m2 IV daily for 3 doses and cyclophosphamide 500 mg/m2 IV daily for 3 doses.(Completed on 04/20/2022) CAR-T Product: Axicabtagene Ciloleucel / Mont Dutton.04/23/2022 CRS Highest Grade: 3. Manifested as Fever and Hypotension. Last occurrence/resolution: 05/01/2022. ICANS Highest Grade: None.  WBC Recover (First Day >1,500): 05/31/2022. Platelet Recover (First Day >75,000): 05/31/2022 with last platelet transfusion given NA.   #7 history of BK Viruria Symptoms resolved. Continue adequate hydration.   #8 Prolonged Pancytopenia- now resolved. Antimicrobial prophylaxis- per transplant team.  #9 Hypogammaglobulinemia with IgG1 subclass deficiency  suggestion  Information displayed in this report may not trend or trigger automated decision support.    Ref Range & Units 8 d ago  IGG 635 - 1741 MG/DL 696 Low  IGM 45 - 281 MG/DL <84 Low   IGA 66 - 132 MG/DL 20 Low    -Patient on IVIG monthly to reduce risk of infections in the context of hypogammaglobinemia and IgG 1 subclass deficiency as per his transplant team recommendations.   #10 Prophylaxis ID Acyclovir 800 mg BID for HSV/VZV. Levofloxacin 500 mg daily for bacterial. Fluconazole 400 mg daily for fungal. Bactrim DS 1 tab Monday, Wednesday,  Friday for PCP.   #11 GERD Continue omeprazole 20 mg daily.    #12 Supplement Use Vitamin B complex, Vitamin D3/K2, Vitamin C all ok to resume now post CAR-T.   #13 Anxiety/Anticipatory Nausea Continue lorazepam 0.5 mg every 8 hours as needed.    PLAN -Discussed lab results from today CBC within normal limits, CMP within normal limits and LDH within normal limits Patient has no lab or clinical evidence of lymphoma progression at this time -Recommended to continue to his Vitamin B, Vitamin C, B-Complex and Vitamin D supplements.  -He has decided to hold off on post CAR-T vaccination protocol. -Will refer the patient to Dr. Yates Decamp for evaluation of his recurrent palpitations  No orders of the defined types were placed in this encounter.    Follow-up:  RTC with Dr Candise Che with labs in 2nd week of Jan 2025 Refer to Dr Weston [facility] for evaluation of recurrent palpitations    The total time spent in the appointment was 21 minutes*.  All of the patient's questions were answered with apparent satisfaction. The patient knows to call the clinic with any problems, questions or concerns.   Wyvonnia Lora MD MS AAHIVMS [facility] Hematology/Oncology Physician [facility]  .*Total Encounter Time as defined by the Centers for Medicare and Medicaid Services includes, in addition to the face-to-face time of a patient visit (documented in the note above) non-face-to-face time: obtaining and reviewing outside history, ordering and reviewing medications, tests or procedures, care coordination (communications with other health care professionals or caregivers) and documentation in the medical record.

## 2023-04-09 ENCOUNTER — Other Ambulatory Visit: Payer: Self-pay

## 2023-04-09 DIAGNOSIS — C8338 Diffuse large B-cell lymphoma, lymph nodes of multiple sites: Secondary | ICD-10-CM

## 2023-04-22 ENCOUNTER — Encounter: Payer: Self-pay | Admitting: Hematology

## 2023-05-06 ENCOUNTER — Telehealth: Payer: Self-pay | Admitting: Hematology

## 2023-05-21 NOTE — Progress Notes (Unsigned)
Cardiology Office Note:  .   Date:  05/22/2023  ID:  Allen Hodge, DOB [birth year], MRN [id number] PCP: Jonnie Kind, MD   [facility] Providers Cardiologist:  Yates Decamp, MD    History of Present Illness: Allen Hodge   Allen Hodge is a 42 y.o. Male patient with diffuse large B cell lymphoma, hypogammaglobulinemia presently on chemotherapy started on 07/27/2021 with complete response as of 12/14/2021, but on recurrence and presently on CAR-T cell therapy referred to me for evaluation and management of palpitations.  There is no history of hypertension, hypercholesterolemia, diabetes mellitus or family history of premature coronary disease.  Discussed the use of AI scribe software for clinical note transcription with the patient, who gave verbal consent to proceed.  History of Present Illness   The patient, with a history of non-Hodgkin lymphoma treated with chemotherapy and CAR T treatment, presents with concerns about potential cardiac effects of chemotherapy. The patient reports no symptoms of chest pain or shortness of breath during exercise or at rest. The patient also has concerns about elevated cholesterol levels, with a recent lab panel showing total cholesterol of 238, triglycerides of 288, HDL of 45, and LDL of 141. The patient maintains a healthy diet, avoiding dairy and gluten, and exercises regularly, including walking and gym workouts.  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 The patient experiences intermittent heart palpitations, more noticeable at rest, which are described as "flutters" and last for a few seconds. These palpitations are not associated with exercise and occur sporadically, with periods of absence lasting for weeks.      Review of Systems  Cardiovascular:  Positive for palpitations. Negative for chest pain, dyspnea on exertion and leg swelling.   Risk  Assessment/Calculations:     Patient's cardiovascular risk history includes: LDL goal is under 100  ACC/AHA CV risk: 10-year risk of ASCVD 1.5%.  No results found for: "CHOL", "HDL", "LDLCALC", "LDLDIRECT", "TRIG", "CHOLHDL" Lab Results  Component Value Date   NA 139 03/28/2023   K 4.4 03/28/2023   CO2 30 03/28/2023   GLUCOSE 81 03/28/2023   BUN 14 03/28/2023   CREATININE 1.07 03/28/2023   CALCIUM 9.5 03/28/2023   GFR 52.78 (L) 06/26/2021   GFRNONAA >60 03/28/2023   Lab Results  Component Value Date   WBC 5.5 03/28/2023   HGB 14.2 03/28/2023   HCT 41.4 03/28/2023   MCV 93.2 03/28/2023   PLT 204 03/28/2023    External Labs:  Labs 04/22/2023:  Serum glucose 95 mg.  Total cholesterol 238, triglycerides: 288, HDL 45, LDL 141.  Physical Exam:   VS:  BP 102/70 (BP Location: Left Arm, Patient Position: Sitting, Cuff Size: Normal)   Pulse 69   Resp 16   Ht 6\' 1"  (1.854 m)   BMI 24.62 kg/m    Wt Readings from Last 3 Encounters:  03/28/23 186 lb 9.6 oz (84.6 kg)  12/04/22 189 lb 11.2 oz (86 kg)  10/02/22 181 lb 12 oz (82.4 kg)     Physical Exam Neck:     Vascular: No carotid bruit or JVD.  Cardiovascular:     Rate and Rhythm: Normal rate and regular rhythm.     Pulses: Intact distal pulses.     Heart sounds: Normal heart sounds. No murmur heard.    No gallop.  Pulmonary:     Effort: Pulmonary effort is normal.     Breath sounds: Normal  breath sounds.  Abdominal:     General: Bowel sounds are normal.     Palpations: Abdomen is soft.  Musculoskeletal:     Right lower leg: No edema.     Left lower leg: No edema.     Studies Reviewed: Allen Hodge    EKG:    EKG Interpretation Date/Time:  Wednesday May 22 2023 09:03:51 EDT Ventricular Rate:  61 PR Interval:  144 QRS Duration:  100 QT Interval:  404 QTC Calculation: 406 R Axis:   -36  Text Interpretation: EKG 05/22/2023: Normal sinus rhythm at the rate of 61 bpm, left anterior fascicular block.  Compared to  02/20/2016, left axis deviation is new. Otherwise no significant change. Confirmed by Delrae Rend [id number]) on 05/22/2023 9:33:08 AM    Echocardiogram 07/25/2021:  1. Left ventricular ejection fraction, by estimation, is 60 to 65%. The left ventricle has normal function. The left ventricle has no regional wall motion abnormalities. Left ventricular diastolic parameters were normal. The average left ventricular global longitudinal strain is -21.4 %. The global longitudinal strain is normal. 2. Right ventricular systolic function is normal. The right ventricular size is normal. 3. ? Port/catheter seen in RA. 4. The mitral valve is normal in structure. No evidence of mitral valve regurgitation. No evidence of mitral stenosis. 5. The aortic valve is tricuspid. Aortic valve regurgitation is not visualized. No aortic stenosis is present. 6. The inferior vena cava is normal in size with greater than 50% respiratory variability, suggesting right atrial pressure of 3 mmHg.  CT scan of the chest and abdomen 04/24/2023:  Para-aortic and central mesenteric adenopathy Thoracic and abdominal aorta are normal, no mention of aortic atherosclerosis or aneurysm.  ASSESSMENT AND PLAN: .      ICD-10-CM   1. Palpitations  R00.2 EKG 12-Lead    ECHOCARDIOGRAM COMPLETE    2. Mixed hyperlipidemia  E78.2 rosuvastatin (CRESTOR) 10 MG tablet    Lipid Profile    3. Nonspecific abnormal electrocardiogram (ECG) (EKG)  R94.31 ECHOCARDIOGRAM COMPLETE      Assessment and Plan    Non-Hodgkin Lymphoma In remission following CAR T treatment with recent scans showing no evidence of disease. Concerns about potential cardiac effects of chemotherapy. -Order baseline echocardiogram to assess cardiac function.  Hyperlipidemia Elevated total cholesterol (238), LDL (141), and triglycerides (288) despite healthy diet and regular exercise. -Start rosuvastatin 10mg  daily. -Recommend dietary modifications including increased  vegetable intake, nightly Metamucil, and flaxseed flakes. -Repeat lipid profile in 2 months.  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 -Explain that this is a normal variant and does not require intervention.  Palpitations Intermittent, more noticeable at rest, described as "flutters". No associated symptoms during exercise. -Explain that these are likely benign premature ventricular complexes (PVCs), especially if echocardiogram is normal. -Plan to reassess if echocardiogram shows reduced cardiac function or other abnormalities.  General Health Maintenance -Continue regular exercise regimen. -Consider coronary calcium score in the future to assess for plaque buildup, though current risk of cardiovascular event is low (1.5% over 10 years).   If echocardiogram is normal, lipids are well-controlled in 2 months, he will follow-up with his PCP for further management and evaluation and I will see him back on a as needed basis.  Signed,  Yates Decamp, MD, [facility] 05/22/2023, 5:37 PM

## 2023-05-22 ENCOUNTER — Encounter: Payer: Self-pay | Admitting: Cardiology

## 2023-05-22 ENCOUNTER — Ambulatory Visit: Payer: 59 | Attending: Cardiology | Admitting: Cardiology

## 2023-05-22 VITALS — BP 102/70 | HR 69 | Resp 16 | Ht 73.0 in

## 2023-05-22 DIAGNOSIS — E782 Mixed hyperlipidemia: Secondary | ICD-10-CM | POA: Diagnosis not present

## 2023-05-22 DIAGNOSIS — R002 Palpitations: Secondary | ICD-10-CM | POA: Diagnosis not present

## 2023-05-22 DIAGNOSIS — R9431 Abnormal electrocardiogram [ECG] [EKG]: Secondary | ICD-10-CM | POA: Diagnosis not present

## 2023-05-22 MED ORDER — ROSUVASTATIN CALCIUM 10 MG PO TABS
10.0000 mg | ORAL_TABLET | Freq: Every day | ORAL | 2 refills | Status: DC
Start: 2023-05-22 — End: 2024-03-16

## 2023-05-22 NOTE — Patient Instructions (Signed)
Medication Instructions:  Your physician has recommended you make the following change in your medication: Start Rosuvastatin 10 mg by mouth daily   *If you need a refill on your cardiac medications before your next appointment, please call your pharmacy*   Lab Work: Your physician recommends that you return for lab work in: 2 months.  Lipid profile.  This will be fasting  If you have labs (blood work) drawn today and your tests are completely normal, you will receive your results only by: MyChart Message (if you have MyChart) OR A paper copy in the mail If you have any lab test that is abnormal or we need to change your treatment, we will call you to review the results.   Testing/Procedures: Your physician has requested that you have an echocardiogram. Echocardiography is a painless test that uses sound waves to create images of your heart. It provides your doctor with information about the size and shape of your heart and how well your heart's chambers and valves are working. This procedure takes approximately one hour. There are no restrictions for this procedure. Please do NOT wear cologne, perfume, aftershave, or lotions (deodorant is allowed). Please arrive 15 minutes prior to your appointment time.    Follow-Up: At [facility], you and your health needs are our priority.  As part of our continuing mission to provide you with exceptional heart care, we have created designated Provider Care Teams.  These Care Teams include your primary Cardiologist (physician) and Advanced Practice Providers (APPs -  Physician Assistants and Nurse Practitioners) who all work together to provide you with the care you need, when you need it.  We recommend signing up for the patient portal called "MyChart".  Sign up information is provided on this After Visit Summary.  MyChart is used to connect with patients for Virtual Visits (Telemedicine).  Patients are able to view lab/test results,  encounter notes, upcoming appointments, etc.  Non-urgent messages can be sent to your provider as well.   To learn more about what you can do with MyChart, go to [email or web address].    Your next appointment:   As needed  Provider:   Yates Decamp, MD     Other Instructions

## 2023-06-10 ENCOUNTER — Ambulatory Visit (HOSPITAL_COMMUNITY): Payer: 59 | Attending: Cardiology

## 2023-06-10 DIAGNOSIS — R9431 Abnormal electrocardiogram [ECG] [EKG]: Secondary | ICD-10-CM | POA: Insufficient documentation

## 2023-06-10 DIAGNOSIS — R002 Palpitations: Secondary | ICD-10-CM | POA: Insufficient documentation

## 2023-06-10 LAB — ECHOCARDIOGRAM COMPLETE
Area-P 1/2: 3.03 cm2
S' Lateral: 3.4 cm

## 2023-06-16 NOTE — Progress Notes (Signed)
Echocardiogram 06/10/2023:   1. Left ventricular ejection fraction, by estimation, is 60 to 65%. The left ventricle has normal function. The left ventricle has no regional wall motion abnormalities. Left ventricular diastolic parameters were normal. GLS - 20.6%.  2. Right ventricular systolic function is normal. The right ventricular size is normal. There is normal pulmonary artery systolic pressure.  3. The mitral valve is normal in structure. No evidence of mitral valve regurgitation. No evidence of mitral stenosis.  4. The aortic valve is normal in structure. Aortic valve regurgitation is not visualized. No aortic stenosis is present.  5. The inferior vena cava is normal in size with greater than 50% respiratory variability, suggesting right atrial pressure of 3 mmHg.

## 2023-07-19 ENCOUNTER — Encounter: Payer: Self-pay | Admitting: *Deleted

## 2023-07-22 ENCOUNTER — Ambulatory Visit: Payer: 59

## 2023-07-22 DIAGNOSIS — E782 Mixed hyperlipidemia: Secondary | ICD-10-CM

## 2023-08-16 ENCOUNTER — Other Ambulatory Visit: Payer: Self-pay

## 2023-08-16 DIAGNOSIS — C8338 Diffuse large B-cell lymphoma, lymph nodes of multiple sites: Secondary | ICD-10-CM

## 2023-08-19 ENCOUNTER — Inpatient Hospital Stay: Payer: 59 | Attending: Hematology

## 2023-08-19 ENCOUNTER — Inpatient Hospital Stay: Payer: 59 | Admitting: Hematology

## 2023-08-19 VITALS — BP 116/77 | HR 72 | Temp 97.7°F | Resp 16 | Wt 184.8 lb

## 2023-08-19 DIAGNOSIS — R918 Other nonspecific abnormal finding of lung field: Secondary | ICD-10-CM | POA: Diagnosis not present

## 2023-08-19 DIAGNOSIS — Z800 Family history of malignant neoplasm of digestive organs: Secondary | ICD-10-CM | POA: Diagnosis not present

## 2023-08-19 DIAGNOSIS — C8338 Diffuse large B-cell lymphoma, lymph nodes of multiple sites: Secondary | ICD-10-CM

## 2023-08-19 DIAGNOSIS — F419 Anxiety disorder, unspecified: Secondary | ICD-10-CM | POA: Insufficient documentation

## 2023-08-19 DIAGNOSIS — C83398 Diffuse large b-cell lymphoma of other extranodal and solid organ sites: Secondary | ICD-10-CM | POA: Insufficient documentation

## 2023-08-19 DIAGNOSIS — Z79899 Other long term (current) drug therapy: Secondary | ICD-10-CM | POA: Insufficient documentation

## 2023-08-19 DIAGNOSIS — Z803 Family history of malignant neoplasm of breast: Secondary | ICD-10-CM | POA: Diagnosis not present

## 2023-08-19 DIAGNOSIS — K219 Gastro-esophageal reflux disease without esophagitis: Secondary | ICD-10-CM | POA: Diagnosis not present

## 2023-08-19 DIAGNOSIS — Z8042 Family history of malignant neoplasm of prostate: Secondary | ICD-10-CM | POA: Insufficient documentation

## 2023-08-19 LAB — CMP (CANCER CENTER ONLY)
ALT: 12 U/L (ref 0–44)
AST: 15 U/L (ref 15–41)
Albumin: 4.7 g/dL (ref 3.5–5.0)
Alkaline Phosphatase: 36 U/L — ABNORMAL LOW (ref 38–126)
Anion gap: 4 — ABNORMAL LOW (ref 5–15)
BUN: 13 mg/dL (ref 6–20)
CO2: 29 mmol/L (ref 22–32)
Calcium: 9.9 mg/dL (ref 8.9–10.3)
Chloride: 104 mmol/L (ref 98–111)
Creatinine: 0.99 mg/dL (ref 0.61–1.24)
GFR, Estimated: >60 mL/min (ref >60)
Glucose, Bld: 88 mg/dL (ref 70–99)
Potassium: 4.2 mmol/L (ref 3.5–5.1)
Sodium: 137 mmol/L (ref 135–145)
Total Bilirubin: 0.5 mg/dL (ref 0.0–1.2)
Total Protein: 6.6 g/dL (ref 6.5–8.1)

## 2023-08-19 LAB — CBC WITH DIFFERENTIAL (CANCER CENTER ONLY)
Abs Immature Granulocytes: 0.01 10*3/uL (ref 0.00–0.07)
Basophils Absolute: 0 10*3/uL (ref 0.0–0.1)
Basophils Relative: 1 %
Eosinophils Absolute: 0.1 10*3/uL (ref 0.0–0.5)
Eosinophils Relative: 2 %
HCT: 41.3 % (ref 39.0–52.0)
Hemoglobin: 14.5 g/dL (ref 13.0–17.0)
Immature Granulocytes: 0 %
Lymphocytes Relative: 36 %
Lymphs Abs: 1.9 10*3/uL (ref 0.7–4.0)
MCH: 32.5 pg (ref 26.0–34.0)
MCHC: 35.1 g/dL (ref 30.0–36.0)
MCV: 92.6 fL (ref 80.0–100.0)
Monocytes Absolute: 0.4 10*3/uL (ref 0.1–1.0)
Monocytes Relative: 7 %
Neutro Abs: 2.9 10*3/uL (ref 1.7–7.7)
Neutrophils Relative %: 54 %
Platelet Count: 196 10*3/uL (ref 150–400)
RBC: 4.46 MIL/uL (ref 4.22–5.81)
RDW: 12.6 % (ref 11.5–15.5)
WBC Count: 5.3 10*3/uL (ref 4.0–10.5)
nRBC: 0 % (ref 0.0–0.2)

## 2023-08-19 LAB — LACTATE DEHYDROGENASE: LDH: 118 U/L (ref 98–192)

## 2023-08-20 ENCOUNTER — Telehealth: Payer: Self-pay | Admitting: Hematology

## 2023-08-20 NOTE — Telephone Encounter (Signed)
 Left patient a vm regarding upcoming appointment

## 2023-08-26 ENCOUNTER — Encounter: Payer: Self-pay | Admitting: Hematology

## 2023-08-26 NOTE — Progress Notes (Signed)
 HEMATOLOGY/ONCOLOGY CLINIC NOTE  Date of Service: 08/19/2023    Patient Care Team: Zackary Megan ORN, MD as PCP - General (Family Medicine) Ladona Heinz, MD as PCP - Cardiology (Cardiology) Onesimo Emaline Brink, MD as Consulting Physician (Hematology) Transplant team at [facility] Dr. 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 He received 2 cycles of R-CHOP after which treatment was switched to dose adjusted R-EPOCH for the remaining 4 cycles due to the double expressor status. Interval PET CT scan after 3 cycles on 09/21/21 showed a complete response (Deauville 2). He completed 6 total cycles of treatment on 11/13/21. 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 He had a end of treatment PET CT scan on 12/14/21 which also showed a complete response. He was placed on surveillance.   RECURRENCE Patient developed recurrent, progressively worsening abdominal pain in 01/2022 and underwent a CT scan on 02/17/22. Results showed interval development of bulky adenopathy in the abdomen, highly concerning for recurrence. PET CT 02/22/22 showed extensive mesenteric adenopathy with SUV max 10.93. He underwent a core needle bx of the mesenteric mass on 02/28/22 and results showed recurrent DLBCL, similar to previous. He was referred to us  for discussion of treatment options and consideration of CAR-T cell therapy. He received one cycle of R-GDC starting 03/02/22 for disease control prior to leukapheresis.   Second line therapy CAR-T CELL THERAPY - Leukapheresis 03/26/22.  - Bridging: R-ICE x 1 cycle 03/28/22  - Lymphodepletion: Fludarabine 30 mg/m2 IV daily for 3 doses, cyclophosphamide  500 mg/m2 IV daily for 3 doses. (9/6 - 04/20/22) - CAR -T cell infusion: Axi-Cel (Yescarta) given 04/23/22 - CRS  Highest Grade: 3. Manifested as Fever and Hypotension. Last occurrence/resolution: 05/01/2022. - ICANS Highest Grade: None.  - WBC Recovery (First Day >1,500): 05/31/2022. - Platelet Recovery (First Day >75,000): 05/31/2022 with last platelet transfusion given NA.   HISTORY OF PRESENTING ILLNESS:  Please see previous notes for details on initial presentation   INTERVAL HISTORY:  Mr Allen Hodge is here for follow-up of his large B-cell lymphoma. He notes that he is doing well and noted no acute new symptoms. So intermittent abdominal discomfort but this is not persistent. No new lumps/bumps. No fevers/chills/night sweats or unexpected weight loss. No infection issues. Labs done today reviewed in details.  MEDICAL HISTORY:  Past Medical History:  Diagnosis Date   Allergy    Cancer (HCC)    skin cancer   Herniated disc    Hx of skin cancer, basal cell    Hyperlipidemia    borderline- no meds    Non Hodgkin's lymphoma (HCC)     SURGICAL HISTORY: Past Surgical History:  Procedure Laterality Date   COLONOSCOPY     last 2013   ESOPHAGOGASTRODUODENOSCOPY (EGD) WITH PROPOFOL  N/A 06/29/2021   Procedure: ESOPHAGOGASTRODUODENOSCOPY (EGD) WITH PROPOFOL ;  Surgeon: Teressa Toribio SQUIBB, MD;  Location: [facility];  Service: Endoscopy;  Laterality: N/A;   EUS N/A 06/29/2021   Procedure: UPPER ENDOSCOPIC ULTRASOUND (EUS) RADIAL;  Surgeon: Teressa Toribio SQUIBB, MD;  Location: [facility];  Service: Endoscopy;  Laterality: N/A;   FINE NEEDLE ASPIRATION N/A 06/29/2021   Procedure: FINE NEEDLE ASPIRATION (FNA) LINEAR;  Surgeon: Teressa Toribio SQUIBB, MD;  Location: [facility];  Service: Endoscopy;  Laterality: N/A;   IR IMAGING GUIDED PORT INSERTION  07/26/2021  IR REMOVAL TUN ACCESS W/ PORT W/O FL MOD SED  12/06/2022   VASECTOMY      SOCIAL HISTORY: Social History   Socioeconomic History   Marital status: Married    Spouse name: Not on file   Number of children: 1   Years of  education: Not on file   Highest education level: Not on file  Occupational History   Occupation: Sales   Tobacco Use   Smoking status: Never   Smokeless tobacco: Never  Vaping Use   Vaping status: Never Used  Substance and Sexual Activity   Alcohol  use: Yes    Alcohol /week: 1.0 standard drink of alcohol     Types: 1 Glasses of wine per week    Comment: Rare    Drug use: No   Sexual activity: Not on file  Other Topics Concern   Not on file  Social History Narrative   Daily caffeine    Social Drivers of Corporate Investment Banker Strain: Not on file  Food Insecurity: Not on file  Transportation Needs: Not on file  Physical Activity: Not on file  Stress: Not on file  Social Connections: Not on file  Intimate Partner Violence: Not on file    FAMILY HISTORY: Family History  Problem Relation Age of Onset   Colon cancer Mother 23   Colon polyps Mother    Colon cancer Paternal Grandfather    Alzheimer's disease Paternal Grandfather    Breast cancer Maternal Aunt    Diabetes Maternal Grandmother    Prostate cancer Maternal Grandfather    Esophageal cancer Neg Hx    Rectal cancer Neg Hx    Stomach cancer Neg Hx     ALLERGIES:  is allergic to bee pollen-1000-royal jelly [nutritional supplements], gluten meal, and lactose intolerance (gi).  MEDICATIONS:  Current Outpatient Medications  Medication Sig Dispense Refill   acetaminophen  (TYLENOL ) 325 MG tablet Take 2 tablets (650 mg total) by mouth every 4 (four) hours as needed for mild pain.     Ascorbic Acid (VITAMIN C PO) Take 1 tablet by mouth 3 (three) times a week.     b complex vitamins capsule Take 1 capsule by mouth 3 (three) times a week.     Cholecalciferol (VITAMIN D-3 PO) Take 1 tablet by mouth 3 (three) times a week.     levofloxacin (LEVAQUIN) 500 MG tablet Take 1 tablet by mouth daily.     rosuvastatin  (CRESTOR ) 10 MG tablet Take 1 tablet (10 mg total) by mouth daily. 30 tablet 2   No current  facility-administered medications for this visit.    REVIEW OF SYSTEMS:   .10 Point review of Systems was done is negative except as noted above.  PHYSICAL EXAMINATION: ECOG PERFORMANCE STATUS: 1 - Symptomatic but completely ambulatory  Vitals:   08/19/23 1238  BP: 116/77  Pulse: 72  Resp: 16  Temp: 97.7 F (36.5 C)  SpO2: 100%   Filed Weights   08/19/23 1238  Weight: 184 lb 12.8 oz (83.8 kg)  Body mass index is 24.38 kg/m. SABRA GENERAL:alert, in no acute distress and comfortable SKIN: no acute rashes, no significant lesions EYES: conjunctiva are pink and non-injected, sclera anicteric OROPHARYNX: MMM, no exudates, no oropharyngeal erythema or ulceration NECK: supple, no JVD LYMPH:  no palpable lymphadenopathy in the cervical, axillary or inguinal regions LUNGS: clear to auscultation b/l with normal respiratory effort HEART: regular rate & rhythm ABDOMEN:  normoactive bowel sounds , non tender, not distended. Extremity: no pedal edema  PSYCH: alert & oriented x 3 with fluent speech NEURO: no focal motor/sensory deficits   LABORATORY DATA:  I have reviewed the data as listed       Latest Ref Rng & Units 08/19/2023   11:58 AM 03/28/2023    1:49 PM 12/04/2022    8:56 AM  CBC  WBC 4.0 - 10.5 K/uL 5.3  5.5  4.6   Hemoglobin 13.0 - 17.0 g/dL 85.4  85.7  85.8   Hematocrit 39.0 - 52.0 % 41.3  41.4  41.0   Platelets 150 - 400 K/uL 196  204  198       Latest Ref Rng & Units 08/19/2023   11:58 AM 03/28/2023    1:49 PM 12/04/2022    8:56 AM  CMP  Glucose 70 - 99 mg/dL 88  81  897   BUN 6 - 20 mg/dL 13  14  14    Creatinine 0.61 - 1.24 mg/dL 9.00  8.92  9.11   Sodium 135 - 145 mmol/L 137  139  138   Potassium 3.5 - 5.1 mmol/L 4.2  4.4  4.1   Chloride 98 - 111 mmol/L 104  103  104   CO2 22 - 32 mmol/L 29  30  28    Calcium  8.9 - 10.3 mg/dL 9.9  9.5  89.8   Total Protein 6.5 - 8.1 g/dL 6.6  6.8  6.8   Total Bilirubin 0.0 - 1.2 mg/dL 0.5  0.4  0.3   Alkaline Phos 38 - 126 U/L  36  42  49   AST 15 - 41 U/L 15  14  15    ALT 0 - 44 U/L 12  10  16     Lab Results  Component Value Date   LDH 118 08/19/2023   PATHOLOGY 02/28/2022 A. Lymph node  Clinical History: Concern for recurrent lymphoma, post CT guided BX of  bulky abdominal nodal conglomeration  DIAGNOSIS:  A. LYMPH NODE, ABDOMINAL; CT-GUIDED CORE NEEDLE BIOPSY:  - DIFFUSE LARGE B-CELL LYMPHOMA, SIMILAR TO PREVIOUS.  Comment:  This case was reviewed together with the prior diagnostic biopsy  ([id number]).  Concurrent flow cytometric studies identify a  CD5-negative, CD10-negative clonal B cell population, large in size. For  further details, see scanned report in [facility]. Additional  immunohistochemical testing is deferred in favor of saving limited  tissue for potential ancillary testing.    SURGICAL PATHOLOGY 07/19/2021 A. Lymph node, right neck  Clinical History: History of malignant non Hodgkin's lymphoma after EUS  biopsy on 11/17, however sample was insufficient to subclassify for  treatment planning.  Now post US  guided bx of hypermetabolic right  supraclavicular lymph node  DIAGNOSIS:  A. LYMPH NODE, RIGHT NECK; ULTRASOUND-GUIDED BIOPSY:  - LARGE B-CELL LYMPHOMA.  - SEE COMMENT.  Comment:  Sections demonstrate sheets of large, markedly atypical cells with  prominent macronucleoli.  Mitotic activity is brisk.  Flow cytometry  demonstrates a CD5 negative, CD10 negative monoclonal B cell population.  Definitive classification is dependent on prognostically significant IHC  and FISH studies, which will be reported in an addendum.      RADIOGRAPHIC STUDIES: I have personally reviewed the radiological images as listed and agreed with the findings in the report.  No results found.  PET scan 05/23/2022: CONCLUSION: 1.  Deauville 2 as demonstrated by a significant interval decrease in size and FDG avidity of the previously described lymphadenopathy with a few residual mesenteric lymph nodes  with FDG uptake similar to blood pool. This  constitutes complete response by the [facility] classification. 2.  Ancillary CT findings as above.   PET CT scan 05/23/2022 post CAR-T cell therapy day +30 PET LYMPHOMA NON HODGKINS (W/LOW DOSE CT) Anatomical Region Laterality Modality  Abdomen -- Positron Emission Tomography (PET)  Impression 1.  Deauville 2 as demonstrated by a significant interval decrease in size and FDG avidity of the previously described lymphadenopathy with a few residual mesenteric lymph nodes with FDG uptake similar to blood pool. This constitutes complete response by the [facility] classification. 2.  Ancillary CT findings as above.   ASSESSMENT & PLAN:   Very pleasant 43 y.o. gentleman with some chronic food related bowel irregularities with  1) Stage IVA large B-cell lymphoma NOS.  Double expressor ABC type non-germinal center (activated B cell) immunophenotype. Co-expression of BCL2 and C-MYC by IHC may be associated with a more aggressive clinical course.   Immunohistochemical studies demonstrate the tumor cells to be positive  for CD20, BCL2, BCL6, MUM1, and C-MYC, and negative for CD5, CD10, CD30,  and EBER ISH.   non-germinal center (activated B cell) immunophenotype.  Co-expression of BCL2 and C-MYC by IHC may be associated with a more aggressive clinical course.   Lymphoma FISH panel shows no evidence of c-Myc rearrangement ruling out double hit or triple hit lymphoma. Patient presented with extensive abdominal and retroperitoneal lymphadenopathy, right supraclavicular lymphadenopathy and lung nodules concerning for lymphoma involvement as well as concern for small bowel involvement.  PET scan from 08/01/2022 showed minimal residual soft tissue thickening/nodularity and FDG avidity within the right eccentric small bowel mesenteric root. No findings to suggest new lymphomatous disease. Findings are consistent with Deauville 3/Complete Response (CR) per the [facility]  classification. Right upper lobe peribronchial thickening and tree-in-bud nodularity with associated FDG-avidity. This is favored to reflect infectious/inflammatory etiologies.    2) pulmonary nodules concerning for lymphoma involvement  3) 4.4 cm small bowel thickening concerning for involvement by lymphoma.  Pathology from 02/28/22: A. LYMPH NODE, ABDOMINAL; CT-GUIDED CORE NEEDLE BIOPSY:  - DIFFUSE LARGE B-CELL LYMPHOMA, SIMILAR TO PREVIOUS.   4) acute kidney injury.  Creatinine improved to 0.88 today, 12/04/22.  5) Food intolerances including gluten and dairy avoidance.  6) DLBCL s/p CAR-T Therapy  Oncology history:  First-line therapy with R-CHOP starting on 07/27/21. He received 2 cycles of R-CHOP after which treatment was switched to dose adjusted R-EPOCH for the remaining 4 cycles due to the double expressor status. Interval PET CT scan after 3 cycles on 09/21/21 showed a complete response (Deauville 2). He completed 6 total cycles of treatment on 11/13/21. 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 He had a end of treatment PET CT scan on 12/14/21 which also showed a complete response. He was placed on surveillance.   RECURRENCE Patient developed recurrent, progressively worsening abdominal pain in 01/2022 and underwent a CT scan on 02/17/22. Results showed interval development of bulky adenopathy in the abdomen, highly concerning for recurrence. PET CT 02/22/22 showed extensive mesenteric adenopathy with SUV max 10.93. He underwent a core needle bx of the mesenteric mass on 02/28/22 and results showed recurrent DLBCL, similar to previous. He was referred to us  for discussion of treatment options and consideration of CAR-T cell therapy. He received one cycle of R-GDC starting 03/02/22 for disease control prior to leukapheresis.   Second line Lymphodepleting Regimen: Fludarabine 30 mg/m2 IV daily for 3 doses and cyclophosphamide  500 mg/m2 IV daily for 3 doses.(Completed on  04/20/2022) CAR-T Product: Axicabtagene Ciloleucel /  Jeanette.04/23/2022 CRS Highest Grade: 3. Manifested as Fever and Hypotension. Last occurrence/resolution: 05/01/2022. ICANS Highest Grade: None.  WBC Recover (First Day >1,500): 05/31/2022. Platelet Recover (First Day >75,000): 05/31/2022 with last platelet transfusion given NA.   #7 history of BK Viruria Symptoms resolved. Continue adequate hydration.   #8 Prolonged Pancytopenia- now resolved. Antimicrobial prophylaxis- per transplant team.  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    Ref Range & Units 8 d ago  IGG 635 - 1741 MG/DL 602 Low   IGM 45 - 718 MG/DL <79 Low   IGA 66 - 566 MG/DL 20 Low    -Patient on IVIG monthly to reduce risk of infections in the context of hypogammaglobinemia and IgG 1 subclass deficiency as per his transplant team recommendations.   #10 Prophylaxis ID Acyclovir 800 mg BID for HSV/VZV. Levofloxacin 500 mg daily for bacterial. Fluconazole 400 mg daily for fungal. Bactrim DS 1 tab Monday, Wednesday, Friday for PCP.   #11 GERD Continue omeprazole 20 mg daily.    #12 Supplement Use Vitamin B complex, Vitamin D3/K2, Vitamin C all ok to resume now post CAR-T.   #13 Anxiety/Anticipatory Nausea Continue lorazepam  0.5 mg every 8 hours as needed.    PLAN -discussed labs results from today with the patient. -there is no labs or clinical evidence of lymphoma progression at this time. -cbc/diff, cmp and LDH WNL Recommended to continue to his Vitamin B, Vitamin C, B-Complex and Vitamin D supplements.  -completed evaluation with Dr Ladona for recurrent palpitations. -has f/u with [facility] for 18 months post Car-T f/u and likely rpt imaging in march 2025  Follow-up: RTC with Dr Onesimo with labs in 5 months   .The total time spent in the appointment was 20 minutes* .  All of the patient's  questions were answered with apparent satisfaction. The patient knows to call the clinic with any problems, questions or concerns.   Emaline Onesimo MD MS AAHIVMS [facility] Hematology/Oncology Physician [facility]  .*Total Encounter Time as defined by the Centers for Medicare and Medicaid Services includes, in addition to the face-to-face time of a patient visit (documented in the note above) non-face-to-face time: obtaining and reviewing outside history, ordering and reviewing medications, tests or procedures, care coordination (communications with other health care professionals or caregivers) and documentation in the medical record.

## 2023-10-09 ENCOUNTER — Encounter: Payer: Self-pay | Admitting: Hematology

## 2024-01-10 ENCOUNTER — Encounter: Payer: Self-pay | Admitting: Hematology

## 2024-01-17 ENCOUNTER — Ambulatory Visit: Payer: 59 | Admitting: Hematology

## 2024-01-17 ENCOUNTER — Other Ambulatory Visit: Payer: 59

## 2024-03-13 ENCOUNTER — Other Ambulatory Visit: Payer: Self-pay

## 2024-03-13 DIAGNOSIS — C8338 Diffuse large B-cell lymphoma, lymph nodes of multiple sites: Secondary | ICD-10-CM

## 2024-03-15 NOTE — Progress Notes (Signed)
 HEMATOLOGY/ONCOLOGY CLINIC NOTE  Date of Service: 03/16/2024  Patient Care Team: Zackary Megan ORN, MD as PCP - General (Family Medicine) Ladona Heinz, MD as PCP - Cardiology (Cardiology) Onesimo Emaline Brink, MD as Consulting Physician (Hematology) Transplant team at [facility] Dr. 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 He received 2 cycles of R-CHOP after which treatment was switched to dose adjusted R-EPOCH for the remaining 4 cycles due to the double expressor status. Interval PET CT scan after 3 cycles on 09/21/21 showed a complete response (Deauville 2). He completed 6 total cycles of treatment on 11/13/21. 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 He had a end of treatment PET CT scan on 12/14/21 which also showed a complete response. He was placed on surveillance.   RECURRENCE Patient developed recurrent, progressively worsening abdominal pain in 01/2022 and underwent a CT scan on 02/17/22. Results showed interval development of bulky adenopathy in the abdomen, highly concerning for recurrence. PET CT 02/22/22 showed extensive mesenteric adenopathy with SUV max 10.93. He underwent a core needle bx of the mesenteric mass on 02/28/22 and results showed recurrent DLBCL, similar to previous. He was referred to us  for discussion of treatment options and consideration of CAR-T cell therapy. He received one cycle of R-GDC starting 03/02/22 for disease control prior to leukapheresis.   Second line therapy CAR-T CELL THERAPY - Leukapheresis 03/26/22.  - Bridging: R-ICE x 1 cycle 03/28/22  - Lymphodepletion: Fludarabine 30 mg/m2 IV daily for 3 doses, cyclophosphamide  500 mg/m2 IV daily for 3 doses. (9/6 - 04/20/22) - CAR -T cell infusion: Axi-Cel (Yescarta) given 04/23/22 - CRS  Highest Grade: 3. Manifested as Fever and Hypotension. Last occurrence/resolution: 05/01/2022. - ICANS Highest Grade: None.  - WBC Recovery (First Day >1,500): 05/31/2022. - Platelet Recovery (First Day >75,000): 05/31/2022 with last platelet transfusion given NA.   HISTORY OF PRESENTING ILLNESS:  Please see previous notes for details on initial presentation   INTERVAL HISTORY:  Allen Hodge is a 43 y.o. male here for follow-up of his large B-cell lymphoma.  Patient was last seen by me on 08/19/2023 and reported some intermittent abdominal discomfort.  Patient was seen by Margrette Harlene Fret, PA-C on 10/23/2023 and had no new issues/concerns. Patient declined post CAR-T vaccines at that time due to previous concerns of adverse side effects from vaccines.   Patient is now nearly 2 years post CAR-T cell therapy.  Since his last visit.  Notes no new lumps or bumps.  No new abdominal pain or distention.  No new shortness of breath or chest pain. Does have follow-up imaging coming up in the next 2 months at [facility] with his transplant team. No infection issues.  He is not on any antibiotics at this time.. There is no chills no night sweats no unexpected weight loss no new lumps or bumps.   MEDICAL HISTORY:  Past Medical History:  Diagnosis Date   Allergy    Cancer (HCC)    skin cancer   Herniated disc    Hx of skin cancer, basal cell    Hyperlipidemia    borderline- no meds    Non Hodgkin's lymphoma (HCC)     SURGICAL HISTORY: Past Surgical History:  Procedure Laterality Date   COLONOSCOPY     last 2013  ESOPHAGOGASTRODUODENOSCOPY (EGD) WITH PROPOFOL  N/A 06/29/2021   Procedure: ESOPHAGOGASTRODUODENOSCOPY (EGD) WITH PROPOFOL ;  Surgeon: Teressa Toribio SQUIBB, MD;  Location: [facility];  Service: Endoscopy;  Laterality: N/A;   EUS N/A 06/29/2021   Procedure: UPPER ENDOSCOPIC ULTRASOUND (EUS) RADIAL;  Surgeon: Teressa Toribio SQUIBB, MD;  Location: [facility];   Service: Endoscopy;  Laterality: N/A;   FINE NEEDLE ASPIRATION N/A 06/29/2021   Procedure: FINE NEEDLE ASPIRATION (FNA) LINEAR;  Surgeon: Teressa Toribio SQUIBB, MD;  Location: [facility];  Service: Endoscopy;  Laterality: N/A;   IR IMAGING GUIDED PORT INSERTION  07/26/2021   IR REMOVAL TUN ACCESS W/ PORT W/O FL MOD SED  12/06/2022   VASECTOMY      SOCIAL HISTORY: Social History   Socioeconomic History   Marital status: Married    Spouse name: Not on file   Number of children: 1   Years of education: Not on file   Highest education level: Not on file  Occupational History   Occupation: Sales   Tobacco Use   Smoking status: Never   Smokeless tobacco: Never  Vaping Use   Vaping status: Never Used  Substance and Sexual Activity   Alcohol  use: Yes    Alcohol /week: 1.0 standard drink of alcohol     Types: 1 Glasses of wine per week    Comment: Rare    Drug use: No   Sexual activity: Not on file  Other Topics Concern   Not on file  Social History Narrative   Daily caffeine    Social Drivers of Corporate investment banker Strain: Not on file  Food Insecurity: Not on file  Transportation Needs: Not on file  Physical Activity: Not on file  Stress: Not on file  Social Connections: Not on file  Intimate Partner Violence: Not on file    FAMILY HISTORY: Family History  Problem Relation Age of Onset   Colon cancer Mother 26   Colon polyps Mother    Colon cancer Paternal Grandfather    Alzheimer's disease Paternal Grandfather    Breast cancer Maternal Aunt    Diabetes Maternal Grandmother    Prostate cancer Maternal Grandfather    Esophageal cancer Neg Hx    Rectal cancer Neg Hx    Stomach cancer Neg Hx     ALLERGIES:  is allergic to bee pollen-1000-royal jelly [nutritional supplements], gluten meal, and lactose intolerance (gi).  MEDICATIONS:  Current Outpatient Medications  Medication Sig Dispense Refill   acetaminophen  (TYLENOL ) 325 MG tablet Take 2 tablets (650 mg  total) by mouth every 4 (four) hours as needed for mild pain.     Ascorbic Acid (VITAMIN C PO) Take 1 tablet by mouth 3 (three) times a week.     b complex vitamins capsule Take 1 capsule by mouth 3 (three) times a week.     Cholecalciferol (VITAMIN D-3 PO) Take 1 tablet by mouth 3 (three) times a week.     levofloxacin (LEVAQUIN) 500 MG tablet Take 1 tablet by mouth daily.     rosuvastatin  (CRESTOR ) 10 MG tablet Take 1 tablet (10 mg total) by mouth daily. 30 tablet 2   No current facility-administered medications for this visit.    REVIEW OF SYSTEMS:   .10 Point review of Systems was done is negative except as noted above.  PHYSICAL EXAMINATION: ECOG PERFORMANCE STATUS: 1 - Symptomatic but completely ambulatory  Vitals:   03/16/24 1158  BP: 105/78  Pulse: 63  Resp: 20  Temp: 97.9 F (36.6 C)  SpO2:  99%    Filed Weights   03/16/24 1158  Weight: 190 lb 9.6 oz (86.5 kg)   Body mass index is 25.15 kg/m.  GENERAL:alert, in no acute distress and comfortable SKIN: no acute rashes, no significant lesions EYES: conjunctiva are pink and non-injected, sclera anicteric OROPHARYNX: MMM, no exudates, no oropharyngeal erythema or ulceration NECK: supple, no JVD LYMPH:  no palpable lymphadenopathy in the cervical, axillary or inguinal regions LUNGS: clear to auscultation b/l with normal respiratory effort HEART: regular rate & rhythm ABDOMEN:  normoactive bowel sounds , non tender, not distended. Extremity: no pedal edema PSYCH: alert & oriented x 3 with fluent speech NEURO: no focal motor/sensory deficits   LABORATORY DATA:  I have reviewed the data as listed       Latest Ref Rng & Units 03/16/2024   11:27 AM 08/19/2023   11:58 AM 03/28/2023    1:49 PM  CBC  WBC 4.0 - 10.5 K/uL 5.4  5.3  5.5   Hemoglobin 13.0 - 17.0 g/dL 85.5  85.4  85.7   Hematocrit 39.0 - 52.0 % 42.0  41.3  41.4   Platelets 150 - 400 K/uL 177  196  204       Latest Ref Rng & Units 03/16/2024   11:27 AM  08/19/2023   11:58 AM 03/28/2023    1:49 PM  CMP  Glucose 70 - 99 mg/dL 93  88  81   BUN 6 - 20 mg/dL 16  13  14    Creatinine 0.61 - 1.24 mg/dL 9.07  9.00  8.92   Sodium 135 - 145 mmol/L 138  137  139   Potassium 3.5 - 5.1 mmol/L 4.4  4.2  4.4   Chloride 98 - 111 mmol/L 104  104  103   CO2 22 - 32 mmol/L 29  29  30    Calcium  8.9 - 10.3 mg/dL 9.4  9.9  9.5   Total Protein 6.5 - 8.1 g/dL 6.5  6.6  6.8   Total Bilirubin 0.0 - 1.2 mg/dL 0.5  0.5  0.4   Alkaline Phos 38 - 126 U/L 48  36  42   AST 15 - 41 U/L 14  15  14    ALT 0 - 44 U/L 12  12  10     Lab Results  Component Value Date   LDH 118 08/19/2023   PATHOLOGY 02/28/2022 A. Lymph node  Clinical History: Concern for recurrent lymphoma, post CT guided BX of  bulky abdominal nodal conglomeration  DIAGNOSIS:  A. LYMPH NODE, ABDOMINAL; CT-GUIDED CORE NEEDLE BIOPSY:  - DIFFUSE LARGE B-CELL LYMPHOMA, SIMILAR TO PREVIOUS.  Comment:  This case was reviewed together with the prior diagnostic biopsy  ([id number]).  Concurrent flow cytometric studies identify a  CD5-negative, CD10-negative clonal B cell population, large in size. For  further details, see scanned report in [facility]. Additional  immunohistochemical testing is deferred in favor of saving limited  tissue for potential ancillary testing.    SURGICAL PATHOLOGY 07/19/2021 A. Lymph node, right neck  Clinical History: History of malignant non Hodgkin's lymphoma after EUS  biopsy on 11/17, however sample was insufficient to subclassify for  treatment planning.  Now post US  guided bx of hypermetabolic right  supraclavicular lymph node  DIAGNOSIS:  A. LYMPH NODE, RIGHT NECK; ULTRASOUND-GUIDED BIOPSY:  - LARGE B-CELL LYMPHOMA.  - SEE COMMENT.  Comment:  Sections demonstrate sheets of large, markedly atypical cells with  prominent macronucleoli.  Mitotic activity is brisk.  Flow cytometry  demonstrates a CD5 negative, CD10 negative monoclonal B cell population.  Definitive  classification is dependent on prognostically significant IHC  and FISH studies, which will be reported in an addendum.      RADIOGRAPHIC STUDIES: I have personally reviewed the radiological images as listed and agreed with the findings in the report.  No results found.  PET scan 05/23/2022: CONCLUSION: 1.  Deauville 2 as demonstrated by a significant interval decrease in size and FDG avidity of the previously described lymphadenopathy with a few residual mesenteric lymph nodes with FDG uptake similar to blood pool. This constitutes complete response by the [facility] classification. 2.  Ancillary CT findings as above.   PET CT scan 05/23/2022 post CAR-T cell therapy day +30 PET LYMPHOMA NON HODGKINS (W/LOW DOSE CT) Anatomical Region Laterality Modality  Abdomen -- Positron Emission Tomography (PET)  Impression 1.  Deauville 2 as demonstrated by a significant interval decrease in size and FDG avidity of the previously described lymphadenopathy with a few residual mesenteric lymph nodes with FDG uptake similar to blood pool. This constitutes complete response by the [facility] classification. 2.  Ancillary CT findings as above.   ASSESSMENT & PLAN:   Very pleasant 43 y.o. gentleman with some chronic food related bowel irregularities with  1) Stage IVA large B-cell lymphoma NOS.  Double expressor ABC type non-germinal center (activated B cell) immunophenotype. Co-expression of BCL2 and C-MYC by IHC may be associated with a more aggressive clinical course.   Immunohistochemical studies demonstrate the tumor cells to be positive  for CD20, BCL2, BCL6, MUM1, and C-MYC, and negative for CD5, CD10, CD30,  and EBER ISH.   non-germinal center (activated B cell) immunophenotype.  Co-expression of BCL2 and C-MYC by IHC may be associated with a more aggressive clinical course.   Lymphoma FISH panel shows no evidence of c-Myc rearrangement ruling out double hit or triple hit lymphoma. Patient  presented with extensive abdominal and retroperitoneal lymphadenopathy, right supraclavicular lymphadenopathy and lung nodules concerning for lymphoma involvement as well as concern for small bowel involvement.  PET scan from 08/01/2022 showed minimal residual soft tissue thickening/nodularity and FDG avidity within the right eccentric small bowel mesenteric root. No findings to suggest new lymphomatous disease. Findings are consistent with Deauville 3/Complete Response (CR) per the [facility] classification. Right upper lobe peribronchial thickening and tree-in-bud nodularity with associated FDG-avidity. This is favored to reflect infectious/inflammatory etiologies.    2) pulmonary nodules concerning for lymphoma involvement  3) 4.4 cm small bowel thickening concerning for involvement by lymphoma.  Pathology from 02/28/22: A. LYMPH NODE, ABDOMINAL; CT-GUIDED CORE NEEDLE BIOPSY:  - DIFFUSE LARGE B-CELL LYMPHOMA, SIMILAR TO PREVIOUS.   4) acute kidney injury.  Creatinine improved to 0.88 today, 12/04/22.  5) Food intolerances including gluten and dairy avoidance.  6) DLBCL s/p CAR-T Therapy  Oncology history:  First-line therapy with R-CHOP starting on 07/27/21. He received 2 cycles of R-CHOP after which treatment was switched to dose adjusted R-EPOCH for the remaining 4 cycles due to the double expressor status. Interval PET CT scan after 3 cycles on 09/21/21 showed a complete response (Deauville 2). He completed 6 total cycles of treatment on 11/13/21. 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 He had a end of treatment PET CT scan on 12/14/21 which also showed a complete response. He was placed on surveillance.   RECURRENCE Patient developed recurrent, progressively worsening abdominal pain in 01/2022 and underwent a CT scan on 02/17/22. Results showed interval development of  bulky adenopathy in the abdomen, highly concerning for recurrence. PET CT 02/22/22 showed extensive  mesenteric adenopathy with SUV max 10.93. He underwent a core needle bx of the mesenteric mass on 02/28/22 and results showed recurrent DLBCL, similar to previous. He was referred to us  for discussion of treatment options and consideration of CAR-T cell therapy. He received one cycle of R-GDC starting 03/02/22 for disease control prior to leukapheresis.   Second line Lymphodepleting Regimen: Fludarabine 30 mg/m2 IV daily for 3 doses and cyclophosphamide  500 mg/m2 IV daily for 3 doses.(Completed on 04/20/2022) CAR-T Product: Axicabtagene Ciloleucel / Yescarta.04/23/2022 CRS Highest Grade: 3. Manifested as Fever and Hypotension. Last occurrence/resolution: 05/01/2022. ICANS Highest Grade: None.  WBC Recover (First Day >1,500): 05/31/2022. Platelet Recover (First Day >75,000): 05/31/2022 with last platelet transfusion given NA.   #7 history of BK Viruria Symptoms resolved. Continue adequate hydration.   #8 Prolonged Pancytopenia- now resolved. Antimicrobial prophylaxis- per transplant team.  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    Ref Range & Units 8 d ago  IGG 635 - 1741 MG/DL 602 Low   IGM 45 - 718 MG/DL <79 Low   IGA 66 - 566 MG/DL 20 Low    -Patient on IVIG monthly to reduce risk of infections in the context of hypogammaglobinemia and IgG 1 subclass deficiency as per his transplant team recommendations.   #10 Prophylaxis ID Acyclovir 800 mg BID for HSV/VZV. Levofloxacin 500 mg daily for bacterial. Fluconazole 400 mg daily for fungal. Bactrim DS 1 tab Monday, Wednesday, Friday for PCP.   #11 GERD Continue omeprazole 20 mg daily.    #12 Supplement Use Vitamin B complex, Vitamin D3/K2, Vitamin C all ok to resume now post CAR-T.   #13 Anxiety/Anticipatory Nausea Continue lorazepam  0.5 mg every 8 hours as needed.    PLAN  -discussed lab results from today,  03/16/2024, in detail with patient. Patient CBC today is within normal limits with a hemoglobin of 14.4 WBC count of 5.4k and platelets of 177k. CMP within normal limits LDH within normal limits at 111 Chest abdomen pelvis and CT neck 2025 and showed no evidence of disease progression.  His 2-year imaging repeat is in September with the transplant team at [facility]. We shall see him subsequently in about 6 months from now that is 3 months after his follow-up with his transplant team. Discussed infection precautions. Discussed patient's concerns about vaccinations post car T treatment.  Follow-up: RTC with dr Onesimo with labs in 6 months  The total time spent in the appointment was 21 minutes* .  All of the patient's questions were answered with apparent satisfaction. The patient knows to call the clinic with any problems, questions or concerns.   Emaline Onesimo MD MS AAHIVMS [facility] Hematology/Oncology Physician [facility]  .*Total Encounter Time as defined by the Centers for Medicare and Medicaid Services includes, in addition to the face-to-face time of a patient visit (documented in the note above) non-face-to-face time: obtaining and reviewing outside history, ordering and reviewing medications, tests or procedures, care coordination (communications with other health care professionals or caregivers) and documentation in the medical record.    I,Mitra Faeizi,acting as a Neurosurgeon for Emaline Onesimo, MD.,have documented all relevant documentation on the behalf of Emaline Onesimo, MD,as directed by  Emaline Onesimo, MD while in the presence of Emaline Onesimo, MD.  .I have reviewed the  above documentation for accuracy and completeness, and I agree with the above. .Belton Peplinski Kishore Mauriah Mcmillen MD

## 2024-03-16 ENCOUNTER — Inpatient Hospital Stay: Admitting: Hematology

## 2024-03-16 ENCOUNTER — Inpatient Hospital Stay: Attending: Hematology

## 2024-03-16 VITALS — BP 105/78 | HR 63 | Temp 97.9°F | Resp 20 | Wt 190.6 lb

## 2024-03-16 DIAGNOSIS — C8338 Diffuse large B-cell lymphoma, lymph nodes of multiple sites: Secondary | ICD-10-CM

## 2024-03-16 DIAGNOSIS — Z8042 Family history of malignant neoplasm of prostate: Secondary | ICD-10-CM | POA: Diagnosis not present

## 2024-03-16 DIAGNOSIS — Z800 Family history of malignant neoplasm of digestive organs: Secondary | ICD-10-CM | POA: Diagnosis not present

## 2024-03-16 DIAGNOSIS — C8190 Hodgkin lymphoma, unspecified, unspecified site: Secondary | ICD-10-CM | POA: Diagnosis present

## 2024-03-16 DIAGNOSIS — Z83719 Family history of colon polyps, unspecified: Secondary | ICD-10-CM | POA: Diagnosis not present

## 2024-03-16 DIAGNOSIS — Z79899 Other long term (current) drug therapy: Secondary | ICD-10-CM | POA: Insufficient documentation

## 2024-03-16 DIAGNOSIS — Z803 Family history of malignant neoplasm of breast: Secondary | ICD-10-CM | POA: Insufficient documentation

## 2024-03-16 LAB — CMP (CANCER CENTER ONLY)
ALT: 12 U/L (ref 0–44)
AST: 14 U/L — ABNORMAL LOW (ref 15–41)
Albumin: 4.6 g/dL (ref 3.5–5.0)
Alkaline Phosphatase: 48 U/L (ref 38–126)
Anion gap: 5 (ref 5–15)
BUN: 16 mg/dL (ref 6–20)
CO2: 29 mmol/L (ref 22–32)
Calcium: 9.4 mg/dL (ref 8.9–10.3)
Chloride: 104 mmol/L (ref 98–111)
Creatinine: 0.92 mg/dL (ref 0.61–1.24)
GFR, Estimated: >60 mL/min (ref >60)
Glucose, Bld: 93 mg/dL (ref 70–99)
Potassium: 4.4 mmol/L (ref 3.5–5.1)
Sodium: 138 mmol/L (ref 135–145)
Total Bilirubin: 0.5 mg/dL (ref 0.0–1.2)
Total Protein: 6.5 g/dL (ref 6.5–8.1)

## 2024-03-16 LAB — CBC WITH DIFFERENTIAL (CANCER CENTER ONLY)
Abs Immature Granulocytes: 0.01 K/uL (ref 0.00–0.07)
Basophils Absolute: 0 K/uL (ref 0.0–0.1)
Basophils Relative: 1 %
Eosinophils Absolute: 0.1 K/uL (ref 0.0–0.5)
Eosinophils Relative: 2 %
HCT: 42 % (ref 39.0–52.0)
Hemoglobin: 14.4 g/dL (ref 13.0–17.0)
Immature Granulocytes: 0 %
Lymphocytes Relative: 35 %
Lymphs Abs: 1.9 K/uL (ref 0.7–4.0)
MCH: 31 pg (ref 26.0–34.0)
MCHC: 34.3 g/dL (ref 30.0–36.0)
MCV: 90.3 fL (ref 80.0–100.0)
Monocytes Absolute: 0.4 K/uL (ref 0.1–1.0)
Monocytes Relative: 8 %
Neutro Abs: 3 K/uL (ref 1.7–7.7)
Neutrophils Relative %: 54 %
Platelet Count: 177 K/uL (ref 150–400)
RBC: 4.65 MIL/uL (ref 4.22–5.81)
RDW: 13 % (ref 11.5–15.5)
WBC Count: 5.4 K/uL (ref 4.0–10.5)
nRBC: 0 % (ref 0.0–0.2)

## 2024-03-16 LAB — LACTATE DEHYDROGENASE: LDH: 111 U/L (ref 98–192)

## 2024-03-22 ENCOUNTER — Encounter: Payer: Self-pay | Admitting: Hematology

## 2024-09-07 ENCOUNTER — Encounter: Payer: Self-pay | Admitting: Hematology

## 2024-09-10 ENCOUNTER — Other Ambulatory Visit: Payer: Self-pay

## 2024-09-10 DIAGNOSIS — C8338 Diffuse large B-cell lymphoma, lymph nodes of multiple sites: Secondary | ICD-10-CM

## 2024-09-10 DIAGNOSIS — D801 Nonfamilial hypogammaglobulinemia: Secondary | ICD-10-CM

## 2024-09-14 ENCOUNTER — Inpatient Hospital Stay: Admitting: Hematology

## 2024-09-14 ENCOUNTER — Inpatient Hospital Stay

## 2024-09-17 ENCOUNTER — Inpatient Hospital Stay

## 2024-09-17 ENCOUNTER — Inpatient Hospital Stay: Admitting: Hematology

## 2024-09-17 VITALS — BP 98/69 | HR 83 | Temp 97.9°F | Resp 20 | Wt 198.5 lb

## 2024-09-17 DIAGNOSIS — D801 Nonfamilial hypogammaglobulinemia: Secondary | ICD-10-CM

## 2024-09-17 DIAGNOSIS — C8338 Diffuse large B-cell lymphoma, lymph nodes of multiple sites: Secondary | ICD-10-CM

## 2024-09-17 LAB — CMP (CANCER CENTER ONLY)
ALT: 15 U/L (ref 0–44)
AST: 21 U/L (ref 15–41)
Albumin: 4.9 g/dL (ref 3.5–5.0)
Alkaline Phosphatase: 60 U/L (ref 38–126)
Anion gap: 12 (ref 5–15)
BUN: 14 mg/dL (ref 6–20)
CO2: 26 mmol/L (ref 22–32)
Calcium: 10.2 mg/dL (ref 8.9–10.3)
Chloride: 102 mmol/L (ref 98–111)
Creatinine: 1.26 mg/dL — ABNORMAL HIGH (ref 0.61–1.24)
GFR, Estimated: >60 mL/min
Glucose, Bld: 82 mg/dL (ref 70–99)
Potassium: 4.4 mmol/L (ref 3.5–5.1)
Sodium: 140 mmol/L (ref 135–145)
Total Bilirubin: 0.4 mg/dL (ref 0.0–1.2)
Total Protein: 7.3 g/dL (ref 6.5–8.1)

## 2024-09-17 LAB — CBC WITH DIFFERENTIAL (CANCER CENTER ONLY)
Abs Immature Granulocytes: 0.02 10*3/uL (ref 0.00–0.07)
Basophils Absolute: 0 10*3/uL (ref 0.0–0.1)
Basophils Relative: 1 %
Eosinophils Absolute: 0.1 10*3/uL (ref 0.0–0.5)
Eosinophils Relative: 1 %
HCT: 45.2 % (ref 39.0–52.0)
Hemoglobin: 15.7 g/dL (ref 13.0–17.0)
Immature Granulocytes: 0 %
Lymphocytes Relative: 23 %
Lymphs Abs: 1.7 10*3/uL (ref 0.7–4.0)
MCH: 31.9 pg (ref 26.0–34.0)
MCHC: 34.7 g/dL (ref 30.0–36.0)
MCV: 91.9 fL (ref 80.0–100.0)
Monocytes Absolute: 0.5 10*3/uL (ref 0.1–1.0)
Monocytes Relative: 6 %
Neutro Abs: 4.9 10*3/uL (ref 1.7–7.7)
Neutrophils Relative %: 69 %
Platelet Count: 183 10*3/uL (ref 150–400)
RBC: 4.92 MIL/uL (ref 4.22–5.81)
RDW: 13 % (ref 11.5–15.5)
WBC Count: 7.2 10*3/uL (ref 4.0–10.5)
nRBC: 0 % (ref 0.0–0.2)

## 2024-09-17 LAB — LACTATE DEHYDROGENASE: LDH: 159 U/L (ref 105–235)

## 2024-09-17 NOTE — Progress Notes (Incomplete)
 " HEMATOLOGY ONCOLOGY PROGRESS NOTE  Date of service: 09/17/2024  Patient Care Team: Zackary Megan ORN, MD as PCP - General (Family Medicine) Ladona Heinz, MD as PCP - Cardiology (Cardiology) Onesimo Emaline Brink, MD as Consulting Physician (Hematology) Transplant team at [facility] Dr. 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 He received 2 cycles of R-CHOP after which treatment was switched to dose adjusted R-EPOCH for the remaining 4 cycles due to the double expressor status. Interval PET CT scan after 3 cycles on 09/21/21 showed a complete response (Deauville 2). He completed 6 total cycles of treatment on 11/13/21. 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 He had a end of treatment PET CT scan on 12/14/21 which also showed a complete response. He was placed on surveillance.    RECURRENCE Patient developed recurrent, progressively worsening abdominal pain in 01/2022 and underwent a CT scan on 02/17/22. Results showed interval development of bulky adenopathy in the abdomen, highly concerning for recurrence. PET CT 02/22/22 showed extensive mesenteric adenopathy with SUV max 10.93. He underwent a core needle bx of the mesenteric mass on 02/28/22 and results showed recurrent DLBCL, similar to previous. He was referred to us  for discussion of treatment options and consideration of CAR-T cell therapy. He received one cycle of R-GDC starting 03/02/22 for disease control prior to leukapheresis.    Second line therapy CAR-T CELL THERAPY - Leukapheresis 03/26/22.  - Bridging: R-ICE x 1 cycle 03/28/22  - Lymphodepletion: Fludarabine 30 mg/m2 IV daily for 3 doses, cyclophosphamide  500 mg/m2 IV  daily for 3 doses. (9/6 - 04/20/22) - CAR -T cell infusion: Axi-Cel (Yescarta) given 04/23/22 - CRS Highest Grade: 3. Manifested as Fever and Hypotension. Last occurrence/resolution: 05/01/2022. - ICANS Highest Grade: None.  - WBC Recovery (First Day >1,500): 05/31/2022. - Platelet Recovery (First Day >75,000): 05/31/2022 with last platelet transfusion given NA.  Oncology History  Diffuse large B-cell lymphoma of lymph nodes of multiple regions (HCC)  07/21/2021 Initial Diagnosis   Diffuse large B-cell lymphoma of lymph nodes of multiple regions (HCC)   07/27/2021 - 08/19/2021 Chemotherapy   Patient is on Treatment Plan : NON-HODGKINS LYMPHOMA R-CHOP q21d     09/04/2021 Cancer Staging   Staging form: Hodgkin and Non-Hodgkin Lymphoma, AJCC 8th Edition - Clinical: Stage IV (Diffuse large B-cell lymphoma) - Signed by Onesimo Emaline Brink, MD on 09/04/2021 Stage prefix: Initial diagnosis   09/11/2021 - 11/16/2021 Chemotherapy   Patient is on Treatment Plan : IP NON-HODGKINS LYMPHOMA EPOCH q21d     09/18/2021 - 11/20/2021 Chemotherapy   Patient is on Treatment Plan : NON-HODGKINS LYMPHOMA Rituximab  q21d       INTERVAL HISTORY: Allen Hodge is a 44 y.o. male who is here today for continued evaluation and management of ***. {ELcompanions/ambulations (Optional):33762} he was last seen by me on 03/16/2024; at the time he did not have any concerns and was doing well.    Today, he says   Finished 2 yrs sept cart Denies any  [x]  new infection issues,  [x]  fevers/chills,  [x]  drenching night sweats,  [x]  unexpected weight change,  []  back pain,  []  chest pain,  []  headaches,  [x]  abdominal pain/distension,  [x]  new bone pains,  [  x] new lumps/bumps,  []  leg swelling,  []  bleeding issues (nose bleeds, gum bleeds, abnormal/spontaneous bruising),  []  nausea/vomiting,  []  diarrhea,  []  constipation,  [x]  bowel/urinary changes, []  SOB,  []  change in breathing   RTC in 4-5 months, and then 6  months afterwards REVIEW OF SYSTEMS:   10 Point review of systems of done and is negative except as noted above.  MEDICAL HISTORY Past Medical History:  Diagnosis Date   Allergy    Cancer (HCC)    skin cancer   Herniated disc    Hx of skin cancer, basal cell    Hyperlipidemia    borderline- no meds    Non Hodgkin's lymphoma (HCC)     IMMUNIZATION HISTORY  There is no immunization history on file for this patient.  SURGICAL HISTORY Past Surgical History:  Procedure Laterality Date   COLONOSCOPY     last 2013   ESOPHAGOGASTRODUODENOSCOPY (EGD) WITH PROPOFOL  N/A 06/29/2021   Procedure: ESOPHAGOGASTRODUODENOSCOPY (EGD) WITH PROPOFOL ;  Surgeon: Teressa Toribio SQUIBB, MD;  Location: [facility];  Service: Endoscopy;  Laterality: N/A;   EUS N/A 06/29/2021   Procedure: UPPER ENDOSCOPIC ULTRASOUND (EUS) RADIAL;  Surgeon: Teressa Toribio SQUIBB, MD;  Location: [facility];  Service: Endoscopy;  Laterality: N/A;   FINE NEEDLE ASPIRATION N/A 06/29/2021   Procedure: FINE NEEDLE ASPIRATION (FNA) LINEAR;  Surgeon: Teressa Toribio SQUIBB, MD;  Location: [facility];  Service: Endoscopy;  Laterality: N/A;   IR IMAGING GUIDED PORT INSERTION  07/26/2021   IR REMOVAL TUN ACCESS W/ PORT W/O FL MOD SED  12/06/2022   VASECTOMY      SOCIAL HISTORY Social History[1]  Social History   Social History Narrative   Daily caffeine    SOCIAL DRIVERS OF HEALTH SDOH Screenings   Tobacco Use: Low Risk (04/22/2024)   Received from [facility]    FAMILY HISTORY Family History  Problem Relation Age of Onset   Colon cancer Mother 36   Colon polyps Mother    Colon cancer Paternal Grandfather    Alzheimer's disease Paternal Grandfather    Breast cancer Maternal Aunt    Diabetes Maternal Grandmother    Prostate cancer Maternal Grandfather    Esophageal cancer Neg Hx    Rectal cancer Neg Hx    Stomach cancer Neg Hx     ALLERGIES: is allergic to bee pollen-1000-royal jelly [nutritional supplements], gluten meal,  and lactose intolerance (gi).  MEDICATIONS  Current Outpatient Medications  Medication Sig Dispense Refill   acetaminophen  (TYLENOL ) 325 MG tablet Take 2 tablets (650 mg total) by mouth every 4 (four) hours as needed for mild pain.     Ascorbic Acid (VITAMIN C PO) Take 1 tablet by mouth 3 (three) times a week.     b complex vitamins capsule Take 1 capsule by mouth 3 (three) times a week.     Cholecalciferol (VITAMIN D-3 PO) Take 1 tablet by mouth 3 (three) times a week.     No current facility-administered medications for this visit.     PHYSICAL EXAMINATION: ECOG PERFORMANCE STATUS: {CHL ONC ECOG ED:8845999799} VITALS: Vitals:   09/17/24 1154  BP: 98/69  Pulse: 83  Resp: 20  Temp: 97.9 F (36.6 C)  SpO2: 100%   Filed Weights   09/17/24 1154  Weight: 198 lb 8 oz (90 kg)   Body mass index is 26.19 kg/m.  GENERAL: alert, in no acute distress and comfortable SKIN: no acute rashes, no significant lesions EYES: conjunctiva are pink and non-injected, sclera  anicteric OROPHARYNX: MMM, no exudates, no oropharyngeal erythema or ulceration NECK: supple, no JVD LYMPH:  no palpable lymphadenopathy in the cervical, axillary or inguinal regions LUNGS: clear to auscultation b/l with normal respiratory effort HEART: regular rate & rhythm ABDOMEN:  normoactive bowel sounds , non tender, not distended, no hepatosplenomegaly Extremity: no pedal edema PSYCH: alert & oriented x 3 with fluent speech NEURO: no focal motor/sensory deficits  LABORATORY DATA:   I have reviewed the data as listed     Latest Ref Rng & Units 09/17/2024   10:55 AM 03/16/2024   11:27 AM 08/19/2023   11:58 AM  CBC EXTENDED  WBC 4.0 - 10.5 K/uL 7.2  5.4  5.3   RBC 4.22 - 5.81 MIL/uL 4.92  4.65  4.46   Hemoglobin 13.0 - 17.0 g/dL 84.2  85.5  85.4   HCT 39.0 - 52.0 % 45.2  42.0  41.3   Platelets 150 - 400 K/uL 183  177  196   NEUT# 1.7 - 7.7 K/uL 4.9  3.0  2.9   Lymph# 0.7 - 4.0 K/uL 1.7  1.9  1.9     -  Discussed lab results on 09/17/2024 in detail with patient: {ELGKCBCMK2:34678} {RFE:65320} {ELGKOtherLabs (Optional):33764}  {ELGK_Labs:34608}     Latest Ref Rng & Units 09/17/2024   10:55 AM 03/16/2024   11:27 AM 08/19/2023   11:58 AM  CMP  Glucose 70 - 99 mg/dL 82  93  88   BUN 6 - 20 mg/dL 14  16  13    Creatinine 0.61 - 1.24 mg/dL 8.73  9.07  9.00   Sodium 135 - 145 mmol/L 140  138  137   Potassium 3.5 - 5.1 mmol/L 4.4  4.4  4.2   Chloride 98 - 111 mmol/L 102  104  104   CO2 22 - 32 mmol/L 26  29  29    Calcium  8.9 - 10.3 mg/dL 89.7  9.4  9.9   Total Protein 6.5 - 8.1 g/dL 7.3  6.5  6.6   Total Bilirubin 0.0 - 1.2 mg/dL 0.4  0.5  0.5   Alkaline Phos 38 - 126 U/L 60  48  36   AST 15 - 41 U/L 21  14  15    ALT 0 - 44 U/L 15  12  12     Super sensitive to weather changes - joint pain, muscle aches.   PREVIOUS STUDIES:  04/22/2024   04/22/2024   PATHOLOGY 02/28/2022 A. Lymph node  Clinical History: Concern for recurrent lymphoma, post CT guided BX of bulky abdominal nodal conglomeration   DIAGNOSIS:  A. LYMPH NODE, ABDOMINAL; CT-GUIDED CORE NEEDLE BIOPSY:  - DIFFUSE LARGE B-CELL LYMPHOMA, SIMILAR TO PREVIOUS.   Comment: This case was reviewed together with the prior diagnostic biopsy ([id number]).  Concurrent flow cytometric studies identify a CD5-negative, CD10-negative clonal B cell population, large in size. For further details, see scanned report in [facility]. Additional immunohistochemical testing is deferred in favor of saving limited tissue for potential ancillary testing.   SURGICAL PATHOLOGY 07/19/2021 A. Lymph node, right neck  Clinical History: History of malignant non Hodgkin's lymphoma after EUS biopsy on 11/17, however sample was insufficient to subclassify for treatment planning.  Now post US  guided bx of hypermetabolic right supraclavicular lymph node   DIAGNOSIS:  A. LYMPH NODE, RIGHT NECK; ULTRASOUND-GUIDED BIOPSY:  - LARGE B-CELL LYMPHOMA.  - SEE COMMENT.    Comment: Sections demonstrate sheets of large, markedly atypical cells with prominent macronucleoli.  Mitotic activity is  brisk.  Flow cytometry demonstrates a CD5 negative, CD10 negative monoclonal B cell population. Definitive classification is dependent on prognostically significant IHC and FISH studies, which will be reported in an addendum.          PET scan 05/23/2022: CONCLUSION: 1.  Deauville 2 as demonstrated by a significant interval decrease in size and FDG avidity of the previously described lymphadenopathy with a few residual mesenteric lymph nodes with FDG uptake similar to blood pool. This constitutes complete response by the [facility] classification. 2.  Ancillary CT findings as above.     PET CT scan 05/23/2022 post CAR-T cell therapy day +30 PET LYMPHOMA NON HODGKINS (W/LOW DOSE CT) Anatomical Region Laterality Modality  Abdomen -- Positron Emission Tomography (PET)  Impression 1.  Deauville 2 as demonstrated by a significant interval decrease in size and FDG avidity of the previously described lymphadenopathy with a few residual mesenteric lymph nodes with FDG uptake similar to blood pool. This constitutes complete response by the [facility] classification. 2.  Ancillary CT findings as above.   RADIOGRAPHIC STUDIES: I have personally reviewed the radiological images as listed and agreed with the findings in the report. No results found.  ASSESSMENT & PLAN:  Very pleasant 44 y.o. male with some chronic food related bowel irregularities with   1) Stage IVA large B-cell lymphoma NOS.  Double expressor ABC type non-germinal center (activated B cell) immunophenotype. Co-expression of BCL2 and C-MYC by IHC may be associated with a more aggressive clinical course.    Immunohistochemical studies demonstrate the tumor cells to be positive  for CD20, BCL2, BCL6, MUM1, and C-MYC, and negative for CD5, CD10, CD30,  and EBER ISH.    non-germinal center (activated B cell)  immunophenotype.  Co-expression of BCL2 and C-MYC by IHC may be associated with a more aggressive clinical course.    Lymphoma FISH panel shows no evidence of c-Myc rearrangement ruling out double hit or triple hit lymphoma. Patient presented with extensive abdominal and retroperitoneal lymphadenopathy, right supraclavicular lymphadenopathy and lung nodules concerning for lymphoma involvement as well as concern for small bowel involvement.   PET scan from 08/01/2022 showed minimal residual soft tissue thickening/nodularity and FDG avidity within the right eccentric small bowel mesenteric root. No findings to suggest new lymphomatous disease. Findings are consistent with Deauville 3/Complete Response (CR) per the [facility] classification. Right upper lobe peribronchial thickening and tree-in-bud nodularity with associated FDG-avidity. This is favored to reflect infectious/inflammatory etiologies.      2) pulmonary nodules concerning for lymphoma involvement   3) 4.4 cm small bowel thickening concerning for involvement by lymphoma.  Pathology from 02/28/22: A. LYMPH NODE, ABDOMINAL; CT-GUIDED CORE NEEDLE BIOPSY:  - DIFFUSE LARGE B-CELL LYMPHOMA, SIMILAR TO PREVIOUS.    4) acute kidney injury.  Creatinine improved to 0.88 today, 12/04/22.   5) Food intolerances including gluten and dairy avoidance.   6) DLBCL s/p CAR-T Therapy   PLAN: - Discussed lab results on 09/17/2024 in detail with patient: {ELGKCBCMK2:34678} {RFE:65320} {ELGKOtherLabs (Optional):33764}  FOLLOW-UP in {WEEKS/MONTHS:30939} for labs and follow-up with Dr. Onesimo.  The total time spent in the appointment was *** minutes* .  All of the patient's questions were answered and the patient knows to call the clinic with any problems, questions, or concerns.  Emaline Onesimo MD MS AAHIVMS [facility] Hematology/Oncology Physician [facility]  *Total Encounter Time as defined by the Centers for Medicare and Medicaid Services  includes, in addition to the face-to-face time of a patient visit (documented  in the note above) non-face-to-face time: obtaining and reviewing outside history, ordering and reviewing medications, tests or procedures, care coordination (communications with other health care professionals or caregivers) and documentation in the medical record.  I,Emily Lagle,acting as a neurosurgeon for Emaline Saran, MD.,have documented all relevant documentation on the behalf of Emaline Saran, MD,as directed by  Emaline Saran, MD while in the presence of Emaline Saran, MD.  I have reviewed the above documentation for accuracy and completeness, and I agree with the above.  Emaline Saran, MD     [1]  Social History Tobacco Use   Smoking status: Never   Smokeless tobacco: Never  Vaping Use   Vaping status: Never Used  Substance Use Topics   Alcohol  use: Yes    Alcohol /week: 1.0 standard drink of alcohol     Types: 1 Glasses of wine per week    Comment: Rare    Drug use: No   "
